# Patient Record
Sex: Female | Born: 1935
Health system: Southern US, Community
[De-identification: ages and names within clinical notes are randomized; demographics above are authoritative.]

## PROBLEM LIST (undated history)

## (undated) DIAGNOSIS — F419 Anxiety disorder, unspecified: Secondary | ICD-10-CM

## (undated) DIAGNOSIS — E785 Hyperlipidemia, unspecified: Secondary | ICD-10-CM

## (undated) DIAGNOSIS — E039 Hypothyroidism, unspecified: Secondary | ICD-10-CM

## (undated) DIAGNOSIS — R002 Palpitations: Secondary | ICD-10-CM

## (undated) DIAGNOSIS — F0390 Unspecified dementia without behavioral disturbance: Secondary | ICD-10-CM

## (undated) HISTORY — DX: Palpitations: R00.2

## (undated) HISTORY — PX: ABDOMINAL HYSTERECTOMY: SHX81

## (undated) HISTORY — PX: APPENDECTOMY: SHX54

## (undated) HISTORY — PX: CHOLECYSTECTOMY: SHX55

## (undated) HISTORY — DX: Hyperlipidemia, unspecified: E78.5

## (undated) HISTORY — PX: PARTIAL HYSTERECTOMY: SHX80

## (undated) HISTORY — DX: Hypothyroidism, unspecified: E03.9

## (undated) HISTORY — DX: Anxiety disorder, unspecified: F41.9

---

## 2000-11-27 ENCOUNTER — Other Ambulatory Visit: Admission: RE | Admit: 2000-11-27 | Discharge: 2000-11-27 | Payer: Self-pay | Admitting: Dermatology

## 2000-12-07 ENCOUNTER — Encounter: Payer: Self-pay | Admitting: Internal Medicine

## 2000-12-07 ENCOUNTER — Ambulatory Visit (HOSPITAL_COMMUNITY): Admission: RE | Admit: 2000-12-07 | Discharge: 2000-12-07 | Payer: Self-pay | Admitting: Internal Medicine

## 2001-06-21 ENCOUNTER — Other Ambulatory Visit: Admission: RE | Admit: 2001-06-21 | Discharge: 2001-06-21 | Payer: Self-pay | Admitting: Family Medicine

## 2001-07-27 ENCOUNTER — Ambulatory Visit (HOSPITAL_COMMUNITY): Admission: RE | Admit: 2001-07-27 | Discharge: 2001-07-27 | Payer: Self-pay | Admitting: Family Medicine

## 2001-07-27 ENCOUNTER — Encounter: Payer: Self-pay | Admitting: Family Medicine

## 2001-08-16 ENCOUNTER — Encounter (HOSPITAL_COMMUNITY): Admission: RE | Admit: 2001-08-16 | Discharge: 2001-09-15 | Payer: Self-pay | Admitting: Rheumatology

## 2002-02-28 ENCOUNTER — Encounter (HOSPITAL_COMMUNITY): Admission: RE | Admit: 2002-02-28 | Discharge: 2002-03-30 | Payer: Self-pay | Admitting: *Deleted

## 2002-02-28 ENCOUNTER — Encounter: Payer: Self-pay | Admitting: *Deleted

## 2002-03-21 ENCOUNTER — Encounter (HOSPITAL_COMMUNITY): Admission: RE | Admit: 2002-03-21 | Discharge: 2002-04-20 | Payer: Self-pay | Admitting: Rheumatology

## 2002-05-14 ENCOUNTER — Encounter (HOSPITAL_COMMUNITY): Admission: RE | Admit: 2002-05-14 | Discharge: 2002-06-13 | Payer: Self-pay | Admitting: Orthopedic Surgery

## 2002-07-31 ENCOUNTER — Encounter: Payer: Self-pay | Admitting: Family Medicine

## 2002-07-31 ENCOUNTER — Ambulatory Visit (HOSPITAL_COMMUNITY): Admission: RE | Admit: 2002-07-31 | Discharge: 2002-07-31 | Payer: Self-pay | Admitting: Family Medicine

## 2003-02-21 ENCOUNTER — Encounter: Payer: Self-pay | Admitting: Family Medicine

## 2003-02-21 ENCOUNTER — Ambulatory Visit (HOSPITAL_COMMUNITY): Admission: RE | Admit: 2003-02-21 | Discharge: 2003-02-21 | Payer: Self-pay | Admitting: Family Medicine

## 2003-08-05 ENCOUNTER — Ambulatory Visit (HOSPITAL_COMMUNITY): Admission: RE | Admit: 2003-08-05 | Discharge: 2003-08-05 | Payer: Self-pay | Admitting: Family Medicine

## 2004-03-30 ENCOUNTER — Ambulatory Visit: Payer: Self-pay | Admitting: Family Medicine

## 2004-04-21 ENCOUNTER — Ambulatory Visit: Payer: Self-pay | Admitting: Family Medicine

## 2004-06-04 ENCOUNTER — Ambulatory Visit (HOSPITAL_COMMUNITY): Admission: RE | Admit: 2004-06-04 | Discharge: 2004-06-04 | Payer: Self-pay | Admitting: Family Medicine

## 2004-07-20 ENCOUNTER — Ambulatory Visit: Payer: Self-pay | Admitting: Family Medicine

## 2004-08-05 ENCOUNTER — Ambulatory Visit (HOSPITAL_COMMUNITY): Admission: RE | Admit: 2004-08-05 | Discharge: 2004-08-05 | Payer: Self-pay | Admitting: Family Medicine

## 2004-09-29 ENCOUNTER — Ambulatory Visit: Payer: Self-pay | Admitting: Family Medicine

## 2004-10-20 ENCOUNTER — Ambulatory Visit: Payer: Self-pay | Admitting: Family Medicine

## 2004-11-23 ENCOUNTER — Ambulatory Visit (HOSPITAL_COMMUNITY): Admission: RE | Admit: 2004-11-23 | Discharge: 2004-11-23 | Payer: Self-pay | Admitting: Family Medicine

## 2005-02-21 ENCOUNTER — Ambulatory Visit: Payer: Self-pay | Admitting: Family Medicine

## 2005-02-24 ENCOUNTER — Ambulatory Visit: Payer: Self-pay | Admitting: Family Medicine

## 2005-05-23 ENCOUNTER — Ambulatory Visit: Payer: Self-pay | Admitting: Family Medicine

## 2005-05-31 ENCOUNTER — Ambulatory Visit (HOSPITAL_COMMUNITY): Admission: RE | Admit: 2005-05-31 | Discharge: 2005-05-31 | Payer: Self-pay | Admitting: Family Medicine

## 2005-06-13 ENCOUNTER — Ambulatory Visit (HOSPITAL_COMMUNITY): Admission: RE | Admit: 2005-06-13 | Discharge: 2005-06-13 | Payer: Self-pay | Admitting: Unknown Physician Specialty

## 2005-06-17 ENCOUNTER — Ambulatory Visit: Payer: Self-pay | Admitting: Cardiology

## 2005-06-23 ENCOUNTER — Ambulatory Visit: Payer: Self-pay | Admitting: Family Medicine

## 2005-08-05 ENCOUNTER — Ambulatory Visit (HOSPITAL_COMMUNITY): Admission: RE | Admit: 2005-08-05 | Discharge: 2005-08-05 | Payer: Self-pay | Admitting: Family Medicine

## 2005-09-22 ENCOUNTER — Ambulatory Visit: Payer: Self-pay | Admitting: Family Medicine

## 2005-11-09 ENCOUNTER — Ambulatory Visit: Payer: Self-pay | Admitting: Family Medicine

## 2006-01-03 ENCOUNTER — Ambulatory Visit: Payer: Self-pay | Admitting: Family Medicine

## 2006-01-04 ENCOUNTER — Ambulatory Visit (HOSPITAL_COMMUNITY): Admission: RE | Admit: 2006-01-04 | Discharge: 2006-01-04 | Payer: Self-pay | Admitting: Family Medicine

## 2006-01-30 ENCOUNTER — Ambulatory Visit: Payer: Self-pay | Admitting: Family Medicine

## 2006-02-20 ENCOUNTER — Ambulatory Visit: Payer: Self-pay | Admitting: Family Medicine

## 2006-04-26 ENCOUNTER — Other Ambulatory Visit: Admission: RE | Admit: 2006-04-26 | Discharge: 2006-04-26 | Payer: Self-pay | Admitting: Family Medicine

## 2006-04-26 ENCOUNTER — Encounter: Payer: Self-pay | Admitting: Family Medicine

## 2006-04-26 ENCOUNTER — Ambulatory Visit: Payer: Self-pay | Admitting: Family Medicine

## 2006-06-23 ENCOUNTER — Encounter: Payer: Self-pay | Admitting: Family Medicine

## 2006-06-23 LAB — CONVERTED CEMR LAB
ALT: 12 units/L (ref 0–35)
AST: 21 units/L (ref 0–37)
Albumin: 4 g/dL (ref 3.5–5.2)
Alkaline Phosphatase: 117 units/L (ref 39–117)
BUN: 10 mg/dL (ref 6–23)
Basophils Absolute: 0 10*3/uL (ref 0.0–0.1)
Basophils Relative: 1 % (ref 0–1)
Bilirubin, Direct: 0.1 mg/dL (ref 0.0–0.3)
CO2: 27 meq/L (ref 19–32)
Calcium: 9.7 mg/dL (ref 8.4–10.5)
Chloride: 105 meq/L (ref 96–112)
Cholesterol: 195 mg/dL (ref 0–200)
Creatinine, Ser: 0.67 mg/dL (ref 0.40–1.20)
Eosinophils Absolute: 0.2 10*3/uL (ref 0.0–0.7)
Eosinophils Relative: 7 % — ABNORMAL HIGH (ref 0–5)
Glucose, Bld: 81 mg/dL (ref 70–99)
HCT: 43 % (ref 36.0–46.0)
HDL: 80 mg/dL (ref >39)
Hemoglobin: 13.8 g/dL (ref 12.0–15.0)
Indirect Bilirubin: 0.6 mg/dL (ref 0.0–0.9)
LDL Cholesterol: 102 mg/dL — ABNORMAL HIGH (ref 0–99)
Lymphocytes Relative: 45 % (ref 12–46)
Lymphs Abs: 1.6 10*3/uL (ref 0.7–3.3)
MCHC: 32.1 g/dL (ref 30.0–36.0)
MCV: 90 fL (ref 78.0–100.0)
Monocytes Absolute: 0.3 10*3/uL (ref 0.2–0.7)
Monocytes Relative: 10 % (ref 3–11)
Neutro Abs: 1.3 10*3/uL — ABNORMAL LOW (ref 1.7–7.7)
Neutrophils Relative %: 37 % — ABNORMAL LOW (ref 43–77)
Platelets: 237 10*3/uL (ref 150–400)
Potassium: 4.6 meq/L (ref 3.5–5.3)
RBC: 4.78 M/uL (ref 3.87–5.11)
RDW: 12.9 % (ref 11.5–14.0)
Sodium: 140 meq/L (ref 135–145)
TSH: 3.424 microintl units/mL (ref 0.350–5.50)
Total Bilirubin: 0.7 mg/dL (ref 0.3–1.2)
Total CHOL/HDL Ratio: 2.4
Total Protein: 6.3 g/dL (ref 6.0–8.3)
Triglycerides: 66 mg/dL (ref <150)
VLDL: 13 mg/dL (ref 0–40)
WBC: 3.5 10*3/uL — ABNORMAL LOW (ref 4.0–10.5)

## 2006-07-07 ENCOUNTER — Ambulatory Visit (HOSPITAL_COMMUNITY): Admission: RE | Admit: 2006-07-07 | Discharge: 2006-07-07 | Payer: Self-pay | Admitting: Family Medicine

## 2006-07-12 ENCOUNTER — Ambulatory Visit: Payer: Self-pay | Admitting: Family Medicine

## 2006-08-10 ENCOUNTER — Ambulatory Visit (HOSPITAL_COMMUNITY): Admission: RE | Admit: 2006-08-10 | Discharge: 2006-08-10 | Payer: Self-pay | Admitting: Family Medicine

## 2006-08-11 ENCOUNTER — Ambulatory Visit: Payer: Self-pay | Admitting: Family Medicine

## 2006-10-30 ENCOUNTER — Encounter: Payer: Self-pay | Admitting: Family Medicine

## 2006-10-30 LAB — CONVERTED CEMR LAB: TSH: 3.403 microintl units/mL (ref 0.350–5.50)

## 2006-11-23 ENCOUNTER — Ambulatory Visit: Payer: Self-pay | Admitting: Family Medicine

## 2007-02-14 ENCOUNTER — Ambulatory Visit: Payer: Self-pay | Admitting: Family Medicine

## 2007-03-01 ENCOUNTER — Encounter: Payer: Self-pay | Admitting: Family Medicine

## 2007-03-01 LAB — CONVERTED CEMR LAB: TSH: 3.952 microintl units/mL (ref 0.350–5.50)

## 2007-03-12 ENCOUNTER — Ambulatory Visit: Payer: Self-pay | Admitting: Family Medicine

## 2007-03-12 LAB — CONVERTED CEMR LAB
Basophils Absolute: 0 10*3/uL (ref 0.0–0.1)
CO2: 34 meq/L — ABNORMAL HIGH (ref 19–32)
Calcium: 9.5 mg/dL (ref 8.4–10.5)
Chloride: 102 meq/L (ref 96–112)
Eosinophils Relative: 2 % (ref 0–5)
Glucose, Bld: 94 mg/dL (ref 70–99)
HCT: 41.1 % (ref 36.0–46.0)
Hemoglobin: 13.7 g/dL (ref 12.0–15.0)
Lymphocytes Relative: 32 % (ref 12–46)
Lymphs Abs: 1.6 10*3/uL (ref 0.7–3.3)
Monocytes Absolute: 0.4 10*3/uL (ref 0.2–0.7)
Neutro Abs: 3 10*3/uL (ref 1.7–7.7)
Platelets: 213 10*3/uL (ref 150–400)
RDW: 13 % (ref 11.5–14.0)
Sodium: 141 meq/L (ref 135–145)
WBC: 5.2 10*3/uL (ref 4.0–10.5)

## 2007-03-13 ENCOUNTER — Encounter: Payer: Self-pay | Admitting: Family Medicine

## 2007-03-13 LAB — CONVERTED CEMR LAB
Bilirubin Urine: NEGATIVE
Leukocytes, UA: NEGATIVE
RBC / HPF: NONE SEEN (ref <3)
Urine Glucose: NEGATIVE mg/dL
WBC, UA: NONE SEEN cells/hpf (ref <3)
pH: 6 (ref 5.0–8.0)

## 2007-03-15 ENCOUNTER — Ambulatory Visit (HOSPITAL_COMMUNITY): Admission: RE | Admit: 2007-03-15 | Discharge: 2007-03-15 | Payer: Self-pay | Admitting: Family Medicine

## 2007-03-21 ENCOUNTER — Ambulatory Visit: Payer: Self-pay | Admitting: Family Medicine

## 2007-03-22 ENCOUNTER — Encounter: Payer: Self-pay | Admitting: Family Medicine

## 2007-04-24 ENCOUNTER — Encounter: Payer: Self-pay | Admitting: Family Medicine

## 2007-05-03 ENCOUNTER — Encounter: Payer: Self-pay | Admitting: Family Medicine

## 2007-05-03 ENCOUNTER — Other Ambulatory Visit: Admission: RE | Admit: 2007-05-03 | Discharge: 2007-05-03 | Payer: Self-pay | Admitting: Family Medicine

## 2007-05-03 ENCOUNTER — Ambulatory Visit: Payer: Self-pay | Admitting: Family Medicine

## 2007-05-03 LAB — CONVERTED CEMR LAB: Pap Smear: NORMAL

## 2007-07-03 ENCOUNTER — Encounter: Payer: Self-pay | Admitting: Family Medicine

## 2007-07-03 LAB — CONVERTED CEMR LAB
Calcium: 9.7 mg/dL (ref 8.4–10.5)
Cholesterol: 218 mg/dL — ABNORMAL HIGH (ref 0–200)
Creatinine, Ser: 0.65 mg/dL (ref 0.40–1.20)
HDL: 86 mg/dL (ref >39)
Sodium: 142 meq/L (ref 135–145)
TSH: 4.15 microintl units/mL (ref 0.350–5.50)
Total CHOL/HDL Ratio: 2.5
Triglycerides: 78 mg/dL (ref <150)

## 2007-07-11 ENCOUNTER — Ambulatory Visit: Payer: Self-pay | Admitting: Family Medicine

## 2007-07-13 ENCOUNTER — Ambulatory Visit (HOSPITAL_COMMUNITY): Admission: RE | Admit: 2007-07-13 | Discharge: 2007-07-13 | Payer: Self-pay | Admitting: Family Medicine

## 2007-08-15 ENCOUNTER — Ambulatory Visit (HOSPITAL_COMMUNITY): Admission: RE | Admit: 2007-08-15 | Discharge: 2007-08-15 | Payer: Self-pay | Admitting: Family Medicine

## 2007-10-31 ENCOUNTER — Encounter: Payer: Self-pay | Admitting: Family Medicine

## 2007-10-31 LAB — CONVERTED CEMR LAB
HDL: 79 mg/dL (ref >39)
LDL Cholesterol: 105 mg/dL — ABNORMAL HIGH (ref 0–99)
Total CHOL/HDL Ratio: 2.6
VLDL: 18 mg/dL (ref 0–40)

## 2007-11-07 DIAGNOSIS — E785 Hyperlipidemia, unspecified: Secondary | ICD-10-CM | POA: Insufficient documentation

## 2007-11-07 DIAGNOSIS — F411 Generalized anxiety disorder: Secondary | ICD-10-CM | POA: Insufficient documentation

## 2007-11-07 DIAGNOSIS — E039 Hypothyroidism, unspecified: Secondary | ICD-10-CM

## 2007-11-08 ENCOUNTER — Ambulatory Visit: Payer: Self-pay | Admitting: Family Medicine

## 2007-12-17 ENCOUNTER — Encounter: Payer: Self-pay | Admitting: Family Medicine

## 2007-12-24 ENCOUNTER — Encounter: Payer: Self-pay | Admitting: Family Medicine

## 2008-01-24 ENCOUNTER — Encounter: Payer: Self-pay | Admitting: Family Medicine

## 2008-01-30 ENCOUNTER — Ambulatory Visit: Payer: Self-pay | Admitting: Family Medicine

## 2008-01-30 DIAGNOSIS — N3090 Cystitis, unspecified without hematuria: Secondary | ICD-10-CM | POA: Insufficient documentation

## 2008-01-30 DIAGNOSIS — N390 Urinary tract infection, site not specified: Secondary | ICD-10-CM

## 2008-01-30 LAB — CONVERTED CEMR LAB
Bilirubin Urine: NEGATIVE
Glucose, Urine, Semiquant: NEGATIVE
Protein, U semiquant: NEGATIVE
pH: 7

## 2008-01-31 ENCOUNTER — Encounter: Payer: Self-pay | Admitting: Family Medicine

## 2008-01-31 LAB — CONVERTED CEMR LAB
Basophils Absolute: 0 10*3/uL (ref 0.0–0.1)
Eosinophils Absolute: 0.3 10*3/uL (ref 0.0–0.7)
Eosinophils Relative: 5 % (ref 0–5)
HCT: 42.2 % (ref 36.0–46.0)
Lymphocytes Relative: 31 % (ref 12–46)
Neutrophils Relative %: 57 % (ref 43–77)
Platelets: 215 10*3/uL (ref 150–400)
RDW: 13.1 % (ref 11.5–15.5)
TSH: 3.63 microintl units/mL (ref 0.350–4.50)

## 2008-02-29 ENCOUNTER — Telehealth: Payer: Self-pay | Admitting: Family Medicine

## 2008-03-20 ENCOUNTER — Ambulatory Visit: Payer: Self-pay | Admitting: Family Medicine

## 2008-03-20 DIAGNOSIS — R109 Unspecified abdominal pain: Secondary | ICD-10-CM | POA: Insufficient documentation

## 2008-03-20 LAB — CONVERTED CEMR LAB
Glucose, Urine, Semiquant: NEGATIVE
Nitrite: NEGATIVE
Specific Gravity, Urine: 1.02
WBC Urine, dipstick: NEGATIVE

## 2008-03-21 ENCOUNTER — Encounter: Payer: Self-pay | Admitting: Family Medicine

## 2008-03-21 LAB — CONVERTED CEMR LAB
Bilirubin Urine: NEGATIVE
Hemoglobin, Urine: NEGATIVE
Ketones, ur: NEGATIVE mg/dL
Protein, ur: NEGATIVE mg/dL
Urine Glucose: NEGATIVE mg/dL

## 2008-03-25 ENCOUNTER — Ambulatory Visit (HOSPITAL_COMMUNITY): Admission: RE | Admit: 2008-03-25 | Discharge: 2008-03-25 | Payer: Self-pay | Admitting: Family Medicine

## 2008-03-31 ENCOUNTER — Ambulatory Visit: Payer: Self-pay | Admitting: Family Medicine

## 2008-03-31 DIAGNOSIS — M549 Dorsalgia, unspecified: Secondary | ICD-10-CM | POA: Insufficient documentation

## 2008-04-23 ENCOUNTER — Encounter: Payer: Self-pay | Admitting: Family Medicine

## 2008-04-24 ENCOUNTER — Telehealth: Payer: Self-pay | Admitting: Family Medicine

## 2008-06-09 ENCOUNTER — Ambulatory Visit: Payer: Self-pay | Admitting: Family Medicine

## 2008-06-09 ENCOUNTER — Other Ambulatory Visit: Admission: RE | Admit: 2008-06-09 | Discharge: 2008-06-09 | Payer: Self-pay | Admitting: Family Medicine

## 2008-06-09 ENCOUNTER — Encounter: Payer: Self-pay | Admitting: Family Medicine

## 2008-06-09 DIAGNOSIS — N760 Acute vaginitis: Secondary | ICD-10-CM | POA: Insufficient documentation

## 2008-06-09 DIAGNOSIS — R5383 Other fatigue: Secondary | ICD-10-CM | POA: Insufficient documentation

## 2008-06-09 DIAGNOSIS — E559 Vitamin D deficiency, unspecified: Secondary | ICD-10-CM | POA: Insufficient documentation

## 2008-06-09 LAB — CONVERTED CEMR LAB
ALT: 16 units/L (ref 0–35)
AST: 23 units/L (ref 0–37)
Bilirubin, Direct: 0.1 mg/dL (ref 0.0–0.3)
Calcium: 9.5 mg/dL (ref 8.4–10.5)
Cholesterol: 228 mg/dL — ABNORMAL HIGH (ref 0–200)
Glucose, Bld: 91 mg/dL (ref 70–99)
Ketones, urine, test strip: NEGATIVE
Nitrite: NEGATIVE
Protein, U semiquant: NEGATIVE
Sodium: 145 meq/L (ref 135–145)
TSH: 4.324 microintl units/mL (ref 0.350–4.50)
Total CHOL/HDL Ratio: 2.4
Urobilinogen, UA: 0.2
pH: 7

## 2008-06-10 ENCOUNTER — Encounter: Payer: Self-pay | Admitting: Family Medicine

## 2008-06-10 LAB — CONVERTED CEMR LAB
Candida species: NEGATIVE
GC Probe Amp, Genital: NEGATIVE
Gardnerella vaginalis: NEGATIVE

## 2008-07-28 ENCOUNTER — Ambulatory Visit: Payer: Self-pay | Admitting: Family Medicine

## 2008-08-18 ENCOUNTER — Ambulatory Visit (HOSPITAL_COMMUNITY): Admission: RE | Admit: 2008-08-18 | Discharge: 2008-08-18 | Payer: Self-pay | Admitting: Family Medicine

## 2008-10-23 ENCOUNTER — Encounter: Payer: Self-pay | Admitting: Family Medicine

## 2008-10-24 ENCOUNTER — Encounter: Payer: Self-pay | Admitting: Family Medicine

## 2008-10-24 LAB — CONVERTED CEMR LAB
HDL: 93 mg/dL (ref >39)
LDL Cholesterol: 96 mg/dL (ref 0–99)
TSH: 3.329 microintl units/mL (ref 0.350–4.500)
Total CHOL/HDL Ratio: 2.2
VLDL: 11 mg/dL (ref 0–40)

## 2008-10-27 ENCOUNTER — Encounter: Payer: Self-pay | Admitting: Family Medicine

## 2008-10-31 ENCOUNTER — Ambulatory Visit: Payer: Self-pay | Admitting: Family Medicine

## 2008-10-31 DIAGNOSIS — M79609 Pain in unspecified limb: Secondary | ICD-10-CM

## 2009-01-05 ENCOUNTER — Ambulatory Visit: Payer: Self-pay | Admitting: Family Medicine

## 2009-01-05 ENCOUNTER — Ambulatory Visit (HOSPITAL_COMMUNITY): Admission: RE | Admit: 2009-01-05 | Discharge: 2009-01-05 | Payer: Self-pay | Admitting: Family Medicine

## 2009-01-05 ENCOUNTER — Telehealth: Payer: Self-pay | Admitting: Family Medicine

## 2009-01-05 DIAGNOSIS — M542 Cervicalgia: Secondary | ICD-10-CM

## 2009-01-18 ENCOUNTER — Encounter: Payer: Self-pay | Admitting: Family Medicine

## 2009-01-20 ENCOUNTER — Encounter: Payer: Self-pay | Admitting: Family Medicine

## 2009-03-06 ENCOUNTER — Ambulatory Visit: Payer: Self-pay | Admitting: Family Medicine

## 2009-03-09 ENCOUNTER — Encounter: Payer: Self-pay | Admitting: Family Medicine

## 2009-03-09 LAB — CONVERTED CEMR LAB
Lymphocytes Relative: 37 % (ref 12–46)
Neutro Abs: 2.4 10*3/uL (ref 1.7–7.7)
Neutrophils Relative %: 49 % (ref 43–77)
Platelets: 223 10*3/uL (ref 150–400)
RDW: 13.7 % (ref 11.5–15.5)

## 2009-04-29 ENCOUNTER — Encounter: Payer: Self-pay | Admitting: Family Medicine

## 2009-05-25 ENCOUNTER — Encounter: Payer: Self-pay | Admitting: Family Medicine

## 2009-06-02 ENCOUNTER — Ambulatory Visit: Payer: Self-pay | Admitting: Family Medicine

## 2009-06-02 LAB — CONVERTED CEMR LAB
Bilirubin Urine: NEGATIVE
Glucose, Urine, Semiquant: NEGATIVE
Ketones, urine, test strip: NEGATIVE
Protein, U semiquant: NEGATIVE

## 2009-06-03 ENCOUNTER — Encounter: Payer: Self-pay | Admitting: Family Medicine

## 2009-07-08 ENCOUNTER — Ambulatory Visit: Payer: Self-pay | Admitting: Family Medicine

## 2009-07-10 LAB — CONVERTED CEMR LAB
CO2: 26 meq/L (ref 19–32)
Calcium: 9.8 mg/dL (ref 8.4–10.5)
HDL: 90 mg/dL (ref >39)
Potassium: 4.1 meq/L (ref 3.5–5.3)
Sodium: 141 meq/L (ref 135–145)
TSH: 4.111 microintl units/mL (ref 0.350–4.500)
Total CHOL/HDL Ratio: 2.5
VLDL: 19 mg/dL (ref 0–40)

## 2009-08-14 ENCOUNTER — Ambulatory Visit: Payer: Self-pay | Admitting: Family Medicine

## 2009-08-14 DIAGNOSIS — I491 Atrial premature depolarization: Secondary | ICD-10-CM

## 2009-08-14 DIAGNOSIS — J019 Acute sinusitis, unspecified: Secondary | ICD-10-CM | POA: Insufficient documentation

## 2009-08-25 ENCOUNTER — Ambulatory Visit (HOSPITAL_COMMUNITY): Admission: RE | Admit: 2009-08-25 | Discharge: 2009-08-25 | Payer: Self-pay | Admitting: Family Medicine

## 2009-09-10 ENCOUNTER — Telehealth: Payer: Self-pay | Admitting: Family Medicine

## 2009-09-10 ENCOUNTER — Ambulatory Visit: Payer: Self-pay | Admitting: Family Medicine

## 2009-09-10 DIAGNOSIS — R079 Chest pain, unspecified: Secondary | ICD-10-CM

## 2009-09-11 LAB — CONVERTED CEMR LAB
Basophils Absolute: 0 10*3/uL (ref 0.0–0.1)
Basophils Relative: 1 % (ref 0–1)
Calcium: 9.2 mg/dL (ref 8.4–10.5)
Cholesterol: 200 mg/dL (ref 0–200)
Glucose, Bld: 86 mg/dL (ref 70–99)
HDL: 91 mg/dL (ref >39)
Hemoglobin: 13.3 g/dL (ref 12.0–15.0)
Lymphocytes Relative: 36 % (ref 12–46)
MCHC: 32.4 g/dL (ref 30.0–36.0)
Monocytes Absolute: 0.3 10*3/uL (ref 0.1–1.0)
Neutro Abs: 1.8 10*3/uL (ref 1.7–7.7)
Neutrophils Relative %: 47 % (ref 43–77)
Platelets: 186 10*3/uL (ref 150–400)
Potassium: 4 meq/L (ref 3.5–5.3)
RDW: 13.4 % (ref 11.5–15.5)
Sodium: 142 meq/L (ref 135–145)
Total CHOL/HDL Ratio: 2.2
Triglycerides: 61 mg/dL (ref <150)
VLDL: 12 mg/dL (ref 0–40)

## 2009-09-13 DIAGNOSIS — M940 Chondrocostal junction syndrome [Tietze]: Secondary | ICD-10-CM | POA: Insufficient documentation

## 2009-09-16 ENCOUNTER — Telehealth: Payer: Self-pay | Admitting: Family Medicine

## 2009-09-30 ENCOUNTER — Ambulatory Visit: Payer: Self-pay | Admitting: Family Medicine

## 2009-09-30 DIAGNOSIS — R05 Cough: Secondary | ICD-10-CM

## 2009-09-30 DIAGNOSIS — J309 Allergic rhinitis, unspecified: Secondary | ICD-10-CM

## 2009-10-09 ENCOUNTER — Ambulatory Visit: Payer: Self-pay | Admitting: Internal Medicine

## 2009-10-09 DIAGNOSIS — R011 Cardiac murmur, unspecified: Secondary | ICD-10-CM

## 2009-10-14 ENCOUNTER — Ambulatory Visit: Payer: Self-pay | Admitting: Family Medicine

## 2009-10-14 DIAGNOSIS — J209 Acute bronchitis, unspecified: Secondary | ICD-10-CM

## 2009-10-19 ENCOUNTER — Ambulatory Visit: Payer: Self-pay | Admitting: Cardiology

## 2009-10-19 ENCOUNTER — Ambulatory Visit (HOSPITAL_COMMUNITY): Admission: RE | Admit: 2009-10-19 | Discharge: 2009-10-19 | Payer: Self-pay | Admitting: Internal Medicine

## 2009-10-20 ENCOUNTER — Ambulatory Visit: Payer: Self-pay | Admitting: Cardiology

## 2009-10-27 ENCOUNTER — Telehealth: Payer: Self-pay | Admitting: Family Medicine

## 2009-10-28 ENCOUNTER — Ambulatory Visit: Payer: Self-pay | Admitting: Family Medicine

## 2009-10-28 DIAGNOSIS — J328 Other chronic sinusitis: Secondary | ICD-10-CM

## 2009-10-29 ENCOUNTER — Ambulatory Visit (HOSPITAL_COMMUNITY): Admission: RE | Admit: 2009-10-29 | Discharge: 2009-10-29 | Payer: Self-pay | Admitting: Family Medicine

## 2009-10-30 ENCOUNTER — Encounter: Payer: Self-pay | Admitting: Cardiology

## 2009-11-04 ENCOUNTER — Telehealth: Payer: Self-pay | Admitting: Family Medicine

## 2009-11-04 ENCOUNTER — Encounter: Payer: Self-pay | Admitting: Family Medicine

## 2009-11-19 ENCOUNTER — Encounter: Payer: Self-pay | Admitting: Family Medicine

## 2009-12-16 ENCOUNTER — Encounter: Payer: Self-pay | Admitting: Family Medicine

## 2010-02-11 ENCOUNTER — Encounter: Payer: Self-pay | Admitting: Family Medicine

## 2010-02-25 ENCOUNTER — Ambulatory Visit: Payer: Self-pay | Admitting: Family Medicine

## 2010-02-25 DIAGNOSIS — M25569 Pain in unspecified knee: Secondary | ICD-10-CM

## 2010-03-02 ENCOUNTER — Encounter: Payer: Self-pay | Admitting: Family Medicine

## 2010-03-05 ENCOUNTER — Ambulatory Visit: Payer: Self-pay | Admitting: Family Medicine

## 2010-03-05 ENCOUNTER — Telehealth: Payer: Self-pay | Admitting: Family Medicine

## 2010-03-05 DIAGNOSIS — N3000 Acute cystitis without hematuria: Secondary | ICD-10-CM

## 2010-03-05 LAB — CONVERTED CEMR LAB
Nitrite: NEGATIVE
Protein, U semiquant: 100
Specific Gravity, Urine: 1.015
pH: 6.5

## 2010-03-06 ENCOUNTER — Encounter: Payer: Self-pay | Admitting: Family Medicine

## 2010-03-19 ENCOUNTER — Ambulatory Visit: Payer: Self-pay | Admitting: Family Medicine

## 2010-03-19 ENCOUNTER — Ambulatory Visit (HOSPITAL_COMMUNITY)
Admission: RE | Admit: 2010-03-19 | Discharge: 2010-03-19 | Payer: Self-pay | Source: Home / Self Care | Admitting: Family Medicine

## 2010-03-19 LAB — CONVERTED CEMR LAB
BUN: 7 mg/dL (ref 6–23)
Basophils Relative: 1 % (ref 0–1)
Calcium: 9.8 mg/dL (ref 8.4–10.5)
Eosinophils Absolute: 0.6 10*3/uL (ref 0.0–0.7)
Eosinophils Relative: 12 % — ABNORMAL HIGH (ref 0–5)
Glucose, Bld: 97 mg/dL (ref 70–99)
HCT: 44.5 % (ref 36.0–46.0)
Lymphs Abs: 1.4 10*3/uL (ref 0.7–4.0)
MCHC: 33.1 g/dL (ref 30.0–36.0)
MCV: 90.1 fL (ref 78.0–100.0)
Monocytes Relative: 9 % (ref 3–12)
Neutrophils Relative %: 47 % (ref 43–77)
Platelets: 226 10*3/uL (ref 150–400)
TSH: 2.674 microintl units/mL (ref 0.350–4.500)
Vit D, 25-Hydroxy: 27 ng/mL — ABNORMAL LOW (ref 30–89)
WBC: 4.6 10*3/uL (ref 4.0–10.5)

## 2010-03-23 ENCOUNTER — Telehealth: Payer: Self-pay | Admitting: Family Medicine

## 2010-04-23 ENCOUNTER — Ambulatory Visit: Payer: Self-pay | Admitting: Family Medicine

## 2010-04-23 LAB — CONVERTED CEMR LAB
Nitrite: NEGATIVE
Specific Gravity, Urine: 1.025
Urobilinogen, UA: 0.2
WBC Urine, dipstick: NEGATIVE

## 2010-04-24 ENCOUNTER — Encounter: Payer: Self-pay | Admitting: Family Medicine

## 2010-05-12 ENCOUNTER — Ambulatory Visit
Admission: RE | Admit: 2010-05-12 | Discharge: 2010-05-12 | Payer: Self-pay | Source: Home / Self Care | Attending: Family Medicine | Admitting: Family Medicine

## 2010-05-12 ENCOUNTER — Ambulatory Visit (HOSPITAL_COMMUNITY)
Admission: RE | Admit: 2010-05-12 | Discharge: 2010-05-12 | Payer: Self-pay | Source: Home / Self Care | Attending: Family Medicine | Admitting: Family Medicine

## 2010-05-12 DIAGNOSIS — J42 Unspecified chronic bronchitis: Secondary | ICD-10-CM

## 2010-05-13 ENCOUNTER — Telehealth: Payer: Self-pay | Admitting: Family Medicine

## 2010-05-18 ENCOUNTER — Encounter: Payer: Self-pay | Admitting: Family Medicine

## 2010-05-19 ENCOUNTER — Telehealth: Payer: Self-pay | Admitting: Family Medicine

## 2010-05-28 ENCOUNTER — Telehealth (INDEPENDENT_AMBULATORY_CARE_PROVIDER_SITE_OTHER): Payer: Self-pay | Admitting: *Deleted

## 2010-05-31 ENCOUNTER — Encounter: Payer: Self-pay | Admitting: Family Medicine

## 2010-06-06 ENCOUNTER — Encounter: Payer: Self-pay | Admitting: Family Medicine

## 2010-06-06 ENCOUNTER — Encounter: Payer: Self-pay | Admitting: Cardiology

## 2010-06-15 NOTE — Assessment & Plan Note (Signed)
Summary: chest pains   Vital Signs:  Patient profile:   76 year old female Menstrual status:  hysterectomy Height:      65 inches Weight:      148.50 pounds BMI:     24.80 O2 Sat:      98 % Pulse rate:   79 / minute Resp:     16 per minute BP sitting:   122 / 72  (left arm) Cuff size:   regular  Vitals Entered By: Everitt Amber LPN (September 10, 2009 10:35 AM) CC: last night she was congested in her chest and this morning she was still stuffy and congested and she felt like her heart wasn't beating as fast as it should be. Her pulse today is 79   CC:  last night she was congested in her chest and this morning she was still stuffy and congested and she felt like her heart wasn't beating as fast as it should be. Her pulse today is 79.  History of Present Illness: Pt repors that she experienced substernal chest tightness, last night while lying in the bed , associated with sweating, she is still experiencing chest tight ness. She has no other associated symptoms .she specifically denies diaphoresis, , light headedness or nausea. There is no exertional trigger, but she does note increased exertional fatigue in recent times, which reuires that she restwhile doing her usual household chores, her spouse also notices this. She has had an irregular heartbeat for some time butis asymptomatic for the most part.  She is also reporting reproducible chest pain. She ahs recently beenunder increased stress with the new diagnosis of prostate cancer in her spouse, howevere , she states she is handling it as well as can be expected. She has noted in recent times inc allergy symptoms of nasal congestion with clear drainage and tickle in the throat.She denies any fever or chills, yellow green nasal drainage or productive cough.  Current Medications (verified): 1)  Bayer Low Strength 81 Mg  Tbec (Aspirin) .... Take 1 Tablet By Mouth Once A Day 2)  Levothyroxine Sodium 25 Mcg  Tabs (Levothyroxine Sodium) ....  Take 1 Tablet By Mouth Once A Day 3)  Calcium 500/d 500-125 Mg-Unit  Tabs (Calcium Carbonate-Vitamin D) .... Take 1 Tablet By Mouth Three Times A Day 4)  Multivitamins   Tabs (Multiple Vitamin) .... Take 1 Tablet By Mouth Once A Day 5)  Darpaz 81 Mg Tabs (Meth-Hyo-M Bl-Na Phos-Ph Sal) .... One Tab By Mouth Qid Prn 6)  Ibuprofen 800 Mg Tabs (Ibuprofen) .... Take 1 Tablet By Mouth Two Times A Day 7)  Vitamin D (Ergocalciferol) 50000 Unit Caps (Ergocalciferol) .... One Tab By Mouth Q Week 8)  Lorazepam 1 Mg Tabs (Lorazepam) .... One and Half Tab By Mouth Once Daily As Needed  Allergies (verified): 1)  ! Pcn  Review of Systems      See HPI General:  Complains of fatigue and sweats. Eyes:  Denies discharge, eye pain, and red eye. ENT:  Complains of nasal congestion and postnasal drainage; denies sinus pressure. CV:  Complains of chest pain or discomfort, leg cramps with exertion, and shortness of breath with exertion; denies difficulty breathing while lying down, lightheadness, near fainting, and swelling of feet. Resp:  Denies cough and sputum productive. GI:  Denies abdominal pain, constipation, diarrhea, nausea, and vomiting. GU:  Denies dysuria and urinary frequency. MS:  Complains of joint pain, low back pain, mid back pain, and stiffness. Derm:  Denies dryness. Neuro:  Denies headaches and seizures. Psych:  Complains of anxiety and irritability; denies depression, suicidal thoughts/plans, and thoughts of violence. Endo:  Denies cold intolerance, excessive thirst, polyuria, and weight change. Heme:  Denies abnormal bruising and bleeding. Allergy:  Complains of seasonal allergies; increased symptoms in the Spring.  Physical Exam  General:  Well-developed,well-nourished,in no acute distress; alert,appropriate and cooperative throughout examination HEENT: No facial asymmetry,  EOMI, No sinus tenderness, TM's Clear, oropharynx  pink and moist.   Chest: Clear to auscultation bilaterally.  reproducible chest wall pain at the 3rd , 4th and 5th cc junctions CVS: S1, S2, No murmurs, No S3.   Abd: Soft, Nontender.  MS: Adequate ROM spine, hips, shoulders and knees.  Ext: No edema.   CNS: CN 2-12 intact, power tone and sensation normal throughout.   Skin: Intact, no visible lesions or rashes.  Psych: Good eye contact, normal affect.  Memory intact, not anxious or depressed appearing.    Impression & Recommendations:  Problem # 1:  CHEST PAIN UNSPECIFIED (ICD-786.50) Assessment Comment Only  Orders: EKG w/ Interpretation (93000)NSabnormal t waves and ryrth, new exertional fatigue, refer to card T-Troponin I ([id number]) Cardiology Referral (Cardiology)  Problem # 2:  HYPOTHYROIDISM (ICD-244.9) Assessment: Comment Only  Her updated medication list for this problem includes:    Levothyroxine Sodium 25 Mcg Tabs (Levothyroxine sodium) .Marland Kitchen... Take 1 tablet by mouth once a day  Labs Reviewed: TSH: 4.111 (07/08/2009)    Chol: 227 (07/08/2009)   HDL: 90 (07/08/2009)   LDL: 118 (07/08/2009)   TG: 96 (07/08/2009)  Problem # 3:  DYSLIPIDEMIA (ICD-272.4) Assessment: Comment Only  Orders: T-Lipid Profile ([id number]) Cardiology Referral (Cardiology)  Labs Reviewed: SGOT: 23 (06/09/2008)   SGPT: 16 (06/09/2008)   HDL:90 (07/08/2009), 93 (10/23/2008)  LDL:118 (07/08/2009), 96 (96/29/5284)  Chol:227 (07/08/2009), 200 (10/23/2008)  Trig:96 (07/08/2009), 54 (10/23/2008)  Problem # 4:  ANXIETY, CHRONIC (ICD-300.00) Assessment: Deteriorated  Her updated medication list for this problem includes:    Lorazepam 1 Mg Tabs (Lorazepam) ..... One and half tab by mouth once daily as needed  Problem # 5:  COSTOCHONDRITIS (ICD-733.6) Assessment: Comment Only ibuprofen prescribed  Complete Medication List: 1)  Bayer Low Strength 81 Mg Tbec (Aspirin) .... Take 1 tablet by mouth once a day 2)  Levothyroxine Sodium 25 Mcg Tabs (Levothyroxine sodium) .... Take 1 tablet by mouth once a  day 3)  Calcium 500/d 500-125 Mg-unit Tabs (Calcium carbonate-vitamin d) .... Take 1 tablet by mouth three times a day 4)  Multivitamins Tabs (Multiple vitamin) .... Take 1 tablet by mouth once a day 5)  Darpaz 81 Mg Tabs (Meth-hyo-m bl-na phos-ph sal) .... One tab by mouth qid prn 6)  Ibuprofen 800 Mg Tabs (Ibuprofen) .... Take 1 tablet by mouth two times a day 7)  Vitamin D (ergocalciferol) 50000 Unit Caps (Ergocalciferol) .... One tab by mouth q week 8)  Lorazepam 1 Mg Tabs (Lorazepam) .... One and half tab by mouth once daily as needed 9)  Ibuprofen 600 Mg Tabs (Ibuprofen) .... Take 1 tablet by mouth three times a day  Other Orders: T-Basic Metabolic Panel [id number]) T-CBC w/Diff [phone])  Patient Instructions: 1)  Please schedule a follow-up appointment in 1 month. 2)  You will get  ibuprofen sent in for chest wall pain, labs today and an EKG will be done , 3)  You will be referred to cardiology, for further evaluation of increased fatigue with activity. 4)  BMP prior to visit, ICD-9: 5)  Lipid Panel prior  to visit, ICD-9: 6)  CBC w/ Diff prior to visit, ICD-9: 7)  troponin stat today Prescriptions: IBUPROFEN 600 MG TABS (IBUPROFEN) Take 1 tablet by mouth three times a day  #30 x 0   Entered and Authorized by:   Syliva Overman MD   Signed by:   Syliva Overman MD on 09/13/2009   Method used:   [facility] to        [facility] [phone]* (retail)       [address]       [place], [place]  [address]       Ph: [phone] or [phone]       Fax: [phone]   RxID:   [id number]

## 2010-06-15 NOTE — Assessment & Plan Note (Signed)
Summary: follow up 5 weeks/slj   Vital Signs:  Patient profile:   76 year old female Menstrual status:  hysterectomy Height:      65 inches Weight:      148 pounds BMI:     24.72 O2 Sat:      98 % on Room air Pulse rate:   71 / minute Pulse rhythm:   regular Resp:     16 per minute BP sitting:   124 / 70  (left arm)  Vitals Entered By: Adella Hare LPN (April 23, 2010 10:48 AM)  O2 Flow:  Room air CC: follow-up visit Is Patient Diabetic? No Pain Assessment Patient in pain? no        Primary Care Provider:  Syliva Overman MD  CC:  follow-up visit.  History of Present Illness: Reports  that she is feeling much better since her last visit. She states that the discussion with  me about dementia helped alot, and the med she is taking for her nerves is extremely beneficial  Denies recent fever or chills. Denies sinus pressure, nasal congestion , ear pain or sore throat. Denies chest congestion, or cough productive of sputum. Denies chest pain, palpitations, PND, orthopnea or leg swelling. Denies abdominal pain, nausea, vomitting, diarrhea or constipation. Denies change in bowel movements or bloody stool. Denies  joint pain, swelling, or reduced mobility. Denies headaches, vertigo, seizures. Denies depression, anxiety or insomnia. Denies  rash, lesions, or itch.     Current Medications (verified): 1)  Bayer Low Strength 81 Mg  Tbec (Aspirin) .... Take 1 Tablet By Mouth Once A Day 2)  Levothyroxine Sodium 25 Mcg  Tabs (Levothyroxine Sodium) .... Take 1 Tablet By Mouth Once A Day 3)  Calcium 500/d 500-125 Mg-Unit  Tabs (Calcium Carbonate-Vitamin D) .... Take 1 Tablet By Mouth Three Times A Day 4)  Multivitamins   Tabs (Multiple Vitamin) .... Take 1 Tablet By Mouth Once A Day 5)  Vitamin D (Ergocalciferol) 50000 Unit Caps (Ergocalciferol) .... One Tab By Mouth Q Week 6)  Lorazepam 1 Mg Tabs (Lorazepam) .... One and Half Tab By Mouth Once Daily As Needed 7)   Proventil Hfa 108 (90 Base) Mcg/act Aers (Albuterol Sulfate) .... 2 Puffs Thee Times Daily For 1 Week, Then Every 6 To 8 Hrs As Needed For Excessive Cough or Wheezing 8)  Symbicort 80-4.5 Mcg/act Aero (Budesonide-Formoterol Fumarate) .... 2inhalations Twice Daily 9)  Citalopram Hydrobromide 10 Mg Tabs (Citalopram Hydrobromide) .... Take 1 Tablet By Mouth Once A Day 10)  Ibuprofen 600 Mg Tabs (Ibuprofen) .... Take 1 Tablet By Mouth Two Times A Day  Allergies (verified): 1)  ! Pcn  Review of Systems      See HPI Eyes:  Denies discharge, eye pain, and red eye. GU:  Complains of dysuria and urinary frequency; 3 day h/o. Psych:  Complains of anxiety; denies mental problems, panic attacks, suicidal thoughts/plans, thoughts of violence, and unusual visions or sounds. Endo:  Denies cold intolerance, excessive hunger, excessive thirst, and excessive urination. Heme:  Denies abnormal bruising and bleeding. Allergy:  Complains of seasonal allergies; denies hives or rash and itching eyes.  Physical Exam  General:  Well-developed,well-nourished,in no acute distress; alert,appropriate and cooperative throughout examination HEENT: No facial asymmetry,  EOMI, No sinus tenderness, TM's Clear, oropharynx  pink and moist.   Chest: Clear to auscultation bilaterally.  CVS: S1, S2, No murmurs, No S3.   Abd: Soft, Nontender.  MS: Adequate ROM spine, hips, shoulders and knees.  Ext: No edema.   CNS: CN 2-12 intact, power tone and sensation normal throughout.   Skin: Intact, no visible lesions or rashes.  Psych: Good eye contact, normal affect.  Memory intact, not anxious or depressed appearing.    Impression & Recommendations:  Problem # 1:  ACUTE CYSTITIS (ICD-595.0) Assessment Comment Only  The following medications were removed from the medication list:    Septra Ds 800-160 Mg Tabs (Sulfamethoxazole-trimethoprim) .Marland Kitchen... Take 1 tablet by mouth two times a day  Encouraged to push clear liquids, get  enough rest, and take acetaminophen as needed. To be seen in 10 days if no improvement, sooner if worse.  Orders: Urinalysis ([id number]) T-Culture, Urine ([id number])  Problem # 2:  ALLERGIC RHINITIS (ICD-477.9) Assessment: Improved  Problem # 3:  COUGH (ICD-786.2) Assessment: Improved  Problem # 4:  ANXIETY, CHRONIC (ICD-300.00) Assessment: Improved  Her updated medication list for this problem includes:    Lorazepam 1 Mg Tabs (Lorazepam) ..... One and half tab by mouth once daily as needed    Citalopram Hydrobromide 10 Mg Tabs (Citalopram hydrobromide) .Marland Kitchen... Take 1 tablet by mouth once a day  Orders: Medicare Electronic Prescription [phone])  Problem # 5:  HYPOTHYROIDISM (ICD-244.9) Assessment: Comment Only  Her updated medication list for this problem includes:    Levothyroxine Sodium 25 Mcg Tabs (Levothyroxine sodium) .Marland Kitchen... Take 1 tablet by mouth once a day  Orders: T-TSH ([id number])  Labs Reviewed: TSH: 2.674 (03/19/2010)    Chol: 200 (09/11/2009)   HDL: 91 (09/11/2009)   LDL: 97 (09/11/2009)   TG: 61 (09/11/2009)  Complete Medication List: 1)  Bayer Low Strength 81 Mg Tbec (Aspirin) .... Take 1 tablet by mouth once a day 2)  Levothyroxine Sodium 25 Mcg Tabs (Levothyroxine sodium) .... Take 1 tablet by mouth once a day 3)  Calcium 500/d 500-125 Mg-unit Tabs (Calcium carbonate-vitamin d) .... Take 1 tablet by mouth three times a day 4)  Multivitamins Tabs (Multiple vitamin) .... Take 1 tablet by mouth once a day 5)  Vitamin D (ergocalciferol) 50000 Unit Caps (Ergocalciferol) .... One tab by mouth q week 6)  Lorazepam 1 Mg Tabs (Lorazepam) .... One and half tab by mouth once daily as needed 7)  Proventil Hfa 108 (90 Base) Mcg/act Aers (Albuterol sulfate) .... 2 puffs thee times daily for 1 week, then every 6 to 8 hrs as needed for excessive cough or wheezing 8)  Symbicort 80-4.5 Mcg/act Aero (Budesonide-formoterol fumarate) .... 2inhalations twice daily 9)   Citalopram Hydrobromide 10 Mg Tabs (Citalopram hydrobromide) .... Take 1 tablet by mouth once a day 10)  Ibuprofen 600 Mg Tabs (Ibuprofen) .... Take 1 tablet by mouth two times a day 11)  Fluconazole 150 Mg Tabs (Fluconazole) .... Take 1 tablet by mouth once a day  Patient Instructions: 1)  Please schedule a follow-up appointment in 2.5 to 3 months. 2)  I am so thankful that you are feeling much better Prescriptions: CITALOPRAM HYDROBROMIDE 10 MG TABS (CITALOPRAM HYDROBROMIDE) Take 1 tablet by mouth once a day  #30 x 2   Entered by:   Adella Hare LPN   Authorized by:   Syliva Overman MD   Signed by:   Adella Hare LPN on 28/41/3244   Method used:   [facility] to        [facility] [phone]* (retail)       [address]       [place], [place]  [address]  Ph: [phone] or [phone]       Fax: [phone]   RxID:   [id number] FLUCONAZOLE 150 MG TABS (FLUCONAZOLE) Take 1 tablet by mouth once a day  #1 x 0   Entered and Authorized by:   Syliva Overman MD   Signed by:   Syliva Overman MD on 04/23/2010   Method used:   Electronically to        [facility]. [phone]* (retail)       [address]       [place], [place]  [address]       Ph: [phone] or [phone]       Fax: [phone]   RxID:   [id number]    Orders Added: 1)  Est. Patient Level IV [99214] 2)  T-TSH [55732-20254] 3)  Urinalysis [81003-65000] 4)  T-Culture, Urine [[id number]] 5)  Medicare Electronic Prescription [G8553]    Laboratory Results   Urine Tests  Date/Time Received: April 23, 2010 1:54 PM  Date/Time Reported: April 23, 2010 1:54 PM   Routine Urinalysis   Color: yellow Appearance: Clear Glucose: negative   (Normal Range: Negative) Bilirubin: negative   (Normal Range: Negative) Ketone: negative   (Normal Range: Negative) Spec. Gravity: 1.025   (Normal Range: 1.003-1.035) Blood: small   (Normal Range: Negative) pH: 5.5    (Normal Range: 5.0-8.0) Protein: negative   (Normal Range: Negative) Urobilinogen: 0.2   (Normal Range: 0-1) Nitrite: negative   (Normal Range: Negative) Leukocyte Esterace: negative   (Normal Range: Negative)

## 2010-06-15 NOTE — Letter (Signed)
Summary: [place] [facility]  [address] [place] [address]   Phone: [phone]  Fax: [phone]      October 30, 2009 MRN: [id number]   SHAKEELA RABADAN [address] [place], [place]  [address]   Dear Ms. Mahlum,  Your test ordered by Selena Batten has been reviewed by your physician (or physician assistant) and was found to be normal or stable. Your physician (or physician assistant) felt no changes were needed at this time.  ____ Echocardiogram  ____ Cardiac Stress Test  ____ Lab Work  ____ Peripheral vascular study of arms, legs or neck  ____ CT scan or X-ray  ____ Lung or Breathing test  __X__ Other: 24 hour Holter Monitor Please continue on current medical treatment.   Thank you.  Chester Bing, MD, F.A.C.C

## 2010-06-15 NOTE — Letter (Signed)
Summary: [facility]  [facility]   Imported By: Lind Guest 01/26/2010 08:50:44  _____________________________________________________________________  External Attachment:    Type:   Image     Comment:   External Document

## 2010-06-15 NOTE — Assessment & Plan Note (Signed)
Summary: office visit   Vital Signs:  Patient profile:   76 year old female Menstrual status:  hysterectomy Height:      65 inches Weight:      146.75 pounds BMI:     24.51 O2 Sat:      99 % Pulse rate:   72 / minute Pulse rhythm:   regular Resp:     16 per minute BP sitting:   128 / 84  (left arm) Cuff size:   regular  Vitals Entered By: Everitt Amber LPN (February 25, 2010 11:39 AM) CC: Follow up chronic problems   Primary Care Provider:  Syliva Overman MD  CC:  Follow up chronic problems.  History of Present Illness: Reports  thatshe has not been dpoing very well in the past several weeks Denies recent fever or chills. Denies sinus pressure, nasal congestion , ear pain or sore throat. Denies chest congestion, or cough productive of sputum. Denies chest pain, palpitations, PND, orthopnea or leg swelling. Denies abdominal pain, nausea, vomitting, diarrhea or constipation. Denies change in bowel movements or bloody stool. Denies dysuria , frequency, incontinence or hesitancy.  Denies headaches, vertigo, seizures.  Denies  rash, lesions, or itch.     Allergies: 1)  ! Pcn  Review of Systems      See HPI General:  Complains of fatigue. Eyes:  Denies blurring and discharge. MS:  Complains of joint pain and stiffness; left knee pain and stiffness x 1 week, no specific trigger. Derm:  Denies itching, lesion(s), and rash. Neuro:  Denies headaches, seizures, and sensation of room spinning. Psych:  Complains of anxiety, easily angered, and easily tearful; denies mental problems, suicidal thoughts/plans, thoughts of violence, and unusual visions or sounds; stressed due to deterioratin in her spouse's health, who has increasing memory loss, also prostate ca, resistant to help,a nd in denial. Endo:  Denies cold intolerance, excessive thirst, excessive urination, and polyuria. Heme:  Denies abnormal bruising and bleeding. Allergy:  Complains of seasonal allergies; improved,  less symptomatic in recent times.  Physical Exam  General:  Well-developed,well-nourished,in no acute distress; alert,appropriate and cooperative throughout examination HEENT: No facial asymmetry,  EOMI, No sinus tenderness, TM's Clear, oropharynx  pink and moist.   Chest: Clear to auscultation bilaterally.  CVS: S1, S2, No murmurs, No S3.   Abd: Soft, Nontender.  MS: Adequate ROM spine, hips, shoulders and reduced in left  knee, which is tender over medial aspect.   Ext: No edema.   CNS: CN 2-12 intact, power tone and sensation normal throughout.   Skin: Intact, no visible lesions or rashes.  Psych: Good eye contact, normal affect.  Memory intact,  anxious  appearing, not depressed.    Impression & Recommendations:  Problem # 1:  KNEE PAIN, LEFT, ACUTE (ICD-719.46) Assessment Deteriorated prednisone dose pack and ibuprofen prescribed.  Problem # 2:  ALLERGIC RHINITIS (ICD-477.9) Assessment: Improved  Problem # 3:  ANXIETY, CHRONIC (ICD-300.00) Assessment: Deteriorated  Her updated medication list for this problem includes:    Lorazepam 1 Mg Tabs (Lorazepam) ..... One and half tab by mouth once daily as needed    Citalopram Hydrobromide 10 Mg Tabs (Citalopram hydrobromide) .Marland Kitchen... Take 1 tablet by mouth once a day  Complete Medication List: 1)  Bayer Low Strength 81 Mg Tbec (Aspirin) .... Take 1 tablet by mouth once a day 2)  Levothyroxine Sodium 25 Mcg Tabs (Levothyroxine sodium) .... Take 1 tablet by mouth once a day 3)  Calcium 500/d 500-125 Mg-unit Tabs (Calcium  carbonate-vitamin d) .... Take 1 tablet by mouth three times a day 4)  Multivitamins Tabs (Multiple vitamin) .... Take 1 tablet by mouth once a day 5)  Vitamin D (ergocalciferol) 50000 Unit Caps (Ergocalciferol) .... One tab by mouth q week 6)  Lorazepam 1 Mg Tabs (Lorazepam) .... One and half tab by mouth once daily as needed 7)  Ibuprofen 600 Mg Tabs (Ibuprofen) .... Take 1 tablet by mouth three times a day 8)   Promethazine-codeine 6.25-10 Mg/56ml Syrp (Promethazine-codeine) .... Use 1-2 tsp every 6-8 hrs as needed for cough 9)  Hydromet 5-1.5 Mg/74ml Syrp (Hydrocodone-homatropine) .Marland Kitchen.. 1 tsp every 4 hrs as needed 10)  Tessalon Perles 100 Mg Caps (Benzonatate) .... Take 1 capsule by mouth three times a day 11)  Proventil Hfa 108 (90 Base) Mcg/act Aers (Albuterol sulfate) .... 2 puffs thee times daily for 1 week, then every 6 to 8 hrs as needed for excessive cough or wheezing 12)  Symbicort 80-4.5 Mcg/act Aero (Budesonide-formoterol fumarate) .... 2inhalations twice daily 13)  Citalopram Hydrobromide 10 Mg Tabs (Citalopram hydrobromide) .... Take 1 tablet by mouth once a day 14)  Ibuprofen 600 Mg Tabs (Ibuprofen) .... Take 1 tablet by mouth two times a day 15)  Prednisone (pak) 5 Mg Tabs (Prednisone) .... Use as directed  Patient Instructions: 1)  F/U in 6 weeks 2)  You arebeing started  on med for depression and anxiety, and I strongly suggest you start therapy. 3)  Pls call when you have decide you need to go. 4)  Meds are sent in for the knee  also . Prescriptions: PREDNISONE (PAK) 5 MG TABS (PREDNISONE) Use as directed  #21 x 0   Entered and Authorized by:   Syliva Overman MD   Signed by:   Syliva Overman MD on 02/25/2010   Method used:   Electronically to        [facility]. [phone]* (retail)       [address]       [place], [place]  [address]       Ph: [phone] or [phone]       Fax: [phone]   RxID:   [id number] IBUPROFEN 600 MG TABS (IBUPROFEN) Take 1 tablet by mouth two times a day  #20 x 0   Entered and Authorized by:   Syliva Overman MD   Signed by:   Syliva Overman MD on 02/25/2010   Method used:   Electronically to        [facility]. [phone]* (retail)       [address]       [place], [place]  [address]       Ph: [phone] or [phone]       Fax: [phone]   RxID:   [id number] CITALOPRAM HYDROBROMIDE 10 MG  TABS (CITALOPRAM HYDROBROMIDE) Take 1 tablet by mouth once a day  #30 x 2   Entered and Authorized by:   Syliva Overman MD   Signed by:   Syliva Overman MD on 02/25/2010   Method used:   Electronically to        [facility]. [phone]* (retail)       [address]       [place], [place]  [address]       Ph: [phone] or [phone]  Fax: [phone]   RxID:   [id number]

## 2010-06-15 NOTE — Progress Notes (Signed)
Summary: medicine  Phone Note Call from Patient   Summary of Call: [facility] [place]  Initial call taken by: Lind Guest,  March 23, 2010 8:15 AM  Follow-up for Phone Call        sent as requested, was recently on antibiotic Follow-up by: Adella Hare LPN,  March 23, 2010 9:55 AM  Additional Follow-up for Phone Call Additional follow up Details #1::        thank you Additional Follow-up by: Syliva Overman MD,  March 23, 2010 12:22 PM    New/Updated Medications: FLUCONAZOLE 150 MG TABS (FLUCONAZOLE) one tab by mouth Prescriptions: FLUCONAZOLE 150 MG TABS (FLUCONAZOLE) one tab by mouth  #1 x 0   Entered by:   Adella Hare LPN   Authorized by:   Syliva Overman MD   Signed by:   Adella Hare LPN on 09/81/1914   Method used:   Electronically to        [facility]. [phone]* (retail)       [address]       [place], [place]  [address]       Ph: [phone] or [phone]       Fax: [phone]   RxID:   [id number]

## 2010-06-15 NOTE — Miscellaneous (Signed)
Summary: FLU  Clinical Lists Changes  Observations: Added new observation of FLU VAX: given (02/11/2010 13:46)      Preventive Care Screening  Last Flu Shot:    Date:  02/11/2010    Results:  given     given at [facility]

## 2010-06-15 NOTE — Progress Notes (Signed)
Summary: allergy asthma and sinus  allergy asthma and sinus   Imported By: Lind Guest 12/22/2009 11:27:44  _____________________________________________________________________  External Attachment:    Type:   Image     Comment:   External Document

## 2010-06-15 NOTE — Assessment & Plan Note (Signed)
Summary: OV   Vital Signs:  Patient profile:   76 year old female Menstrual status:  hysterectomy Height:      65 inches Weight:      148.75 pounds BMI:     24.84 O2 Sat:      98 % Pulse rate:   75 / minute Pulse rhythm:   regular Resp:     16 per minute BP sitting:   120 / 74  Vitals Entered By: Everitt Amber (June 02, 2009 10:54 AM) CC: this morning her throat was sore and she was alittle achy, thinks she's coming down with something    CC:  this morning her throat was sore and she was alittle achy and thinks she's coming down with something .  History of Present Illness: Pt presents with a 2 day h/o tickle in her throat, post nasal drainage, tender neck sweling and sinus pressure. Prior to this she reports that she had been doing well   Current Medications (verified): 1)  Bayer Low Strength 81 Mg  Tbec (Aspirin) .... Take 1 Tablet By Mouth Once A Day 2)  Levothyroxine Sodium 25 Mcg  Tabs (Levothyroxine Sodium) .... Take 1 Tablet By Mouth Once A Day 3)  Calcium 500/d 500-125 Mg-Unit  Tabs (Calcium Carbonate-Vitamin D) .... Take 1 Tablet By Mouth Three Times A Day 4)  Multivitamins   Tabs (Multiple Vitamin) .... Take 1 Tablet By Mouth Once A Day 5)  Darpaz 81 Mg Tabs (Meth-Hyo-M Bl-Na Phos-Ph Sal) .... One Tab By Mouth Qid Prn 6)  Ibuprofen 800 Mg Tabs (Ibuprofen) .... Take 1 Tablet By Mouth Two Times A Day 7)  Nitrofurantoin Macrocrystal 100 Mg Caps (Nitrofurantoin Macrocrystal) .... Take 1 Tablet By Mouth Once A Day 8)  Vitamin D (Ergocalciferol) 50000 Unit Caps (Ergocalciferol) .... One Tab By Mouth Q Week 9)  Lorazepam 1 Mg Tabs (Lorazepam) .... One and Half Tab By Mouth Once Daily As Needed  Allergies (verified): 1)  ! Pcn  Review of Systems General:  Denies chills and fever. Eyes:  Denies blurring and discharge. ENT:  Complains of nasal congestion, postnasal drainage, sinus pressure, and sore throat. CV:  Denies chest pain or discomfort and palpitations. Resp:   Denies chest pain with inspiration and sputum productive. GI:  Denies abdominal pain, constipation, diarrhea, nausea, and vomiting. GU:  Complains of dysuria and urinary frequency. MS:  Complains of joint pain and stiffness; chronic. Psych:  Complains of anxiety; denies depression. Allergy:  Complains of seasonal allergies; mild.  Physical Exam  General:  Well-developed,well-nourished,in no acute distress; alert,appropriate and cooperative throughout examination HEENT: No facial asymmetry,  EOMI, positive sinus tenderness, TM's Clear, oropharynx  pink and moist. left ant cervical adenitis  Chest: Clear to auscultation bilaterally.  CVS: S1, S2, No murmurs, No S3.   Abd: Soft, Nontender.  MS: Adequate ROM spine, hips, shoulders and knees.  Ext: No edema.   CNS: CN 2-12 intact, power tone and sensation normal throughout.   Skin: Intact, no visible lesions or rashes.  Psych: Good eye contact, normal affect.  Memory intact, not anxious or depressed appearing.    Impression & Recommendations:  Problem # 1:  ACUTE PHARYNGITIS (ICD-462) Assessment Comment Only  Her updated medication list for this problem includes:    Bayer Low Strength 81 Mg Tbec (Aspirin) .Marland Kitchen... Take 1 tablet by mouth once a day    Ibuprofen 800 Mg Tabs (Ibuprofen) .Marland Kitchen... Take 1 tablet by mouth two times a day    Septra  Ds 800-160 Mg Tabs (Sulfamethoxazole-trimethoprim) .Marland Kitchen... Take 1 tablet by mouth two times a day  Problem # 2:  ACUTE CYSTITIS (ICD-595.0) Assessment: Comment Only  Her updated medication list for this problem includes:    Darpaz 81 Mg Tabs (Meth-hyo-m bl-na phos-ph sal) ..... One tab by mouth qid prn    Nitrofurantoin Macrocrystal 100 Mg Caps (Nitrofurantoin macrocrystal) .Marland Kitchen... Take 1 tablet by mouth once a day    Septra Ds 800-160 Mg Tabs (Sulfamethoxazole-trimethoprim) .Marland Kitchen... Take 1 tablet by mouth two times a day  Orders: UA Dipstick W/ Micro (manual) (09811) T-Culture, Urine  ([id number])  Encouraged to push clear liquids, get enough rest, and take acetaminophen as needed. To be seen in 10 days if no improvement, sooner if worse.  Problem # 3:  ANXIETY, CHRONIC (ICD-300.00) Assessment: Improved  Her updated medication list for this problem includes:    Lorazepam 1 Mg Tabs (Lorazepam) ..... One and half tab by mouth once daily as needed  Problem # 4:  HYPOTHYROIDISM (ICD-244.9) Assessment: Comment Only  Her updated medication list for this problem includes:    Levothyroxine Sodium 25 Mcg Tabs (Levothyroxine sodium) .Marland Kitchen... Take 1 tablet by mouth once a day  Labs Reviewed: TSH: 3.140 (03/06/2009)    Chol: 200 (10/23/2008)   HDL: 93 (10/23/2008)   LDL: 96 (10/23/2008)   TG: 54 (10/23/2008)  Complete Medication List: 1)  Bayer Low Strength 81 Mg Tbec (Aspirin) .... Take 1 tablet by mouth once a day 2)  Levothyroxine Sodium 25 Mcg Tabs (Levothyroxine sodium) .... Take 1 tablet by mouth once a day 3)  Calcium 500/d 500-125 Mg-unit Tabs (Calcium carbonate-vitamin d) .... Take 1 tablet by mouth three times a day 4)  Multivitamins Tabs (Multiple vitamin) .... Take 1 tablet by mouth once a day 5)  Darpaz 81 Mg Tabs (Meth-hyo-m bl-na phos-ph sal) .... One tab by mouth qid prn 6)  Ibuprofen 800 Mg Tabs (Ibuprofen) .... Take 1 tablet by mouth two times a day 7)  Nitrofurantoin Macrocrystal 100 Mg Caps (Nitrofurantoin macrocrystal) .... Take 1 tablet by mouth once a day 8)  Vitamin D (ergocalciferol) 50000 Unit Caps (Ergocalciferol) .... One tab by mouth q week 9)  Lorazepam 1 Mg Tabs (Lorazepam) .... One and half tab by mouth once daily as needed 10)  Septra Ds 800-160 Mg Tabs (Sulfamethoxazole-trimethoprim) .... Take 1 tablet by mouth two times a day  Patient Instructions: 1)  F/u as before. 2)  You are being treated for cervical adenitis and sinusitis, meds are sent to your pharmacy. 3)  pls call if you do not imoprove in the next 4 to 5 days and pls take all  meds Prescriptions: SEPTRA DS 800-160 MG TABS (SULFAMETHOXAZOLE-TRIMETHOPRIM) Take 1 tablet by mouth two times a day  #20 x 0   Entered and Authorized by:   Syliva Overman MD   Signed by:   Syliva Overman MD on 06/02/2009   Method used:   [facility]ically to        [facility] [phone]* (retail)       [address]       [place], [place]  [address]       Ph: [phone] or [phone]       Fax: [phone]   RxID:   [id number]   Laboratory Results   Urine Tests  Date/Time Received: June 02, 2009  Date/Time Reported: June 02, 2009   Routine Urinalysis   Color: lt.  yellow Appearance: Clear Glucose: negative   (Normal Range: Negative) Bilirubin: negative   (Normal Range: Negative) Ketone: negative   (Normal Range: Negative) Spec. Gravity: 1.020   (Normal Range: 1.003-1.035) Blood: trace-intact   (Normal Range: Negative) pH: 7.0   (Normal Range: 5.0-8.0) Protein: negative   (Normal Range: Negative) Urobilinogen: 0.2   (Normal Range: 0-1) Nitrite: negative   (Normal Range: Negative) Leukocyte Esterace: trace   (Normal Range: Negative)

## 2010-06-15 NOTE — Assessment & Plan Note (Signed)
Summary: COUGH   Vital Signs:  Patient profile:   76 year old female Menstrual status:  hysterectomy Height:      65 inches Weight:      146 pounds BMI:     24.38 O2 Sat:      94 % on Room air Pulse rate:   84 / minute Pulse rhythm:   regular Resp:     16 per minute BP sitting:   140 / 80  (left arm)  Vitals Entered By: Adella Hare LPN (March 19, 2010 9:38 AM)  O2 Flow:  Room air CC: cough Is Patient Diabetic? No Pain Assessment Patient in pain? no        Primary Care Rose [name]:  Rose Overman MD  CC:  cough.  History of Present Illness: 4 day h/o worsenuing cough and chest congestion, producive of green sputum with wheezing , shortness of breath, unable to sleep las night as a result. PND which is green and some maxillary and neck tenderness.No fever or chills  Allergies (verified): 1)  ! Pcn  Review of Systems      See HPI General:  Complains of chills, fatigue, fever, loss of appetite, malaise, sleep disorder, and weakness. Eyes:  Denies discharge and red eye. ENT:  Complains of nasal congestion, postnasal drainage, sinus pressure, and sore throat. Resp:  Complains of cough, shortness of breath, sputum productive, and wheezing. GI:  Denies abdominal pain, constipation, diarrhea, nausea, and vomiting. GU:  Denies dysuria and urinary frequency. MS:  Complains of low back pain; denies joint pain and stiffness. Psych:  Complains of anxiety and depression; denies mental problems, suicidal thoughts/plans, thoughts of violence, and unusual visions or sounds; improved on meds. Endo:  Denies cold intolerance, excessive thirst, and heat intolerance. Heme:  Denies abnormal bruising and bleeding. Allergy:  Complains of seasonal allergies.  Physical Exam  General:  Well-developed,well-nourished,in no acute distress; alert,appropriate and cooperative throughout examination. Ill appearing. HEENT: No facial asymmetry,  EOMI,frontal and maxillary  sinus tenderness,  TM's Clear, oropharynx  pink and moist.   Chest: decreased air entry, bilateral crackles and wheezes CVS: S1, S2, No murmurs, No S3.   Abd: Soft, Nontender.  MS: Adequate ROM spine, hips, shoulders and knees.  Ext: No edema.   CNS: CN 2-12 intact, power tone and sensation normal throughout.   Skin: Intact, no visible lesions or rashes.  Psych: Good eye contact, normal affect.  Memory intact, not anxious or depressed appearing. \par  Impression & Recommendations:  Problem # 1:  ACUTE BRONCHITIS (ICD-466.0) Assessment Comment Only  Her updated medication list for this problem includes:    Promethazine-codeine 6.25-10 Mg/32ml Syrp (Promethazine-codeine) ..... Use 1-2 tsp every 6-8 hrs as needed for cough    Hydromet 5-1.5 Mg/23ml Syrp (Hydrocodone-homatropine) .Marland Kitchen... 1 tsp every 4 hrs as needed    Tessalon Perles 100 Mg Caps (Benzonatate) .Marland Kitchen... Take 1 capsule by mouth three times a day    Proventil Hfa 108 (90 Base) Mcg/act Aers (Albuterol sulfate) .Marland Kitchen... 2 puffs thee times daily for 1 week, then every 6 to 8 hrs as needed for excessive cough or wheezing    Symbicort 80-4.5 Mcg/act Aero (Budesonide-formoterol fumarate) .Marland Kitchen... 2inhalations twice daily    Septra Ds 800-160 Mg Tabs (Sulfamethoxazole-trimethoprim) .Marland Kitchen... Take 1 tablet by mouth two times a day  Orders: Radiology other (Radiology Other) Medicare Electronic Prescription [phone]) T-CBC w/Diff [phone]) Depo- Medrol 80mg  (J1040) Admin of Therapeutic Inj  intramuscular or subcutaneous (91478) Albuterol Sulfate Sol 1mg  unit dose (  Z6109) Ipratropium inhalation sol. unit dose (U0454) Nebulizer Tx (09811)  Problem # 2:  OTHER CHRONIC SINUSITIS (ICD-473.8) Assessment: Comment Only  Her updated medication list for this problem includes:    Promethazine-codeine 6.25-10 Mg/62ml Syrp (Promethazine-codeine) ..... Use 1-2 tsp every 6-8 hrs as needed for cough    Hydromet 5-1.5 Mg/69ml Syrp (Hydrocodone-homatropine) .Marland Kitchen... 1 tsp every 4 hrs  as needed    Tessalon Perles 100 Mg Caps (Benzonatate) .Marland Kitchen... Take 1 capsule by mouth three times a day    Septra Ds 800-160 Mg Tabs (Sulfamethoxazole-trimethoprim) .Marland Kitchen... Take 1 tablet by mouth two times a day  Problem # 3:  COUGH (ICD-786.2) Assessment: Deteriorated  Problem # 4:  DYSLIPIDEMIA (ICD-272.4) Assessment: Improved  Labs Reviewed: SGOT: 23 (06/09/2008)   SGPT: 16 (06/09/2008)   HDL:91 (09/11/2009), 90 (07/08/2009)  LDL:97 (09/11/2009), 118 (91/47/8295)  Chol:200 (09/11/2009), 227 (07/08/2009)  Trig:61 (09/11/2009), 96 (07/08/2009)  Problem # 5:  ANXIETY, CHRONIC (ICD-300.00) Assessment: Improved  Her updated medication list for this problem includes:    Lorazepam 1 Mg Tabs (Lorazepam) ..... One and half tab by mouth once daily as needed    Citalopram Hydrobromide 10 Mg Tabs (Citalopram hydrobromide) .Marland Kitchen... Take 1 tablet by mouth once a day  Orders: Medicare Electronic Prescription [phone])  Complete Medication List: 1)  Bayer Low Strength 81 Mg Tbec (Aspirin) .... Take 1 tablet by mouth once a day 2)  Levothyroxine Sodium 25 Mcg Tabs (Levothyroxine sodium) .... Take 1 tablet by mouth once a day 3)  Calcium 500/d 500-125 Mg-unit Tabs (Calcium carbonate-vitamin d) .... Take 1 tablet by mouth three times a day 4)  Multivitamins Tabs (Multiple vitamin) .... Take 1 tablet by mouth once a day 5)  Vitamin D (ergocalciferol) 50000 Unit Caps (Ergocalciferol) .... One tab by mouth q week 6)  Lorazepam 1 Mg Tabs (Lorazepam) .... One and half tab by mouth once daily as needed 7)  Ibuprofen 600 Mg Tabs (Ibuprofen) .... Take 1 tablet by mouth three times a day 8)  Promethazine-codeine 6.25-10 Mg/72ml Syrp (Promethazine-codeine) .... Use 1-2 tsp every 6-8 hrs as needed for cough 9)  Hydromet 5-1.5 Mg/71ml Syrp (Hydrocodone-homatropine) .Marland Kitchen.. 1 tsp every 4 hrs as needed 10)  Tessalon Perles 100 Mg Caps (Benzonatate) .... Take 1 capsule by mouth three times a day 11)  Proventil Hfa 108 (90  Base) Mcg/act Aers (Albuterol sulfate) .... 2 puffs thee times daily for 1 week, then every 6 to 8 hrs as needed for excessive cough or wheezing 12)  Symbicort 80-4.5 Mcg/act Aero (Budesonide-formoterol fumarate) .... 2inhalations twice daily 13)  Citalopram Hydrobromide 10 Mg Tabs (Citalopram hydrobromide) .... Take 1 tablet by mouth once a day 14)  Ibuprofen 600 Mg Tabs (Ibuprofen) .... Take 1 tablet by mouth two times a day 15)  Tessalon Perles 100 Mg Caps (Benzonatate) .... Take 1 capsule by mouth three times a day 16)  Prednisone (pak) 5 Mg Tabs (Prednisone) .... Use as directed 17)  Septra Ds 800-160 Mg Tabs (Sulfamethoxazole-trimethoprim) .... Take 1 tablet by mouth two times a day  Other Orders: CXR- 2view (CXR) T-Basic Metabolic Panel ([id number]) T-TSH [phone]) T-Vitamin D (25-Hydroxy) [phone])  Patient Instructions: 1)  F/U in 5 to 6 weeks. 2)  You are being treated for  acute bronchitis and sinusitis. 3)  You will have a breathing treatment and an injection of depromedrol 80 mg in the office. 4)  Meds are also sent in  5)  CBC and diff , stat, and chem 7 TSH  and Vit D,not statand a CXR today we will call with the results Prescriptions: SEPTRA DS 800-160 MG TABS (SULFAMETHOXAZOLE-TRIMETHOPRIM) Take 1 tablet by mouth two times a day  #20 x 0   Entered and Authorized by:   Rose Overman MD   Signed by:   Rose Overman MD on 03/19/2010   Method used:   Electronically to        [facility] [phone]* (retail)       [address]       [place], [place]  [address]       Ph: [phone] or [phone]       Fax: [phone]   RxID:   [id number] PREDNISONE (PAK) 5 MG TABS (PREDNISONE) Use as directed  #21 x 0   Entered and Authorized by:   Rose Overman MD   Signed by:   Rose Overman MD on 03/19/2010   Method used:   [facility]        [facility]  [address] [phone]* (retail)       [address]       [place], [place]  [address]       Ph: [phone] or [phone]       Fax: [phone]   RxID:   [id number] TESSALON PERLES 100 MG CAPS (BENZONATATE) Take 1 capsule by mouth three times a day  #30 x 0   Entered and Authorized by:   Rose Overman MD   Signed by:   Rose Overman MD on 03/19/2010   Method used:   Electronically to        [facility] [phone]* (retail)       [address]       [place], [place]  [address]       Ph: [phone] or [phone]       Fax: [phone]   RxID:   [id number]    Medication Administration  Injection # 1:    Medication: Depo- Medrol 80mg     Diagnosis: ACUTE BRONCHITIS (ICD-466.0)    Route: IM    Site: LUOQ gluteus    Exp Date: 07/12    Lot #: Gunnar [id number]    Mfr: Pharmacia    Patient tolerated injection without complications    Given by: Adella Hare LPN (March 19, 2010 2:04 PM)  Medication # 1:    Medication: Albuterol Sulfate Sol 1mg  unit dose    Diagnosis: ACUTE BRONCHITIS (ICD-466.0)    Dose: 2.5mg     Route: inhaled    Exp Date: 08/12    Lot #: [id number]    Mfr: nephron pharm    Patient tolerated medication without complications    Given by: Adella Hare LPN (March 19, 2010 2:04 PM)  Medication # 2:    Medication: Ipratropium inhalation sol. unit dose    Diagnosis: ACUTE BRONCHITIS (ICD-466.0)    Dose: 0.5mg     Route: inhaled    Exp Date: 10/12    Lot #: [id number]    Mfr: nephron pharm    Patient tolerated medication without complications    Given by: Adella Hare LPN (March 19, 2010 2:05 PM)  Orders Added: 1)  Est. Patient Level IV [42353] 2)  CXR- 2view [CXR] 3)  Radiology other [Radiology Other] 4)  Medicare Electronic Prescription [G8553] 5)  T-CBC w/Diff [61443-15400] 6)  T-Basic Metabolic Panel [phone]  7)  T-TSH [11914-78295] 8)  T-Vitamin D (25-Hydroxy) [phone] 9)  Depo- Medrol 80mg  [J1040] 10)  Admin of Therapeutic Inj  intramuscular or subcutaneous [96372] 11)  Albuterol  Sulfate Sol 1mg  unit dose [J7613] 12)  Ipratropium inhalation sol. unit dose [J7644] 13)  Nebulizer Tx [94640]     Medication Administration  Injection # 1:    Medication: Depo- Medrol 80mg     Diagnosis: ACUTE BRONCHITIS (ICD-466.0)    Route: IM    Site: LUOQ gluteus    Exp Date: 07/12    Lot #: Gunnar [id number]    Mfr: Pharmacia    Patient tolerated injection without complications    Given by: Adella Hare LPN (March 19, 2010 2:04 PM)  Medication # 1:    Medication: Albuterol Sulfate Sol 1mg  unit dose    Diagnosis: ACUTE BRONCHITIS (ICD-466.0)    Dose: 2.5mg     Route: inhaled    Exp Date: 08/12    Lot #: [id number]    Mfr: nephron pharm    Patient tolerated medication without complications    Given by: Adella Hare LPN (March 19, 2010 2:04 PM)  Medication # 2:    Medication: Ipratropium inhalation sol. unit dose    Diagnosis: ACUTE BRONCHITIS (ICD-466.0)    Dose: 0.5mg     Route: inhaled    Exp Date: 10/12    Lot #: [id number]    Mfr: nephron pharm    Patient tolerated medication without complications    Given by: Adella Hare LPN (March 19, 2010 2:05 PM)  Orders Added: 1)  Est. Patient Level IV [52841] 2)  CXR- 2view [CXR] 3)  Radiology other [Radiology Other] 4)  Medicare Electronic Prescription [G8553] 5)  T-CBC w/Diff [[id number]] 6)  T-Basic Metabolic Panel [[id number]] 7)  T-TSH [53664-40347] 8)  T-Vitamin D (25-Hydroxy) [42595-63875] 9)  Depo- Medrol 80mg  [J1040] 10)  Admin of Therapeutic Inj  intramuscular or subcutaneous [96372] 11)  Albuterol Sulfate Sol 1mg  unit dose [J7613] 12)  Ipratropium inhalation sol. unit dose [J7644] 13)  Nebulizer Tx [phone]

## 2010-06-15 NOTE — Progress Notes (Signed)
Summary: pt wheezing and still sick  Phone Note Call from Patient   Summary of Call: pt is still sick and took all meds. she is worse and needs to speak with nurse. pt wheezing. [phone] Initial call taken by: Rudene Anda,  October 27, 2009 4:46 PM  Follow-up for Phone Call        advised patient appt tomorrow, luann to call and schedule tomorrow morning Follow-up by: Adella Hare LPN,  October 27, 2009 5:11 PM

## 2010-06-15 NOTE — Assessment & Plan Note (Signed)
Summary: [facility] CHEST PAIN/PT SEES DR SIMPSON/TMJ   Primary Provider:  Syliva Overman MD   History of Present Illness: Patient is a 76 year old with no history of CAD.  Referred by Dr. Lodema Hong for ectopy and question murmur. The patient denies palpitations.  She said sometimes in bed she feels her heart rate slwo.  Denies dizziness.  No syncope. She has had a problem with cough, congestion.  Being treated for a URI   Now on Abx.  She notes occasional chest tightness, worse at night.  Usually when lies down.  was treated with prednisone and symptoms improved a little.  No chest pressure with exertion.  No reflux.  Current Medications (verified): 1)  Bayer Low Strength 81 Mg  Tbec (Aspirin) .... Take 1 Tablet By Mouth Once A Day 2)  Levothyroxine Sodium 25 Mcg  Tabs (Levothyroxine Sodium) .... Take 1 Tablet By Mouth Once A Day 3)  Calcium 500/d 500-125 Mg-Unit  Tabs (Calcium Carbonate-Vitamin D) .... Take 1 Tablet By Mouth Three Times A Day 4)  Multivitamins   Tabs (Multiple Vitamin) .... Take 1 Tablet By Mouth Once A Day 5)  Ibuprofen 600 Mg Tabs (Ibuprofen) .Marland Kitchen.. 1 Tab Three Times A Day 6)  Vitamin D (Ergocalciferol) 50000 Unit Caps (Ergocalciferol) .... One Tab By Mouth Q Week 7)  Lorazepam 1 Mg Tabs (Lorazepam) .... One and Half Tab By Mouth Once Daily As Needed 8)  Ibuprofen 600 Mg Tabs (Ibuprofen) .... Take 1 Tablet By Mouth Three Times A Day  Allergies (verified): 1)  ! Pcn  Past History:  Past Medical History: Last updated: 08/14/2009 DYSLIPIDEMIA (ICD-272.4) ANXIETY, CHRONIC (ICD-300.00) HYPOTHYROIDISM (ICD-244.9) Arrythmia - PAC's  Past Surgical History: Last updated: 04/27/2007 Appendectomy Cholecystectomy Hysterectomy-partial  Family History: Last updated: April 27, 2007 NO CHILDREN MOTHER DECEASED  COLON CANCER FATHER DECEASED  COLON CANCER THREE LIVING SISTERS / TWO HYPERTENSIVE THREE BROTHERS LIVING / ONE DIAGNOSED WITH COLON CANCER / ONE DECEASED  SECONDARY TO  CANCER  Social History: Last updated: 2007/04/27 Married Never Smoked Alcohol use-no Drug use-no  Review of Systems       ALl systems reviewed.  Negative to the above problem.  Note in April  LDL was 118, HLD was 90, Trig were 96.  Vital Signs:  Patient profile:   76 year old female Menstrual status:  hysterectomy Height:      65 inches Weight:      146 pounds O2 Sat:      96 % on Room air Pulse rate:   52 / minute BP sitting:   132 / 83  (left arm)  Vitals Entered ByLarita Fife Via LPN (Oct 09, 2009 1:09 PM)  O2 Flow:  Room air  Physical Exam  Additional Exam:  Pateint was in NAD HEENT:  Normocephalic, atraumatic. EOMI, PERRLA.  Neck: JVP is normal. No thyromegaly. No bruits.  Lungs: clear to auscultation. No rales no wheezes.  Heart: Regular rate and rhythm. Normal S1, S2. No S3.   Gr I/VI sys murmur goes away with sitting. PMI not displaced.  Abdomen:  Supple, nontender. Normal bowel sounds. No masses. No hepatomegaly.  Extremities:   Good distal pulses throughout. No lower extremity edema.  Musculoskeletal :moving all extremities.  Neuro:   alert and oriented x3.    EKG  Procedure date:  10/09/2009  Findings:      NSR>  PACs.  75 bpm.  Impression & Recommendations:  Problem # 1:  MURMUR (ICD-785.2) On exam today, murmur is very subtle,  disappears with sitting.  To ease patients concerne  will get echo.  Will help evaluate PACs Orders: 2-D Echocardiogram (2D Echo)  Problem # 2:  PREMATURE ATRIAL CONTRACTIONS (ICD-427.61) Holter. Her updated medication list for this problem includes:    Bayer Low Strength 81 Mg Tbec (Aspirin) .Marland Kitchen... Take 1 tablet by mouth once a day  Orders: Holter Monitor (Holter Monitor)  Problem # 3:  CHEST PAIN UNSPECIFIED (ICD-786.50) I am not convinced it is cardiac.  Rx for URI/allergies. Her updated medication list for this problem includes:    Bayer Low Strength 81 Mg Tbec (Aspirin) .Marland Kitchen... Take 1 tablet by mouth once a  day  Patient Instructions: 1)  Your physician recommends that you schedule a follow-up appointment in: as needed  2)  Your physician has requested that you have an echocardiogram.  Echocardiography is a painless test that uses sound waves to create images of your heart. It provides your doctor with information about the size and shape of your heart and how well your heart's chambers and valves are working.  This procedure takes approximately one hour. There are no restrictions for this procedure. 3)  Your physician has recommended that you wear a holter monitor.  Holter monitors are medical devices that record the heart's electrical activity. Doctors most often use these monitors to diagnose arrhythmias. Arrhythmias are problems with the speed or rhythm of the heartbeat. The monitor is a small, portable device. You can wear one while you do your normal daily activities. This is usually used to diagnose what is causing palpitations/syncope (passing out).

## 2010-06-15 NOTE — Progress Notes (Signed)
Summary: MEDICINE  Phone Note Call from Patient   Summary of Call: NEEDS SOMETHING FOR COUGH AND CONGESTED  [facility]  CALL WHEN DONE AT [phone] Initial call taken by: Lind Guest,  November 04, 2009 8:13 AM  Follow-up for Phone Call        pls advise pt I believe she really needs to see an allergist, I will refer her, I will send in an inhaler, symbicort fo her to help with cough, pls provide saving cardi weill send a referral also Follow-up by: Syliva Overman MD,  November 04, 2009 11:16 AM  Additional Follow-up for Phone Call Additional follow up Details #1::        Patient aware Additional Follow-up by: Everitt Amber LPN,  November 04, 2009 1:29 PM

## 2010-06-15 NOTE — Miscellaneous (Signed)
Summary: refill  Clinical Lists Changes  Medications: Added new medication of LORAZEPAM 1 MG TABS (LORAZEPAM) one and half tab by mouth once daily as needed - Signed Rx of LORAZEPAM 1 MG TABS (LORAZEPAM) one and half tab by mouth once daily as needed;  #60 x 2;  Signed;  Entered by: Worthy Keeler LPN;  Authorized by: Syliva Overman MD;  Method used: Printed then [facility]ed to [facility]. [phone]*, [address] [place], [place], [place]  [address], Ph: [phone] or [phone], Fax: [phone]    Prescriptions: LORAZEPAM 1 MG TABS (LORAZEPAM) one and half tab by mouth once daily as needed  #60 x 2   Entered by:   Worthy Keeler LPN   Authorized by:   Syliva Overman MD   Signed by:   Worthy Keeler LPN on 34/74/2595   Method used:   Printed then faxed to ...       [facility]  [address] [phone]* (retail)       [address]       [place], [place]  [address]       Ph: [phone] or [phone]       Fax: [phone]   RxID:   [id number]

## 2010-06-15 NOTE — Miscellaneous (Signed)
  Clinical Lists Changes  Medications: Added new medication of SYMBICORT 80-4.5 MCG/ACT AERO (BUDESONIDE-FORMOTEROL FUMARATE) 2inhalations twice daily - Signed Rx of SYMBICORT 80-4.5 MCG/ACT AERO (BUDESONIDE-FORMOTEROL FUMARATE) 2inhalations twice daily;  #1 x 3;  Signed;  Entered by: Syliva Overman MD;  Authorized by: Syliva Overman MD;  Method used: Electronically to [facility]  [facility] [phone]*, [address] [place], [place], [place]  [address], Ph: [phone] or [phone], Fax: [phone]    Prescriptions: SYMBICORT 80-4.5 MCG/ACT AERO (BUDESONIDE-FORMOTEROL FUMARATE) 2inhalations twice daily  #1 x 3   Entered and Authorized by:   Syliva Overman MD   Signed by:   Syliva Overman MD on 11/04/2009   Method used:   Electronically to        [facility]. [phone]* (retail)       [address]       [place], [place]  [address]       Ph: [phone] or [phone]       Fax: [phone]   RxID:   [id number]   Appended Document:  pls refer pt to [facility] allergy eval chronic cough, pls fax her recent cxr  Appended Document:  pt has appt with dr. Corinda Gubler for 11/19/2009 8:30. pt aware

## 2010-06-15 NOTE — Progress Notes (Signed)
[facility]   Imported By: Lind Guest 10/30/2009 07:49:54  _____________________________________________________________________  External Attachment:    Type:   Image     Comment:   External Document

## 2010-06-15 NOTE — Assessment & Plan Note (Signed)
Summary: UTI   Vital Signs:  Patient profile:   76 year old female Menstrual status:  hysterectomy Height:      65 inches Weight:      147.75 pounds O2 Sat:      95 % on Room air Pulse rate:   74 / minute BP sitting:   128 / 80  (left arm) CC: patient has blood in her urine, she saw it this morning.  pain in the lower abdomin  Is Patient Diabetic? No   Primary Provider:  Syliva Overman MD  CC:  patient has blood in her urine and she saw it this morning.  pain in the lower abdomin .  History of Present Illness: Pt reports onset of frequency and urgency with urination last night.  Noticed bood in her urine this am.  No fever or chills.  Little discomfort in lower abd. No nausea or vomiting   Allergies: 1)  ! Pcn  Past History:  Past medical history reviewed for relevance to current acute and chronic problems.  Past Medical History: Reviewed history from 08/14/2009 and no changes required. DYSLIPIDEMIA (ICD-272.4) ANXIETY, CHRONIC (ICD-300.00) HYPOTHYROIDISM (ICD-244.9) Arrythmia - PAC's  Review of Systems General:  Denies chills and fever. GI:  Complains of abdominal pain; denies nausea and vomiting. GU:  Complains of dysuria, hematuria, nocturia, and urinary frequency.  Physical Exam  General:  Well-developed,well-nourished,in no acute distress; alert,appropriate and cooperative throughout examination Head:  Normocephalic and atraumatic without obvious abnormalities. No apparent alopecia or balding. Lungs:  Normal respiratory effort, chest expands symmetrically. Lungs are clear to auscultation, no crackles or wheezes. Heart:  Normal rate and regular rhythm. S1 and S2 normal without gallop, murmur, click, rub or other extra sounds. Abdomen:  soft and no masses.  mild suprapubic TTP   Impression & Recommendations:  Problem # 1:  ACUTE CYSTITIS (ICD-595.0) Assessment New  Her updated medication list for this problem includes:    Ciprofloxacin Hcl 250 Mg Tabs  (Ciprofloxacin hcl) .Marland Kitchen... Take one capsule two times a day x 5 days  Complete Medication List: 1)  Bayer Low Strength 81 Mg Tbec (Aspirin) .... Take 1 tablet by mouth once a day 2)  Levothyroxine Sodium 25 Mcg Tabs (Levothyroxine sodium) .... Take 1 tablet by mouth once a day 3)  Calcium 500/d 500-125 Mg-unit Tabs (Calcium carbonate-vitamin d) .... Take 1 tablet by mouth three times a day 4)  Multivitamins Tabs (Multiple vitamin) .... Take 1 tablet by mouth once a day 5)  Vitamin D (ergocalciferol) 50000 Unit Caps (Ergocalciferol) .... One tab by mouth q week 6)  Lorazepam 1 Mg Tabs (Lorazepam) .... One and half tab by mouth once daily as needed 7)  Ibuprofen 600 Mg Tabs (Ibuprofen) .... Take 1 tablet by mouth three times a day 8)  Promethazine-codeine 6.25-10 Mg/55ml Syrp (Promethazine-codeine) .... Use 1-2 tsp every 6-8 hrs as needed for cough 9)  Hydromet 5-1.5 Mg/51ml Syrp (Hydrocodone-homatropine) .Marland Kitchen.. 1 tsp every 4 hrs as needed 10)  Tessalon Perles 100 Mg Caps (Benzonatate) .... Take 1 capsule by mouth three times a day 11)  Proventil Hfa 108 (90 Base) Mcg/act Aers (Albuterol sulfate) .... 2 puffs thee times daily for 1 week, then every 6 to 8 hrs as needed for excessive cough or wheezing 12)  Symbicort 80-4.5 Mcg/act Aero (Budesonide-formoterol fumarate) .... 2inhalations twice daily 13)  Citalopram Hydrobromide 10 Mg Tabs (Citalopram hydrobromide) .... Take 1 tablet by mouth once a day 14)  Ibuprofen 600 Mg Tabs (Ibuprofen) .Marland KitchenMarland KitchenMarland Kitchen  Take 1 tablet by mouth two times a day 15)  Ciprofloxacin Hcl 250 Mg Tabs (Ciprofloxacin hcl) .... Take one capsule two times a day x 5 days  Patient Instructions: 1)  Follow up as planned.  Recheck sooner if needed. 2)  I have prescribed an antibiotic for your urinary infection. 3)  Increase fluids. Prescriptions: CIPROFLOXACIN HCL 250 MG TABS (CIPROFLOXACIN HCL) take one capsule two times a day x 5 days  #10 x 0   Entered and Authorized by:   Esperanza Sheets  PA   Signed by:   Esperanza Sheets PA on 03/05/2010   Method used:   Electronically to        [facility]. [phone]* (retail)       [address]       [place], [place]  [address]       Ph: [phone] or [phone]       Fax: [phone]   RxID:   [id number]    Orders Added: 1)  New Patient Level II [99202]    Laboratory Results   Urine Tests  Date/Time Received: 03/05/10 10:08  Routine Urinalysis   Color: red Appearance: Cloudy Glucose: negative   (Normal Range: Negative) Bilirubin: negative   (Normal Range: Negative) Ketone: negative   (Normal Range: Negative) Spec. Gravity: 1.015   (Normal Range: 1.003-1.035) Blood: large   (Normal Range: Negative) pH: 6.5   (Normal Range: 5.0-8.0) Protein: 100   (Normal Range: Negative) Urobilinogen: 0.2   (Normal Range: 0-1) Nitrite: negative   (Normal Range: Negative) Leukocyte Esterace: large   (Normal Range: Negative)        Appended Document: UTI

## 2010-06-15 NOTE — Assessment & Plan Note (Signed)
Summary: sick- room 2   Vital Signs:  Patient profile:   76 year old female Menstrual status:  hysterectomy Height:      65 inches Weight:      146 pounds BMI:     24.38 O2 Sat:      98 % on Room air Pulse rate:   68 / minute Resp:     16 per minute BP sitting:   140 / 90  (left arm)  Vitals Entered By: Adella Hare LPN (October 15, 4399 10:30 AM) CC: cough and congestion Is Patient Diabetic? No Pain Assessment Patient in pain? no        Primary Provider:  Syliva Overman MD  CC:  cough and congestion.  History of Present Illness: Pt is here today with c/o cough.  She was seen 09-30-09 with same.  She states the cough is worsening.  Is becoming more productive.  Is still worse at night when she lies down, but has increased during the daytime hrs.  She is still having nasal congestion & post nasal drainage.  No fever or chills.  She has tried the [employer] previously prescribed but did not help.  Allergies (verified): 1)  ! Pcn  Past History:  Past medical history reviewed for relevance to current acute and chronic problems.  Past Medical History: Reviewed history from 08/14/2009 and no changes required. DYSLIPIDEMIA (ICD-272.4) ANXIETY, CHRONIC (ICD-300.00) HYPOTHYROIDISM (ICD-244.9) Arrythmia - PAC's  Review of Systems General:  Denies chills and fever. ENT:  Complains of nasal congestion, postnasal drainage, and sinus pressure; denies earache and sore throat. CV:  Complains of chest pain or discomfort. Resp:  Complains of cough and sputum productive; denies shortness of breath. Allergy:  Complains of seasonal allergies; denies sneezing.  Physical Exam  General:  Well-developed,well-nourished,in no acute distress; alert,appropriate and cooperative throughout examination Ears:  External ear exam shows no significant lesions or deformities.  Otoscopic examination reveals clear canals, tympanic membranes are intact bilaterally without bulging, retraction,  inflammation or discharge. Hearing is grossly normal bilaterally. Nose:  Nasal turbs severely swollen bilat.  No mucus.no external deformity, L maxillary sinus tenderness, and R maxillary sinus tenderness.   Mouth:  Oral mucosa and oropharynx without lesions or exudates.   Neck:  No deformities, masses, or tenderness noted. Lungs:  Normal respiratory effort, chest expands symmetrically. Lungs are clear to auscultation, no crackles or wheezes. Heart:  Normal rate and regular rhythm. S1 and S2 normal without gallop, murmur, click, rub or other extra sounds. Cervical Nodes:  No lymphadenopathy noted Psych:  Cognition and judgment appear intact. Alert and cooperative with normal attention span and concentration. No apparent delusions, illusions, hallucinations   Impression & Recommendations:  Problem # 1:  SINUSITIS, ACUTE (ICD-461.9) Assessment New  Her updated medication list for this problem includes:    Promethazine-codeine 6.25-10 Mg/61ml Syrp (Promethazine-codeine) ..... Use 1-2 tsp every 6-8 hrs as needed for cough    Septra Ds 800-160 Mg Tabs (Sulfamethoxazole-trimethoprim) .Marland Kitchen... Take 1 two times a day x 10 days  Problem # 2:  ACUTE BRONCHITIS (ICD-466.0) Assessment: Deteriorated  Her updated medication list for this problem includes:    Promethazine-codeine 6.25-10 Mg/25ml Syrp (Promethazine-codeine) ..... Use 1-2 tsp every 6-8 hrs as needed for cough    Septra Ds 800-160 Mg Tabs (Sulfamethoxazole-trimethoprim) .Marland Kitchen... Take 1 two times a day x 10 days  Complete Medication List: 1)  Bayer Low Strength 81 Mg Tbec (Aspirin) .... Take 1 tablet by mouth once a day 2)  Levothyroxine Sodium 25 Mcg Tabs (Levothyroxine sodium) .... Take 1 tablet by mouth once a day 3)  Calcium 500/d 500-125 Mg-unit Tabs (Calcium carbonate-vitamin d) .... Take 1 tablet by mouth three times a day 4)  Multivitamins Tabs (Multiple vitamin) .... Take 1 tablet by mouth once a day 5)  Ibuprofen 600 Mg Tabs  (Ibuprofen) .Marland Kitchen.. 1 tab three times a day 6)  Vitamin D (ergocalciferol) 50000 Unit Caps (Ergocalciferol) .... One tab by mouth q week 7)  Lorazepam 1 Mg Tabs (Lorazepam) .... One and half tab by mouth once daily as needed 8)  Ibuprofen 600 Mg Tabs (Ibuprofen) .... Take 1 tablet by mouth three times a day 9)  Promethazine-codeine 6.25-10 Mg/61ml Syrp (Promethazine-codeine) .... Use 1-2 tsp every 6-8 hrs as needed for cough 10)  Septra Ds 800-160 Mg Tabs (Sulfamethoxazole-trimethoprim) .... Take 1 two times a day x 10 days  Patient Instructions: 1)  Please schedule a follow-up appointment as needed. 2)  Get plenty of rest, drink lots of clear liquids, and use Tylenol or Ibuprofen for fever and comfort. Return in 7-10 days if you're not better:sooner if you're feeling worse. 3)  I have prescribed an antibiotic and a cough syrup for you. 4)  Increase fluids and rest. Prescriptions: SEPTRA DS 800-160 MG TABS (SULFAMETHOXAZOLE-TRIMETHOPRIM) take 1 two times a day x 10 days  #20 x 0   Entered and Authorized by:   Esperanza Sheets PA   Signed by:   Esperanza Sheets PA on 10/14/2009   Method used:   Printed then faxed [facility] ...       [facility]  [address] [phone]* (retail)       [address]       [place], [place]  [address]       Ph: [phone] or [phone]       Fax: [phone]   RxID:   [id number] PROMETHAZINE-CODEINE 6.25-10 MG/5ML SYRP (PROMETHAZINE-CODEINE) use 1-2 tsp every 6-8 hrs as needed for cough  #6 oz x 0   Entered and Authorized by:   Esperanza Sheets PA   Signed by:   Esperanza Sheets PA on 10/14/2009   Method used:   Printed then faxed to ...       [facility]  [address] [phone]* (retail)       [address]       [place], [place]  [address]       Ph: [phone] or [phone]       Fax: [phone]   RxID:   [id number]

## 2010-06-15 NOTE — Assessment & Plan Note (Signed)
Summary: sick - room 1   Vital Signs:  Patient profile:   76 year old female Menstrual status:  hysterectomy Height:      65 inches Weight:      147.25 pounds BMI:     24.59 O2 Sat:      96 % on Room air Pulse rate:   81 / minute Resp:     16 per minute BP sitting:   164 / 80  (left arm)  Vitals Entered By: Adella Hare LPN (Sep 30, 2009 9:14 AM) CC: cough and congestion Is Patient Diabetic? No Pain Assessment Patient in pain? no      Comments did not bring meds to ov   CC:  cough and congestion.  History of Present Illness: Pt states she developed a coughover the last 2-3 days. It is worse at night.  Last night she had to sit up to sleep to help the cough.  She is also having nasal congestion and post nasal drainage.  States she has a hx of allergies this time of yr and she thinks it is due to her allergies.  She is getting a little pressure in her ears but no ear pain.  No fever or chills.  She did try an over the counter cough med last night but no help.  Also used a tsp of Honey.  She is not using any decongestants or allergy meds.  Hx of PACs. Has an appt at [facility] cardiology next week.    Allergies (verified): 1)  ! Pcn  Past History:  Past medical history reviewed for relevance to current acute and chronic problems.  Past Medical History: Reviewed history from 08/14/2009 and no changes required. DYSLIPIDEMIA (ICD-272.4) ANXIETY, CHRONIC (ICD-300.00) HYPOTHYROIDISM (ICD-244.9) Arrythmia - PAC's  Review of Systems General:  Denies chills and fever. ENT:  Complains of nasal congestion and postnasal drainage; denies earache, sinus pressure, and sore throat. CV:  Complains of palpitations; denies chest pain or discomfort; HX OF PACs. Resp:  Complains of cough; denies shortness of breath, sputum productive, and wheezing. Allergy:  Complains of seasonal allergies and sneezing; denies itching eyes.  Physical Exam  General:  Well-developed,well-nourished,in no  acute distress; alert,appropriate and cooperative throughout examination Head:  Normocephalic and atraumatic without obvious abnormalities. No apparent alopecia or balding. Ears:  External ear exam shows no significant lesions or deformities.  Otoscopic examination reveals clear canals, tympanic membranes are intact bilaterally without bulging, retraction, inflammation or discharge. Hearing is grossly normal bilaterally. Nose:  Nasal turbs mod swollen and bluish.  Sm amt of clear mucus.no external deformity and no sinus percussion tenderness.   Mouth:  Oral mucosa and oropharynx without lesions or exudates.  Neck:  No deformities, masses, or tenderness noted. Lungs:  Normal respiratory effort, chest expands symmetrically. Lungs are clear to auscultation, no crackles or wheezes. Heart:  normal rate, no murmur, and irregular rhythm.   Cervical Nodes:  No lymphadenopathy noted Psych:  Cognition and judgment appear intact. Alert and cooperative with normal attention span and concentration. No apparent delusions, illusions, hallucinations   Impression & Recommendations:  Problem # 1:  ALLERGIC RHINITIS (ICD-477.9) Assessment Deteriorated  Her updated medication list for this problem includes:    Flonase 50 Mcg/act Susp (Fluticasone propionate) ..... Use 2 sprays each nostril once daily for allergies  Problem # 2:  COUGH (ICD-786.2) Assessment: New Due to post nasal drainage.   Rxd [employer].  Advised pt to discontinue over the counter cough med.  Problem # 3:  PREMATURE ATRIAL CONTRACTIONS (ICD-427.61) Assessment: Unchanged Keep cardiology appt next week.  Her updated medication list for this problem includes:    Bayer Low Strength 81 Mg Tbec (Aspirin) .Marland Kitchen... Take 1 tablet by mouth once a day  Complete Medication List: 1)  Bayer Low Strength 81 Mg Tbec (Aspirin) .... Take 1 tablet by mouth once a day 2)  Levothyroxine Sodium 25 Mcg Tabs (Levothyroxine sodium) .... Take 1 tablet by  mouth once a day 3)  Calcium 500/d 500-125 Mg-unit Tabs (Calcium carbonate-vitamin d) .... Take 1 tablet by mouth three times a day 4)  Multivitamins Tabs (Multiple vitamin) .... Take 1 tablet by mouth once a day 5)  Darpaz 81 Mg Tabs (Meth-hyo-m bl-na phos-ph sal) .... One tab by mouth qid prn 6)  Ibuprofen 800 Mg Tabs (Ibuprofen) .... Take 1 tablet by mouth two times a day 7)  Vitamin D (ergocalciferol) 50000 Unit Caps (Ergocalciferol) .... One tab by mouth q week 8)  Lorazepam 1 Mg Tabs (Lorazepam) .... One and half tab by mouth once daily as needed 9)  Ibuprofen 600 Mg Tabs (Ibuprofen) .... Take 1 tablet by mouth three times a day 10)  Prednisone (pak) 5 Mg Tabs (Prednisone) .... Take once daily as directed 11)  Flonase 50 Mcg/act Susp (Fluticasone propionate) .... Use 2 sprays each nostril once daily for allergies 12)  Tessalon Perles 100 Mg Caps (Benzonatate) .... Take 1 -2 every 8 hrs as needed for cough  Patient Instructions: 1)  Get plenty of rest, drink lots of clear liquids, and use Tylenol or Ibuprofen for fever and comfort. Return in 7-10 days if you're not better:sooner if you're feeling worse. 2)  Please schedule a follow-up appointment as needed. 3)  I have prescribed an allergy nasal spray, a cough medicine and some prednisone to help with your syptoms. 4)  If your cough becomes productive, or you begin to get discolored mucus you may need an antibiotic. Please call the office. Prescriptions: TESSALON PERLES 100 MG CAPS (BENZONATATE) take 1 -2 every 8 hrs as needed for cough  #40 x 0   Entered and Authorized by:   Esperanza Sheets PA   Signed by:   Esperanza Sheets PA on 09/30/2009   Method used:   Electronically to        [facility]. [phone]* (retail)       [address]       [place], [place]  [address]       Ph: [phone] or [phone]       Fax: [phone]   RxID:   [id number] FLONASE 50 MCG/ACT SUSP (FLUTICASONE PROPIONATE) use 2 sprays each  nostril once daily for allergies  #1 x 2   Entered and Authorized by:   Esperanza Sheets PA   Signed by:   Esperanza Sheets PA on 09/30/2009   Method used:   Electronically to        [facility]. [phone]* (retail)       [address]       [place], [place]  [address]       Ph: [phone] or [phone]       Fax: [phone]   RxID:   [id number] PREDNISONE (PAK) 5 MG TABS (PREDNISONE) take once daily as directed  #1 pak x 0   Entered and Authorized by:   Esperanza Sheets PA   Signed by:  Esperanza Sheets PA on 09/30/2009   Method used:   Electronically to        [facility]. [phone]* (retail)       [address]       [place], [place]  [address]       Ph: [phone] or [phone]       Fax: [phone]   RxID:   [id number]

## 2010-06-15 NOTE — Assessment & Plan Note (Signed)
Summary: sick- room 2   Vital Signs:  Patient profile:   76 year old female Menstrual status:  hysterectomy Height:      65 inches Weight:      148.50 pounds BMI:     24.80 O2 Sat:      98 % on Room air Pulse rate:   53 / minute Resp:     16 per minute BP sitting:   170 / 100  (left arm)  Vitals Entered By: Adella Hare LPN (August 14, 1608 10:06 AM) CC: head congestion, sore throat Is Patient Diabetic? No Pain Assessment Patient in pain? no        CC:  head congestion and sore throat.  History of Present Illness: Pt presents today with c/o nasal congestion & post nasal drainage since yesterday.  Did have some cough during the night last night.  But none today.  Throat is feeling a little irritated.  Is using thrt lozenges for this.  No fever or chills.  Is under alot of stress right now.  Her husb had surgery for prostate CA 1 wk ago.  Hx of anxiety. Has a refill available on her Lorazepam.  Current Medications (verified): 1)  Bayer Low Strength 81 Mg  Tbec (Aspirin) .... Take 1 Tablet By Mouth Once A Day 2)  Levothyroxine Sodium 25 Mcg  Tabs (Levothyroxine Sodium) .... Take 1 Tablet By Mouth Once A Day 3)  Calcium 500/d 500-125 Mg-Unit  Tabs (Calcium Carbonate-Vitamin D) .... Take 1 Tablet By Mouth Three Times A Day 4)  Multivitamins   Tabs (Multiple Vitamin) .... Take 1 Tablet By Mouth Once A Day 5)  Darpaz 81 Mg Tabs (Meth-Hyo-M Bl-Na Phos-Ph Sal) .... One Tab By Mouth Qid Prn 6)  Ibuprofen 800 Mg Tabs (Ibuprofen) .... Take 1 Tablet By Mouth Two Times A Day 7)  Vitamin D (Ergocalciferol) 50000 Unit Caps (Ergocalciferol) .... One Tab By Mouth Q Week 8)  Lorazepam 1 Mg Tabs (Lorazepam) .... One and Half Tab By Mouth Once Daily As Needed  Allergies (verified): 1)  ! Pcn  Past History:  Past medical history reviewed for relevance to current acute and chronic problems.  Past Medical History: DYSLIPIDEMIA (ICD-272.4) ANXIETY, CHRONIC (ICD-300.00) HYPOTHYROIDISM  (ICD-244.9) Arrythmia - PAC's  Review of Systems General:  Denies chills and fever. ENT:  Complains of earache, nasal congestion, postnasal drainage, and sore throat; denies sinus pressure. CV:  Denies chest pain or discomfort and palpitations. Resp:  Complains of cough; denies shortness of breath and sputum productive.  Physical Exam  General:  Well-developed,well-nourished,in no acute distress; alert,appropriate and cooperative throughout examination Head:  Normocephalic and atraumatic without obvious abnormalities. No apparent alopecia or balding. Ears:  External ear exam shows no significant lesions or deformities.  Otoscopic examination reveals clear canals, tympanic membranes are intact bilaterally without bulging, retraction, inflammation or discharge. Hearing is grossly normal bilaterally. Nose:  no external deformity, no sinus percussion tenderness, mucosal erythema, and mucosal edema.   Mouth:  Oral mucosa and oropharynx without lesions or exudates.  teeth missing.   Neck:  No deformities, masses, or tenderness noted. Lungs:  Normal respiratory effort, chest expands symmetrically. Lungs are clear to auscultation, no crackles or wheezes. Heart:  no murmur, no gallop, and irregular rhythm.   Cervical Nodes:  No lymphadenopathy noted Psych:  memory intact for recent and remote, normally interactive, good eye contact, and tearful.     Impression & Recommendations:  Problem # 1:  SINUSITIS, ACUTE (ICD-461.9) Assessment  New  Her updated medication list for this problem includes:    Zithromax Z-pak 250 Mg Tabs (Azithromycin) .Marland Kitchen... Take daily as directed  Orders: Depo- Medrol 80mg  (J1040) Admin of Therapeutic Inj  intramuscular or subcutaneous (14782)  Problem # 2:  PREMATURE ATRIAL CONTRACTIONS (ICD-427.61) Assessment: Unchanged  Her updated medication list for this problem includes:    Bayer Low Strength 81 Mg Tbec (Aspirin) .Marland Kitchen... Take 1 tablet by mouth once a  day  Orders: EKG w/ Interpretation (93000)  Problem # 3:  ANXIETY, CHRONIC (ICD-300.00) Assessment: Deteriorated  Her updated medication list for this problem includes:    Lorazepam 1 Mg Tabs (Lorazepam) ..... One and half tab by mouth once daily as needed  Complete Medication List: 1)  Bayer Low Strength 81 Mg Tbec (Aspirin) .... Take 1 tablet by mouth once a day 2)  Levothyroxine Sodium 25 Mcg Tabs (Levothyroxine sodium) .... Take 1 tablet by mouth once a day 3)  Calcium 500/d 500-125 Mg-unit Tabs (Calcium carbonate-vitamin d) .... Take 1 tablet by mouth three times a day 4)  Multivitamins Tabs (Multiple vitamin) .... Take 1 tablet by mouth once a day 5)  Darpaz 81 Mg Tabs (Meth-hyo-m bl-na phos-ph sal) .... One tab by mouth qid prn 6)  Ibuprofen 800 Mg Tabs (Ibuprofen) .... Take 1 tablet by mouth two times a day 7)  Vitamin D (ergocalciferol) 50000 Unit Caps (Ergocalciferol) .... One tab by mouth q week 8)  Lorazepam 1 Mg Tabs (Lorazepam) .... One and half tab by mouth once daily as needed 9)  Zithromax Z-pak 250 Mg Tabs (Azithromycin) .... Take daily as directed  Patient Instructions: 1)  Please schedule a follow-up appointment as needed. 2)  I have prescribed an antibiotic for you. 3)  You have received a shot of DepoMedrol today. 4)  Please avoid any over the counter cold medications due to your irregular heart beats.  You may continue the throat lozenges.  You may also use cough medication as long as it does not have anything for congestion in it.  Delsym, Mucinex DM or Robitussin DM would be good choices for you. 5)  Use your Lorazepam as needed for anxiety. Prescriptions: ZITHROMAX Z-PAK 250 MG TABS (AZITHROMYCIN) take daily as directed  #1 x 0   Entered and Authorized by:   Esperanza Sheets PA   Signed by:   Esperanza Sheets PA on 08/14/2009   Method used:   [facility] to        [facility]. [phone]* (retail)       [address]       [place], [place]   [address]       Ph: [phone] or [phone]       Fax: [phone]   RxID:   [id number]    Medication Administration  Injection # 1:    Medication: Depo- Medrol 80mg     Diagnosis: SINUSITIS, ACUTE (ICD-461.9)    Route: IM    Site: RUOQ gluteus    Exp Date: 11/11    Lot #: [id number]    Mfr: Pharmacia    Patient tolerated injection without complications    Given by: Adella Hare LPN (August 15, 1882 10:57 AM)  Orders Added: 1)  Depo- Medrol 80mg  [J1040] 2)  Admin of Therapeutic Inj  intramuscular or subcutaneous [96372] 3)  Est. Patient Level IV [16606] 4)  EKG w/ Interpretation [93000]

## 2010-06-15 NOTE — Assessment & Plan Note (Signed)
Summary: sick   Vital Signs:  Patient profile:   76 year old female Menstrual status:  hysterectomy Height:      65 inches Weight:      145.25 pounds BMI:     24.26 O2 Sat:      94 % Temp:     99.0 degrees F oral Pulse rate:   103 / minute Resp:     16 per minute BP sitting:   134 / 82  (left arm) Cuff size:   regular  Vitals Entered By: Everitt Amber LPN (October 28, 2009 1:32 PM) CC: HAS BEEN SICK FOR 2 WEEKS, WENT TO ER IN [place], HAS A COUGH, PRODUCING SMALL AMOUNTS OF PHLEGM, WHEEZING   Primary Care Provider:  Syliva Overman MD  CC:  HAS BEEN SICK FOR 2 WEEKS, WENT TO ER IN [place], HAS A COUGH, PRODUCING SMALL AMOUNTS OF PHLEGM, and WHEEZING.  History of Present Illness: Pt has been treasted for head and chest congestion since Sep 13, 2009. She has  had a 17 day course  of antibiotics, in the office septra, from [facility] [facility] doxycycline  for 7 days .  She states at times she coughs  up yellow sputum andshe still has yellow drainage from her nose as well.She is fatihgued due to popor sleep.  Current Medications (verified): 1)  Bayer Low Strength 81 Mg  Tbec (Aspirin) .... Take 1 Tablet By Mouth Once A Day 2)  Levothyroxine Sodium 25 Mcg  Tabs (Levothyroxine Sodium) .... Take 1 Tablet By Mouth Once A Day 3)  Calcium 500/d 500-125 Mg-Unit  Tabs (Calcium Carbonate-Vitamin D) .... Take 1 Tablet By Mouth Three Times A Day 4)  Multivitamins   Tabs (Multiple Vitamin) .... Take 1 Tablet By Mouth Once A Day 5)  Vitamin D (Ergocalciferol) 50000 Unit Caps (Ergocalciferol) .... One Tab By Mouth Q Week 6)  Lorazepam 1 Mg Tabs (Lorazepam) .... One and Half Tab By Mouth Once Daily As Needed 7)  Ibuprofen 600 Mg Tabs (Ibuprofen) .... Take 1 Tablet By Mouth Three Times A Day 8)  Promethazine-Codeine 6.25-10 Mg/72ml Syrp (Promethazine-Codeine) .... Use 1-2 Tsp Every 6-8 Hrs As Needed For Cough 9)  Hydromet 5-1.5 Mg/22ml Syrp (Hydrocodone-Homatropine) .Marland Kitchen.. 1 Tsp Every 4 Hrs As Needed  Allergies  (verified): 1)  ! Pcn  Review of Systems      See HPI General:  Complains of chills, fatigue, fever, loss of appetite, malaise, and sleep disorder. Eyes:  Denies blurring and discharge. ENT:  Complains of hoarseness, nasal congestion, and postnasal drainage. CV:  Complains of shortness of breath with exertion; denies chest pain or discomfort, difficulty breathing while lying down, palpitations, and swelling of feet. Resp:  Complains of cough, shortness of breath, sputum productive, and wheezing. GI:  Denies abdominal pain, constipation, diarrhea, nausea, and vomiting. GU:  Complains of incontinence; denies dysuria and urinary frequency. MS:  Denies joint pain and stiffness. Derm:  Denies itching, lesion(s), and rash. Neuro:  Denies headaches, seizures, and sensation of room spinning. Psych:  Complains of anxiety; denies depression. Endo:  Denies excessive hunger, excessive thirst, and excessive urination. Heme:  Denies abnormal bruising and bleeding. Allergy:  Complains of seasonal allergies; uncontrolled.  Physical Exam  General:  Well-developed,well-nourished,in no acute distress; alert,appropriate and cooperative throughout examination. ill appearing, excessive cough and bronchospasm HEENT: No facial asymmetry, erythema and edema of nasal mucosa EOMI, No sinus tenderness, TM's Clear, oropharynx  pink and moist. no cervical adenopathy  Chest: decreased air entry bilaterally scattered  wheezes CVS: S1, S2, No murmurs, No S3.   Abd: Soft, Nontender.  MS: Adequate ROM spine, hips, shoulders and knees.  Ext: No edema.   CNS: CN 2-12 intact, power tone and sensation normal throughout.   Skin: Intact, no visible lesions or rashes.  Psych: Good eye contact, normal affect.  Memory intact, not anxious or depressed appearing.    Impression & Recommendations:  Problem # 1:  OTHER CHRONIC SINUSITIS (ICD-473.8) Assessment Deteriorated  Her updated medication list for this problem  includes:    Promethazine-codeine 6.25-10 Mg/36ml Syrp (Promethazine-codeine) ..... Use 1-2 tsp every 6-8 hrs as needed for cough    Hydromet 5-1.5 Mg/32ml Syrp (Hydrocodone-homatropine) .Marland Kitchen... 1 tsp every 4 hrs as needed    Tessalon Perles 100 Mg Caps (Benzonatate) .Marland Kitchen... Take 1 capsule by mouth three times a day  Orders: Radiology Referral (Radiology)  Problem # 2:  ACUTE BRONCHITIS (ICD-466.0) Assessment: Deteriorated  Her updated medication list for this problem includes:    Promethazine-codeine 6.25-10 Mg/46ml Syrp (Promethazine-codeine) ..... Use 1-2 tsp every 6-8 hrs as needed for cough    Hydromet 5-1.5 Mg/67ml Syrp (Hydrocodone-homatropine) .Marland Kitchen... 1 tsp every 4 hrs as needed    Tessalon Perles 100 Mg Caps (Benzonatate) .Marland Kitchen... Take 1 capsule by mouth three times a day    Proventil Hfa 108 (90 Base) Mcg/act Aers (Albuterol sulfate) .Marland Kitchen... 2 puffs thee times daily for 1 week, then every 6 to 8 hrs as needed for excessive cough or wheezing  Orders: Depo- Medrol 80mg  (J1040) Albuterol Sulfate Sol 1mg  unit dose (Z6109) Atrovent 1mg  (Neb) (U0454) Nebulizer Tx (09811) Admin of Therapeutic Inj  intramuscular or subcutaneous (91478)  Problem # 3:  COUGH (ICD-786.2) Assessment: Deteriorated  Orders: CXR- 2view (CXR)  Problem # 4:  ALLERGIC RHINITIS (ICD-477.9) Assessment: Deteriorated  Complete Medication List: 1)  Bayer Low Strength 81 Mg Tbec (Aspirin) .... Take 1 tablet by mouth once a day 2)  Levothyroxine Sodium 25 Mcg Tabs (Levothyroxine sodium) .... Take 1 tablet by mouth once a day 3)  Calcium 500/d 500-125 Mg-unit Tabs (Calcium carbonate-vitamin d) .... Take 1 tablet by mouth three times a day 4)  Multivitamins Tabs (Multiple vitamin) .... Take 1 tablet by mouth once a day 5)  Vitamin D (ergocalciferol) 50000 Unit Caps (Ergocalciferol) .... One tab by mouth q week 6)  Lorazepam 1 Mg Tabs (Lorazepam) .... One and half tab by mouth once daily as needed 7)  Ibuprofen 600 Mg Tabs  (Ibuprofen) .... Take 1 tablet by mouth three times a day 8)  Promethazine-codeine 6.25-10 Mg/46ml Syrp (Promethazine-codeine) .... Use 1-2 tsp every 6-8 hrs as needed for cough 9)  Hydromet 5-1.5 Mg/97ml Syrp (Hydrocodone-homatropine) .Marland Kitchen.. 1 tsp every 4 hrs as needed 10)  Prednisone (pak) 5 Mg Tabs (Prednisone) .... Use as directed , start today, October 28, 2009 11)  Tessalon Perles 100 Mg Caps (Benzonatate) .... Take 1 capsule by mouth three times a day 12)  Proventil Hfa 108 (90 Base) Mcg/act Aers (Albuterol sulfate) .... 2 puffs thee times daily for 1 week, then every 6 to 8 hrs as needed for excessive cough or wheezing  Patient Instructions: 1)  Please schedule a follow-up appointment in 1 month. 2)  You are being treated for uncontrolled allergic sinusitis and bronchitis , with bronchospasm. 3)  You will receive depomedrol in the office and aprednisone dose pack is sent in for 6 days. 4)  You will have a  breathing treatment in the office , and I would  like you to use an inhaler at home for the next week then as needed also, I will send it in. 5)  You need a CXR and aCT scan of your sinuses. 6)  Pls vcall back if you continue to cough excessively and your symptoms do not go away, I will refer you to either an allergist or a lung specialist at that time. Prescriptions: PROVENTIL HFA 108 (90 BASE) MCG/ACT AERS (ALBUTEROL SULFATE) 2 puffs thee times daily for 1 week, then every 6 to 8 hrs as needed for excessive cough or wheezing  #1 x 0   Entered and Authorized by:   Syliva Overman MD   Signed by:   Syliva Overman MD on 10/28/2009   Method used:   Electronically to        [facility]. [phone]* (retail)       [address]       [place], [place]  [address]       Ph: [phone] or [phone]       Fax: [phone]   RxID:   [id number] TESSALON PERLES 100 MG CAPS (BENZONATATE) Take 1 capsule by mouth three times a day  #21 x 0   Entered and Authorized by:   Syliva Overman MD   Signed by:   Syliva Overman MD on 10/28/2009   Method used:   Electronically to        [facility]. [phone]* (retail)       [address]       [place], [place]  [address]       Ph: [phone] or [phone]       Fax: [phone]   RxID:   [id number] PREDNISONE (PAK) 5 MG TABS (PREDNISONE) Use as directed , start today, October 28, 2009  #21 x 0   Entered and Authorized by:   Syliva Overman MD   Signed by:   Syliva Overman MD on 10/28/2009   Method used:   Electronically to        [facility]. [phone]* (retail)       [address]       [place], [place]  [address]       Ph: [phone] or [phone]       Fax: [phone]   RxID:   [id number]    Medication Administration  Injection # 1:    Medication: Depo- Medrol 80mg     Diagnosis: ACUTE BRONCHITIS (ICD-466.0)    Route: IM    Site: RUOQ gluteus    Exp Date: 06/2010    Lot #: [id number]    Mfr: Pharmacia    Comments: 80mg  given     Patient tolerated injection without complications    Given by: Everitt Amber LPN (October 28, 2009 3:02 PM)  Medication # 1:    Medication: Albuterol Sulfate Sol 1mg  unit dose    Diagnosis: ACUTE BRONCHITIS (ICD-466.0)    Dose: 2.5/53ml    Route: inhaled    Exp Date: 8/11    Lot #: [id number]    Mfr: nephron    Patient tolerated medication without complications    Given by: Everitt Amber LPN (October 28, 2009 3:03 PM)  Medication # 2:    Medication: Atrovent 1mg  (Neb)    Diagnosis: ACUTE BRONCHITIS (ICD-466.0)    Dose: 0.5/2.25ml    Route: inhaled  Exp Date: 8/11    Lot #: [id number]    Mfr: nephron     Patient tolerated medication without complications    Given by: Everitt Amber LPN (October 28, 2009 3:04 PM)  Orders Added: 1)  Est. Patient Level IV [91478] 2)  CXR- 2view [CXR] 3)  Radiology Referral [Radiology] 4)  Depo- Medrol 80mg  [J1040] 5)  Albuterol Sulfate Sol 1mg  unit dose [J7613] 6)  Atrovent 1mg  (Neb) [G9562] 7)  Nebulizer Tx  [94640] 8)  Admin of Therapeutic Inj  intramuscular or subcutaneous [13086]

## 2010-06-15 NOTE — Letter (Signed)
Summary: MED REVIEW SHEET SIGNED  MED REVIEW SHEET SIGNED   Imported By: Lind Guest 03/02/2010 08:25:27  _____________________________________________________________________  External Attachment:    Type:   Image     Comment:   External Document

## 2010-06-15 NOTE — Progress Notes (Signed)
Summary: referral cardiology  Phone Note Call from Patient   Summary of Call: pt has appt with dr. Tenny Craw for 10/09/2009 1:30. pt notified  Initial call taken by: Rudene Anda,  Sep 16, 2009 8:43 AM

## 2010-06-15 NOTE — Progress Notes (Signed)
Summary: RETURNING CALL  Phone Note Call from Patient   Summary of Call: RETURNED YOUR CALL PLEASE CALL HER BACK Initial call taken by: Lind Guest,  September 10, 2009 4:34 PM  Follow-up for Phone Call        I told her when I went in there to do the EKG to go nextdoor to have the stat bloodwork done and she said ok and then when you told me to let her check out with [facility], I told she was free to go and I would leave the results on the answering machine. When I talked to her this evening she said nobody told her to go to the lab. She is going first thing in the morning.  Follow-up by: Everitt Amber LPN,  September 10, 2009 4:51 PM

## 2010-06-15 NOTE — Progress Notes (Signed)
Summary: UTI  Phone Note Call from Patient   Summary of Call: HAS A UTI AND NEEDS SOMETHING CALLED IN AT [facility] [place] PLEASE HAS A VERY IMPORTANT MEETING TO  ATTEND AT 11:30  NEVER MIND COMIN IN Initial call taken by: Lind Guest,  March 05, 2010 8:18 AM  Follow-up for Phone Call        seen today  Follow-up by: Syliva Overman MD,  March 05, 2010 12:40 PM

## 2010-06-15 NOTE — Assessment & Plan Note (Signed)
Summary: office visit   Vital Signs:  Patient profile:   76 year old female Menstrual status:  hysterectomy Height:      65 inches Weight:      149 pounds BMI:     24.88 O2 Sat:      98 % Pulse rate:   85 / minute Pulse rhythm:   regular Resp:     16 per minute BP sitting:   120 / 78  (left arm)  Vitals Entered By: Everitt Amber LPN (July 08, 2009 8:12 AM) CC: Follow up chronic problems Is Patient Diabetic? No Pain Assessment Patient in pain? no        CC:  Follow up chronic problems.  History of Present Illness: Reports  that she has been doing well. Denies recent fever or chills. Denies sinus pressure, nasal congestion , ear pain or sore throat. Denies chest congestion, or cough productive of sputum. Denies chest pain, palpitations, PND, orthopnea or leg swelling. Denies abdominal pain, nausea, vomitting, diarrhea or constipation. Denies change in bowel movements or bloody stool. Denies dysuria , frequency, incontinence or hesitancy. Denies  joint pain, swelling, or reduced mobility. Denies headaches, vertigo, seizures. Denies depression, anxiety or insomnia. Denies  rash, lesions, or itch.     Current Medications (verified): 1)  Bayer Low Strength 81 Mg  Tbec (Aspirin) .... Take 1 Tablet By Mouth Once A Day 2)  Levothyroxine Sodium 25 Mcg  Tabs (Levothyroxine Sodium) .... Take 1 Tablet By Mouth Once A Day 3)  Calcium 500/d 500-125 Mg-Unit  Tabs (Calcium Carbonate-Vitamin D) .... Take 1 Tablet By Mouth Three Times A Day 4)  Multivitamins   Tabs (Multiple Vitamin) .... Take 1 Tablet By Mouth Once A Day 5)  Darpaz 81 Mg Tabs (Meth-Hyo-M Bl-Na Phos-Ph Sal) .... One Tab By Mouth Qid Prn 6)  Ibuprofen 800 Mg Tabs (Ibuprofen) .... Take 1 Tablet By Mouth Two Times A Day 7)  Vitamin D (Ergocalciferol) 50000 Unit Caps (Ergocalciferol) .... One Tab By Mouth Q Week 8)  Lorazepam 1 Mg Tabs (Lorazepam) .... One and Half Tab By Mouth Once Daily As Needed  Allergies  (verified): 1)  ! Pcn  Review of Systems      See HPI Eyes:  Denies blurring, discharge, and red eye. Heme:  Denies abnormal bruising and bleeding. Allergy:  Complains of seasonal allergies; mild.  Physical Exam  General:  Well-developed,well-nourished,in no acute distress; alert,appropriate and cooperative throughout examination HEENT: No facial asymmetry,  EOMI, No sinus tenderness, TM's Clear, oropharynx  pink and moist.   Chest: Clear to auscultation bilaterally.  CVS: S1, S2, No murmurs, No S3.   Abd: Soft, Nontender.  MS: Adequate ROM spine, hips, shoulders and knees.  Ext: No edema.   CNS: CN 2-12 intact, power tone and sensation normal throughout.   Skin: Intact, no visible lesions or rashes.  Psych: Good eye contact, normal affect.  Memory intact, not anxious or depressed appearing.    Impression & Recommendations:  Problem # 1:  VITAMIN D DEFICIENCY (ICD-268.9) Assessment Comment Only  Orders: T-Vitamin D (25-Hydroxy) (29528-41324)  Problem # 2:  DYSLIPIDEMIA (ICD-272.4) Assessment: Comment Only  Orders: T-Lipid Profile (40102-72536)  Labs Reviewed: SGOT: 23 (06/09/2008)   SGPT: 16 (06/09/2008)   HDL:93 (10/23/2008), 97 (06/09/2008)  LDL:96 (10/23/2008), 110 (64/40/3474)  Chol:200 (10/23/2008), 228 (06/09/2008)  Trig:54 (10/23/2008), 105 (06/09/2008)  Problem # 3:  ANXIETY, CHRONIC (ICD-300.00) Assessment: Improved  Her updated medication list for this problem includes:    Lorazepam 1  Mg Tabs (Lorazepam) ..... One and half tab by mouth once daily as needed  Complete Medication List: 1)  Bayer Low Strength 81 Mg Tbec (Aspirin) .... Take 1 tablet by mouth once a day 2)  Levothyroxine Sodium 25 Mcg Tabs (Levothyroxine sodium) .... Take 1 tablet by mouth once a day 3)  Calcium 500/d 500-125 Mg-unit Tabs (Calcium carbonate-vitamin d) .... Take 1 tablet by mouth three times a day 4)  Multivitamins Tabs (Multiple vitamin) .... Take 1 tablet by mouth once a  day 5)  Darpaz 81 Mg Tabs (Meth-hyo-m bl-na phos-ph sal) .... One tab by mouth qid prn 6)  Ibuprofen 800 Mg Tabs (Ibuprofen) .... Take 1 tablet by mouth two times a day 7)  Vitamin D (ergocalciferol) 50000 Unit Caps (Ergocalciferol) .... One tab by mouth q week 8)  Lorazepam 1 Mg Tabs (Lorazepam) .... One and half tab by mouth once daily as needed  Other Orders: T-Basic Metabolic Panel [id number]) T-TSH [id number]) T- Hemoglobin A1C ([id number])  Patient Instructions: 1)  Please schedule a follow-up appointment in 4 months. 2)  BMP prior to visit, ICD-9: 3)  Lipid Panel prior to visit, ICD-9:  fasting today 4)  TSH prior to visit, ICD-9: 5)  Vit D 6)  YOPU mAMOGRAM is due April 6 or after, pls call and schedule in March, if any probs have the officwe help you 7)  It is important that you exercise regularly at least 30 minutes 5 times a week. If you develop chest pain, have severe difficulty breathing, or feel very tired , stop exercising immediately and seek medical attention. 8)  Pls use OTC allergy tabs of your choice as needed 9)  Please use ocean spray/netty pot 2  to 3 times daily if nose uis congested

## 2010-06-17 NOTE — Letter (Signed)
[facility]   Imported By: Lind Guest 05/31/2010 11:22:49  _____________________________________________________________________  External Attachment:    Type:   Image     Comment:   External Document

## 2010-06-17 NOTE — Progress Notes (Signed)
Summary: question about medication  Phone Note Call from Patient   Summary of Call: patient called and asked if she was suppose to be on prednisone she thought Dr. Lodema Hong had discussed with patient yesterday about not taking prednisone.  Please advise. Initial call taken by: Curtis Sites,  May 13, 2010 8:53 AM  Follow-up for Phone Call        pt advised to take the prednisone diose pack, she agreees will call in am if still coughing excessively for tussionex, has appt with [place] next week Follow-up by: Syliva Overman MD,  May 13, 2010 5:34 PM

## 2010-06-17 NOTE — Progress Notes (Signed)
Summary: needs to see dr Karilyn Cota  Phone Note Call from Patient   Summary of Call: dr. Dennard Nip labeauer aller. spec.  wants her to be referred to dr Karilyn Cota for her throat dr. Dennard Nip lab said that someone could call and get the information so i got Rose [name] to sign the paper to release the information and i have faxed it over. Initial call taken by: Lind Guest,  May 19, 2010 8:52 AM  Follow-up for Phone Call        pls send for note from dr [place] so I canrwefer the pt, pls let her know we are working on this, no ov is in the electronic record yet, and as far as I know she had an appt with him klast week, Follow-up by: Syliva Overman MD,  May 24, 2010 2:54 PM  Additional Follow-up for Phone Call Additional follow up Details #1::        Patient aware, sent for notes Additional Follow-up by: Everitt Amber LPN,  May 24, 2010 3:09 PM

## 2010-06-17 NOTE — Progress Notes (Signed)
Summary: [place] office    Phone Note Call from Patient   Summary of Call: patient called Dr. Blossom Hoops office and they will send something over to [place] about the reason she needs to see Dr. Karilyn Cota. Clydie Braun at [facility] said he was out of the office for awhile.   Initial call taken by: Curtis Sites,  May 28, 2010 9:45 AM  Follow-up for Phone Call        HAS  ANYONE SEEN ANY PAPERS ON THIS FOR HER SO THIS APPT. CAN BE SCHEDULED Follow-up by: Lind Guest,  May 31, 2010 8:11 AM  Additional Follow-up for Phone Call Additional follow up Details #1::        i have not seen any papers Additional Follow-up by: Adella Hare LPN,  May 31, 2010 9:11 AM     Appended Document: [place] office   Sent over this information to Dr. Julaine Fusi office. They will call pt with appt and time.

## 2010-06-17 NOTE — Assessment & Plan Note (Signed)
Summary: work in bronchitis/slj   Vital Signs:  Patient profile:   76 year old female Menstrual status:  hysterectomy Height:      65 inches Weight:      148.75 pounds BMI:     24.84 O2 Sat:      95 % on Room air Pulse rate:   74 / minute Resp:     16 per minute BP sitting:   160 / 92  (left arm)  Vitals Entered By: Adella Hare LPN (May 12, 2010 3:59 PM)  O2 Flow:  Room air CC: cough, head congestion Is Patient Diabetic? No   Primary Care Rose [name]:  Syliva Overman MD  CC:  cough and head congestion.  History of Present Illness: Head and chest congestion with uncontrolled cough x 1 week, max sinus pressure.pt recently was treated forsimilar symptoms.She denies fever ,but has experienced chills , is fatigued ,and reports poor sleep because of poor sleep due to excessive cough. She was evaluate in the [facility] allergy/pulmonary for similar complaints, and it was alluded to that she may benefit from immunotherapy, my assesment is that these symptoms are allergy rather than infection based. t. Denies chest congestion, or cough productive of sputum. Denies chest pain, palpitations, PND, orthopnea or leg swelling. Denies abdominal pain, nausea, vomitting, diarrhea or constipation. Denies change in bowel movements or bloody stool. Denies dysuria , frequency, incontinence or hesitancy. Denies  joint pain, swelling, or reduced mobility. Denies headaches, vertigo, seizures. Denies depression, anxiety or insomnia. Denies  rash, lesions, or itch.      Current Medications (verified): 1)  Bayer Low Strength 81 Mg  Tbec (Aspirin) .... Take 1 Tablet By Mouth Once A Day 2)  Levothyroxine Sodium 25 Mcg  Tabs (Levothyroxine Sodium) .... Take 1 Tablet By Mouth Once A Day 3)  Calcium 500/d 500-125 Mg-Unit  Tabs (Calcium Carbonate-Vitamin D) .... Take 1 Tablet By Mouth Three Times A Day 4)  Multivitamins   Tabs (Multiple Vitamin) .... Take 1 Tablet By Mouth Once A Day 5)  Vitamin D  (Ergocalciferol) 50000 Unit Caps (Ergocalciferol) .... One Tab By Mouth Q Week 6)  Lorazepam 1 Mg Tabs (Lorazepam) .... One and Half Tab By Mouth Once Daily As Needed 7)  Proventil Hfa 108 (90 Base) Mcg/act Aers (Albuterol Sulfate) .... 2 Puffs Thee Times Daily For 1 Week, Then Every 6 To 8 Hrs As Needed For Excessive Cough or Wheezing 8)  Symbicort 80-4.5 Mcg/act Aero (Budesonide-Formoterol Fumarate) .... 2inhalations Twice Daily 9)  Citalopram Hydrobromide 10 Mg Tabs (Citalopram Hydrobromide) .... Take 1 Tablet By Mouth Once A Day 10)  Ibuprofen 600 Mg Tabs (Ibuprofen) .... Take 1 Tablet By Mouth Two Times A Day 11)  Hydroxyzine Hcl 10 Mg Tabs (Hydroxyzine Hcl) .... Two Tabs By Mouth At Bedtime 12)  Benzonatate 100 Mg Caps (Benzonatate) .... One Cap By Mouth Three Times A Day  Allergies (verified): 1)  ! Pcn  Review of Systems      See HPI General:  Complains of fatigue, malaise, and sleep disorder. Eyes:  Denies discharge, eye pain, and red eye. Psych:  Complains of anxiety and depression; denies mental problems, suicidal thoughts/plans, thoughts of violence, and unusual visions or sounds; improved with medication. Endo:  Denies cold intolerance, excessive hunger, excessive thirst, and excessive urination. Heme:  Denies abnormal bruising and bleeding. Allergy:  Complains of seasonal allergies; denies hives or rash and itching eyes.  Physical Exam  General:  Well-developed,well-nourished,in no acute distress; alert,appropriate and cooperative throughout examination  HEENT: No facial asymmetry,  EOMI, No sinus tenderness, TM's Clear, oropharynx  pink and moist. nasal mucosa erythematousand edematous  Chest: decreased air entry, bilateral wheezes CVS: S1, S2, No murmurs, No S3.   Abd: Soft, Nontender.  MS: Adequate ROM spine, hips, shoulders and knees.  Ext: No edema.   CNS: CN 2-12 intact, power tone and sensation normal throughout.   Skin: Intact, no visible lesions or rashes.    Psych: Good eye contact, normal affect.  Memory intact,  anxious d appearing.    Impression & Recommendations:  Problem # 1:  OTHER CHRONIC BRONCHITIS (ICD-491.8) Assessment Deteriorated  Orders: CXR- 2view (CXR) Medicare Electronic Prescription [id number]) Admin of Therapeutic Inj  intramuscular or subcutaneous (60454) Albuterol Sulfate Sol 1mg  unit dose (U9811) Ipratropium inhalation sol. unit dose (B1478) Nebulizer Tx (29562) Allergy Referral  (Allergy)  Problem # 2:  ALLERGIC RHINITIS (ICD-477.9) Assessment: Deteriorated  Problem # 3:  COUGH (ICD-786.2) Assessment: Deteriorated  Problem # 4:  ANXIETY, CHRONIC (ICD-300.00) Assessment: Improved  Her updated medication list for this problem includes:    Lorazepam 1 Mg Tabs (Lorazepam) ..... One and half tab by mouth once daily as needed    Citalopram Hydrobromide 10 Mg Tabs (Citalopram hydrobromide) .Marland Kitchen... Take 1 tablet by mouth once a day    Hydroxyzine Hcl 10 Mg Tabs (Hydroxyzine hcl) .Marland Kitchen..Marland Kitchen Two tabs by mouth at bedtime  Problem # 5:  HYPOTHYROIDISM (ICD-244.9) Assessment: Comment Only  Her updated medication list for this problem includes:    Levothyroxine Sodium 25 Mcg Tabs (Levothyroxine sodium) .Marland Kitchen... Take 1 tablet by mouth once a day  Labs Reviewed: TSH: 2.674 (03/19/2010)    Chol: 200 (09/11/2009)   HDL: 91 (09/11/2009)   LDL: 97 (09/11/2009)   TG: 61 (09/11/2009)  Complete Medication List: 1)  Bayer Low Strength 81 Mg Tbec (Aspirin) .... Take 1 tablet by mouth once a day 2)  Levothyroxine Sodium 25 Mcg Tabs (Levothyroxine sodium) .... Take 1 tablet by mouth once a day 3)  Calcium 500/d 500-125 Mg-unit Tabs (Calcium carbonate-vitamin d) .... Take 1 tablet by mouth three times a day 4)  Multivitamins Tabs (Multiple vitamin) .... Take 1 tablet by mouth once a day 5)  Vitamin D (ergocalciferol) 50000 Unit Caps (Ergocalciferol) .... One tab by mouth q week 6)  Lorazepam 1 Mg Tabs (Lorazepam) .... One and half tab by  mouth once daily as needed 7)  Proventil Hfa 108 (90 Base) Mcg/act Aers (Albuterol sulfate) .... 2 puffs thee times daily for 1 week, then every 6 to 8 hrs as needed for excessive cough or wheezing 8)  Symbicort 80-4.5 Mcg/act Aero (Budesonide-formoterol fumarate) .... 2inhalations twice daily 9)  Citalopram Hydrobromide 10 Mg Tabs (Citalopram hydrobromide) .... Take 1 tablet by mouth once a day 10)  Ibuprofen 600 Mg Tabs (Ibuprofen) .... Take 1 tablet by mouth two times a day 11)  Hydroxyzine Hcl 10 Mg Tabs (Hydroxyzine hcl) .... Two tabs by mouth at bedtime 12)  Benzonatate 100 Mg Caps (Benzonatate) .... One cap by mouth three times a day 13)  Prednisone (pak) 5 Mg Tabs (Prednisone) .... Use as directed 14)  Azithromycin 250 Mg Tabs (Azithromycin) .... Two tablets today and one tablet every day for the next four days  Other Orders: Depo- Medrol 80mg  (J1040)  Patient Instructions: 1)  Please schedule a follow-up appointment in 1 month. 2)  You will receive a breathing treatment and depomedrol 120mg  Im is being given in the office today. 3)  Steroid tablets  are also sent in and azithromycin. 4)  You are being set up to se Dr Corinda Gubler again , since the cough is recurrent. 5)  You may need allergy shots. 6)  cXR today. 7)  We will give you symbicort, pls use as diirected Prescriptions: AZITHROMYCIN 250 MG TABS (AZITHROMYCIN) two tablets today and one tablet every day for the next four days  #6 x 0   Entered and Authorized by:   Syliva Overman MD   Signed by:   Syliva Overman MD on 05/12/2010   Method used:   Electronically to        [facility] [phone]* (retail)       [address]       [place], [place]  [address]       Ph: [phone] or [phone]       Fax: [phone]   RxID:   [id number] PREDNISONE (PAK) 5 MG TABS (PREDNISONE) Use as directed  #21 x 0   Entered and Authorized by:   Syliva Overman MD   Signed by:   Syliva Overman MD on 05/12/2010    Method used:   Electronically to        [facility] [phone]* (retail)       [address]       [place], [place]  [address]       Ph: [phone] or [phone]       Fax: [phone]   RxID:   [id number]    Medication Administration  Injection # 1:    Medication: Depo- Medrol 80mg     Diagnosis: ACUTE BRONCHITIS (ICD-466.0)    Route: IM    Site: LUOQ gluteus    Exp Date: 07/12    Lot #: [id number]    Mfr: Pharmacia    Comments: depo medrol 120mg     Patient tolerated injection without complications    Given by: Adella Hare LPN (May 12, 2010 4:44 PM)  Medication # 1:    Medication: Albuterol Sulfate Sol 1mg  unit dose    Diagnosis: OTHER CHRONIC BRONCHITIS (ICD-491.8)    Dose: 2.5mg     Route: inhaled    Exp Date: 08/12    Lot #: [id number]    Mfr: nephron pharm    Patient tolerated medication without complications    Given by: Adella Hare LPN (May 12, 2010 4:44 PM)  Medication # 2:    Medication: Ipratropium inhalation sol. unit dose    Diagnosis: OTHER CHRONIC BRONCHITIS (ICD-491.8)    Dose: 0.5mg     Route: inhaled    Exp Date: 10/12    Lot #: [id number]    Mfr: nephron pharm    Patient tolerated medication without complications    Given by: Adella Hare LPN (May 12, 2010 4:45 PM)  Orders Added: 1)  CXR- 2view [CXR] 2)  Est. Patient Level IV [37106] 3)  Medicare Electronic Prescription [G8553] 4)  Depo- Medrol 80mg  [J1040] 5)  Admin of Therapeutic Inj  intramuscular or subcutaneous [96372] 6)  Albuterol Sulfate Sol 1mg  unit dose [J7613] 7)  Ipratropium inhalation sol. unit dose [J7644] 8)  Nebulizer Tx [94640] 9)  Allergy Referral  [Allergy]   symbicort 80/4.5 sample given [id number] 03/13  Medication Administration  Injection # 1:    Medication: Depo- Medrol 80mg     Diagnosis: ACUTE BRONCHITIS (ICD-466.0)    Route: IM    Site: LUOQ gluteus  Exp Date: 07/12    Lot #: Gunnar [id number]    Mfr: Pharmacia    Comments: depo medrol  120mg     Patient tolerated injection without complications    Given by: Adella Hare LPN (May 12, 2010 4:44 PM)  Medication # 1:    Medication: Albuterol Sulfate Sol 1mg  unit dose    Diagnosis: OTHER CHRONIC BRONCHITIS (ICD-491.8)    Dose: 2.5mg     Route: inhaled    Exp Date: 08/12    Lot #: [id number]    Mfr: nephron pharm    Patient tolerated medication without complications    Given by: Adella Hare LPN (May 12, 2010 4:44 PM)  Medication # 2:    Medication: Ipratropium inhalation sol. unit dose    Diagnosis: OTHER CHRONIC BRONCHITIS (ICD-491.8)    Dose: 0.5mg     Route: inhaled    Exp Date: 10/12    Lot #: [id number]    Mfr: nephron pharm    Patient tolerated medication without complications    Given by: Adella Hare LPN (May 12, 2010 4:45 PM)  Orders Added: 1)  CXR- 2view [CXR] 2)  Est. Patient Level IV [04540] 3)  Medicare Electronic Prescription [G8553] 4)  Depo- Medrol 80mg  [J1040] 5)  Admin of Therapeutic Inj  intramuscular or subcutaneous [96372] 6)  Albuterol Sulfate Sol 1mg  unit dose [J7613] 7)  Ipratropium inhalation sol. unit dose [J7644] 8)  Nebulizer Tx [94640] 9)  Allergy Referral  [Allergy]

## 2010-06-17 NOTE — Letter (Signed)
Summary: referral for Rose [name]  referral for Rose [name]   Imported By: Lind Guest 05/31/2010 11:29:47  _____________________________________________________________________  External Attachment:    Type:   Image     Comment:   External Document

## 2010-06-29 ENCOUNTER — Ambulatory Visit (INDEPENDENT_AMBULATORY_CARE_PROVIDER_SITE_OTHER): Payer: Medicare Other | Admitting: Internal Medicine

## 2010-06-29 ENCOUNTER — Other Ambulatory Visit (INDEPENDENT_AMBULATORY_CARE_PROVIDER_SITE_OTHER): Payer: Self-pay | Admitting: Internal Medicine

## 2010-06-29 DIAGNOSIS — Z800 Family history of malignant neoplasm of digestive organs: Secondary | ICD-10-CM

## 2010-06-29 DIAGNOSIS — R1310 Dysphagia, unspecified: Secondary | ICD-10-CM

## 2010-07-06 ENCOUNTER — Ambulatory Visit (HOSPITAL_COMMUNITY)
Admission: RE | Admit: 2010-07-06 | Discharge: 2010-07-06 | Disposition: A | Discharge status: Home / Self Care | Payer: Medicare Other | Source: Ambulatory Visit | Attending: Internal Medicine | Admitting: Internal Medicine

## 2010-07-06 DIAGNOSIS — R1310 Dysphagia, unspecified: Secondary | ICD-10-CM | POA: Insufficient documentation

## 2010-07-06 DIAGNOSIS — K449 Diaphragmatic hernia without obstruction or gangrene: Secondary | ICD-10-CM | POA: Insufficient documentation

## 2010-07-20 ENCOUNTER — Encounter: Payer: Self-pay | Admitting: Family Medicine

## 2010-07-22 ENCOUNTER — Ambulatory Visit: Payer: Medicare Other | Admitting: Family Medicine

## 2010-08-04 ENCOUNTER — Ambulatory Visit (HOSPITAL_COMMUNITY)
Admission: RE | Admit: 2010-08-04 | Discharge: 2010-08-04 | Disposition: A | Discharge status: Home / Self Care | Payer: Medicare Other | Source: Ambulatory Visit | Attending: Internal Medicine | Admitting: Internal Medicine

## 2010-08-04 ENCOUNTER — Encounter (HOSPITAL_BASED_OUTPATIENT_CLINIC_OR_DEPARTMENT_OTHER): Payer: Medicare Other | Admitting: Internal Medicine

## 2010-08-04 DIAGNOSIS — R1310 Dysphagia, unspecified: Secondary | ICD-10-CM

## 2010-08-04 DIAGNOSIS — K449 Diaphragmatic hernia without obstruction or gangrene: Secondary | ICD-10-CM

## 2010-08-04 DIAGNOSIS — K2100 Gastro-esophageal reflux disease with esophagitis, without bleeding: Secondary | ICD-10-CM | POA: Insufficient documentation

## 2010-08-07 ENCOUNTER — Other Ambulatory Visit: Payer: Self-pay | Admitting: Family Medicine

## 2010-08-09 ENCOUNTER — Other Ambulatory Visit (INDEPENDENT_AMBULATORY_CARE_PROVIDER_SITE_OTHER): Payer: Medicare Other | Admitting: *Deleted

## 2010-08-09 DIAGNOSIS — F329 Major depressive disorder, single episode, unspecified: Secondary | ICD-10-CM

## 2010-08-09 MED ORDER — CITALOPRAM HYDROBROMIDE 10 MG PO TABS: 10.0000 mg | ORAL_TABLET | Freq: Every day | ORAL | Status: DC

## 2010-08-12 ENCOUNTER — Other Ambulatory Visit: Payer: Self-pay | Admitting: Family Medicine

## 2010-08-12 DIAGNOSIS — Z139 Encounter for screening, unspecified: Secondary | ICD-10-CM

## 2010-08-17 NOTE — Op Note (Signed)
  [name], Rose [name]              ACCOUNT NO.:  [id number]  MEDICAL RECORD NO.:  [id number]           PATIENT TYPE:  O  LOCATION:  [facility]                          FACILITY:  [facility]  PHYSICIAN:  Lionel December, M.D.    DATE OF BIRTH:  [birth year]  DATE OF PROCEDURE: DATE OF DISCHARGE:                              OPERATIVE REPORT   PROCEDURE:  Esophagogastroduodenoscopy with esophageal dilation.  INDICATION:  [name] is a 76 year old African American female who has been experiencing intermittent dysphagia primarily to solids for over 3 months.  She states she experiences heartburn rarely.  She also has a feeling of secretions or in her throat pack or hacking cough.  She is undergoing diagnostic possibly therapeutic EGD.  Procedures were reviewed with the patient.  Informed consent was obtained.  MEDICATIONS FOR CONSCIOUS SEDATION:  Cetacaine spray for pharyngeal topical anesthesia, Demerol 50 mg IV, Versed 6 mg IV.  FINDINGS:  Procedure performed in endoscopy suite.  The patient's vital signs and O2 sat were monitored during the procedure, remained stable. The patient was placed in left lateral recumbent position and Pentax videoscope was passed through oropharynx without any difficulty into the esophagus, but she started to cough repeatedly, therefore scope was pulled out so that she could settle.  Endoscope was passed again.  Esophagus:  Mucosa of the esophagus was normal.  GE junction was serrated wavy with focal erythema but no obvious ring or stricture was noted.  GE junction was at 37 and hiatus was 39.  Stomach was empty and distended, very well insufflation.  Folds of proximal stomach are normal.  Examination of mucosa at body, antrum, pyloric channel as well as angularis, fundus, and cardia was normal.  Duodenum:  Bulbar mucosa was normal.  Scope was passed into the second part of duodenal mucosa and folds were normal.  Endoscope was withdrawn.  Esophagus was dilated by  passing 50-French Maloney dilator to full insertion.  As the dilators were withdrawn, endoscope was passed again. Esophageal mucosa reexamined and no disruption noted.  The patient tolerated the procedure well.  FINAL DIAGNOSIS:  Small sliding hiatal hernia with mild changes of reflux esophagitis, limited gastroesophageal junction, but no evidence of stricture, ring, or web.  Esophagus dilated by passing 54-French Maloney dilator.  RECOMMENDATIONS:  Antireflux measures.  We will start on pantoprazole 40 mg p.o. q.a.m. since she may be a silent reflux.  The patient given prescription for 30 doses 5 refills.  The patient will call us with progress report in 1 week.  If symptoms persist, she will need further evaluation.     Lionel December, M.D.     NR/MEDQ  D:  08/04/2010  T:  08/04/2010  Job:  [id number]  Electronically Signed by Lionel December M.D. on 08/17/2010 12:51:58 PM

## 2010-08-24 ENCOUNTER — Telehealth: Payer: Self-pay | Admitting: Family Medicine

## 2010-08-25 ENCOUNTER — Encounter: Payer: Self-pay | Admitting: Family Medicine

## 2010-08-25 ENCOUNTER — Telehealth: Payer: Self-pay | Admitting: Family Medicine

## 2010-08-25 NOTE — Telephone Encounter (Signed)
If we have a cancellatio today pls put her in, if none today pls add her tomorrow, thanks, let her know the plan pls

## 2010-08-25 NOTE — Telephone Encounter (Signed)
Patient called back in today and the message was sent to Dr. Lodema Hong

## 2010-08-26 ENCOUNTER — Ambulatory Visit (INDEPENDENT_AMBULATORY_CARE_PROVIDER_SITE_OTHER): Payer: Medicare Other | Admitting: Family Medicine

## 2010-08-26 ENCOUNTER — Encounter: Payer: Self-pay | Admitting: Family Medicine

## 2010-08-26 VITALS — BP 150/90 | HR 63 | Resp 16 | Ht 66.5 in | Wt 148.0 lb

## 2010-08-26 DIAGNOSIS — M25569 Pain in unspecified knee: Secondary | ICD-10-CM

## 2010-08-26 DIAGNOSIS — F411 Generalized anxiety disorder: Secondary | ICD-10-CM

## 2010-08-26 DIAGNOSIS — F419 Anxiety disorder, unspecified: Secondary | ICD-10-CM

## 2010-08-26 DIAGNOSIS — K219 Gastro-esophageal reflux disease without esophagitis: Secondary | ICD-10-CM

## 2010-08-26 DIAGNOSIS — E039 Hypothyroidism, unspecified: Secondary | ICD-10-CM

## 2010-08-26 DIAGNOSIS — M25562 Pain in left knee: Secondary | ICD-10-CM

## 2010-08-26 DIAGNOSIS — J309 Allergic rhinitis, unspecified: Secondary | ICD-10-CM

## 2010-08-26 MED ORDER — CITALOPRAM HYDROBROMIDE 10 MG PO TABS: 10.0000 mg | ORAL_TABLET | Freq: Every day | ORAL | Status: DC

## 2010-08-26 MED ORDER — BUSPIRONE HCL 7.5 MG PO TABS: 7.5000 mg | ORAL_TABLET | Freq: Three times a day (TID) | ORAL | Status: DC

## 2010-08-26 MED ORDER — DEXLANSOPRAZOLE 60 MG PO CPDR: 60.0000 mg | DELAYED_RELEASE_CAPSULE | Freq: Every day | ORAL | Status: DC

## 2010-08-26 MED ORDER — DEXLANSOPRAZOLE 60 MG PO CPDR: 60.0000 mg | DELAYED_RELEASE_CAPSULE | Freq: Every day | ORAL | Status: AC

## 2010-08-26 MED ORDER — MELOXICAM 7.5 MG PO TABS: 7.5000 mg | ORAL_TABLET | Freq: Every day | ORAL | Status: DC

## 2010-08-26 NOTE — Progress Notes (Signed)
  Subjective:    Patient ID: Rose [name], female    DOB: [birth year], 76 y.o.   MRN: [id number]  HPI  Pt states she never had upper endoscopy, has no recall of it, states she is still having food sticking states within the past week she received a call from GI telling her not to take protonix, and to take the dexilant which had been prescribed by her allergist, she is confused as to what she should do, I advised her to follow directions from gI which were concordant with those from her allergist. I reviewed the upper endo rept with her explaining she did indeed have the procedure, she is still not convinced, and does not want this discussed with the provider who she holds in high esteem.She does state she thinks her nerves are out of control and requests additional med.   Review of Systems See HPI Denies recent fever or chills. Denies sinus pressure, nasal congestion, ear pain or sore throat. Denies chest congestion, productive cough or wheezing. Denies chest pains, palpitations, paroxysmal nocturnal dyspnea, orthopnea and leg swelling t. Denies dysuria, frequency, hesitancy or incontinence. Denies joint pain, swelling and limitation in mobility. Denies headaches, seizure, numbness, or tingling. Denies skin break down or rash.        Objective:   Physical Exam Patient alert and oriented and in no Cardiopulmonary distress.  HEENT: No facial asymmetry, EOMI, no sinus tenderness, TM's clear, Oropharynx pink and moist.  Neck supple no adenopathy.  Chest: Clear to auscultation bilaterally.  CVS: S1, S2 no murmurs, no S3.  ABD: Soft non tender. Bowel sounds normal.  Ext: No edema  MS: Adequate ROM spine, shoulders, hips and knees.  Skin: Intact, no ulcerations or rash noted.  Psych: Good eye contact, normal affect. Memory intact  anxious not depressed appearing.  CNS: CN 2-12 intact, power, tone and sensation normal throughout.        Assessment & Plan:

## 2010-08-26 NOTE — Patient Instructions (Addendum)
F/u in 2 months.  I am sending in a new medication for your chronic anxiety. Medication is sent in for your knee (left)

## 2010-08-30 ENCOUNTER — Ambulatory Visit (HOSPITAL_COMMUNITY)
Admission: RE | Admit: 2010-08-30 | Discharge: 2010-08-30 | Disposition: A | Discharge status: Home / Self Care | Payer: Medicare Other | Source: Ambulatory Visit | Attending: Family Medicine | Admitting: Family Medicine

## 2010-08-30 DIAGNOSIS — Z139 Encounter for screening, unspecified: Secondary | ICD-10-CM

## 2010-08-30 DIAGNOSIS — Z1231 Encounter for screening mammogram for malignant neoplasm of breast: Secondary | ICD-10-CM | POA: Insufficient documentation

## 2010-09-16 ENCOUNTER — Emergency Department (HOSPITAL_COMMUNITY): Payer: Medicare Other

## 2010-09-16 ENCOUNTER — Emergency Department (HOSPITAL_COMMUNITY)
Admission: EM | Admit: 2010-09-16 | Discharge: 2010-09-16 | Disposition: A | Discharge status: Home / Self Care | Payer: Medicare Other | Attending: Emergency Medicine | Admitting: Emergency Medicine

## 2010-09-16 DIAGNOSIS — M25569 Pain in unspecified knee: Secondary | ICD-10-CM | POA: Insufficient documentation

## 2010-09-16 DIAGNOSIS — M25469 Effusion, unspecified knee: Secondary | ICD-10-CM | POA: Insufficient documentation

## 2010-09-16 DIAGNOSIS — Z79899 Other long term (current) drug therapy: Secondary | ICD-10-CM | POA: Insufficient documentation

## 2010-09-22 DIAGNOSIS — K219 Gastro-esophageal reflux disease without esophagitis: Secondary | ICD-10-CM | POA: Insufficient documentation

## 2010-09-22 DIAGNOSIS — M25562 Pain in left knee: Secondary | ICD-10-CM | POA: Insufficient documentation

## 2010-09-22 NOTE — Assessment & Plan Note (Signed)
Unchanged, still symptomatic, encouraged pt to continue current teratment plan per GI

## 2010-09-22 NOTE — Assessment & Plan Note (Addendum)
Currently stable.

## 2010-09-22 NOTE — Assessment & Plan Note (Signed)
deteriorated ibuprofen prescribed

## 2010-09-22 NOTE — Assessment & Plan Note (Signed)
Deteriorated, will add buspar

## 2010-09-22 NOTE — Assessment & Plan Note (Signed)
Controlled no med change 

## 2010-10-01 NOTE — Consult Note (Signed)
Rose [name], Rose [name]                          ACCOUNT NO.:  [id number]   MEDICAL RECORD NO.:  [id number]                   PATIENT TYPE:   LOCATION:                                       FACILITY:   PHYSICIAN:  Aundra Dubin, M.D.            DATE OF BIRTH:   DATE OF CONSULTATION:  03/21/2002  DATE OF DISCHARGE:                                   CONSULTATION   CHIEF COMPLAINT:  Legs and right thumb.   HISTORY OF PRESENT ILLNESS:  The patient is a 76 year old woman whom I last  saw in April 2003 when I injected her right second PIP for OA.  She has had  a good result of this.  She has had some skin changes secondary to the  cortisone with hypopigmentation.  The right thumb distal IP joint is now  popping and hurts as it clicks.  It is not swollen.  If she does not move  it, it does not hurt.  Generally, the arthritis to her hand is not severe at  the present time.  Another problem is pain in her legs at night.  This is an  aching, throbbing sensation.  There is no report of restlessness to the  legs.  She finds that warmth will help the legs before she goes to bed.  She  does not have pain in the legs during the day.  Arising at night to walk  does not seem to relieve this.  The calves are worse than the thighs.  There  have been no swollen joints except for the chronic swelling at the fingers.  Her weight is stable.  There has been no recent URI, fever, cough, nausea,  or stomatitis.  She is not having diarrhea at this time.  She reports her  back is doing well.   PAST MEDICAL HISTORY:  I reviewed my notes from February and April 2003.  She has a history of diverticulosis, anxiety, cholecystectomy in 1978,  hysterectomy.   MEDICATIONS:  1. Dicyclomine 20 mg q.d.  2. Lorazepam 1 mg h.s. p.r.n.  3. Tylenol Arthritis p.r.n.  4. Multivitamin q.d.  5. Caltrate 600 mg q.d.   PHYSICAL EXAMINATION:  VITAL SIGNS:  Weight 163 pounds, blood pressure  120/80, respirations 16.  GENERAL:  She appears well.  SKIN:  She has a streak of hypopigmentation across the right second PIP.  NECK:  Negative JVD.  LUNGS:  Clear.  HEART:  Regular.  No murmur.  EXTREMITIES:  Lower extremities:  No edema.  MUSCULOSKELETAL:  The hands showed nodularity consistent with OA with knobby  DIP's and PIP's.  The right thumb IP joint pops as it is flexed and  extended.  The joint is not swollen and is nontender with palpation.  The  wrists, elbows, shoulders, hips, knees, ankles, and feet all have a painless  full range of motion and show no synovitis.  Palpation into the  thighs is  nontender.  Palpation into the right calf finds that it is cool, not  swollen, but moderately tender.  The left calf was less tender.    ASSESSMENT AND PLAN:  1. Right thumb tendonitis.  I believe that she is experiencing a tracking     problem with the tendons.  This is not a joint that an injection would     help.  I have offered to set her up with a hand orthopedist but she has     been to many physicians recently and would not prefer this presently.  I     do not really know what further will help this.  2. Leg pain at night.  I will try her on [place] 1-2 at night p.r.n.  This     typically helps with leg cramps but her vague aching is hard to fully     diagnose.  If this is not helping, then I may consider x-raying the back     and further consider an MRI.   She will return on a p.r.n. basis per her request.                                               Aundra Dubin, M.D.    WWT/MEDQ  D:  03/21/2002  T:  03/21/2002  Job:  [id number]   cc:   Annia Friendly. Loleta Chance, M.D.  [address]  [place]  [place] [address]  Fax: [phone]

## 2010-10-01 NOTE — Group Therapy Note (Signed)
   [name], Rose [name]                          ACCOUNT NO.:  [id number]   MEDICAL RECORD NO.:  [id number]                   PATIENT TYPE:   LOCATION:                                       FACILITY:   PHYSICIAN:  [place] Bing, M.D. [facility]           DATE OF BIRTH:  [birth year]   DATE OF PROCEDURE:  02/28/2002  DATE OF DISCHARGE:                                   PROGRESS NOTE   PROCEDURE:  Exercise Cardiolite.   BRIEF HISTORY:  This patient was recently evaluated in the office by Dr.  Dietrich Pates for chest pain.  She has no previous history of coronary artery  disease.  She is 76 years old, but has been in good health.  She is status  post hysterectomy as well as laparoscopic cholecystectomy and she has had  some urinary tract infections; otherwise she has been, for the most part,  healthy.  She exercises on a regular basis by walking.   DESCRIPTION OF PROCEDURE:  Prior to the study, today the patient had no  chest pain or shortness of breath.  Her baseline EKG showed sinus rhythm  with inferior Q waves as well as T wave inversions in III, V3, and V4.  Her  blood pressure is 124/80.  Her target heart rate was 131 beats/minute.   The patient tolerated the exercise well.  She was injected at 5 minutes and  58 seconds into the study at which time his heart rate was 126 beats/minute.  She exercised for just over 7 minutes reaching a maximum heart rate of  approximately 132.   The patient had some fatigue and dyspnea.  She had some mild chest tightness  in recovery which did resolve on its own without specific treatment.  There  were no significant EKG changes. The final images are pending at the time of  this dictation.     Delton See, P.A. [facility]                   Bing, M.D. [facility]    DR/MEDQ  D:  02/28/2002  T:  03/01/2002  Job:  [id number]   cc:   Annia Friendly. Loleta Chance, M.D.  [id number]  [place]  [place] [address]  Fax: [phone]

## 2010-10-01 NOTE — H&P (Signed)
[facility]  Patient:    Rose [name], Rose [name] Visit Number: [id number] MRN: [id number]          Service Type: OUT Location: RAD Attending Physician:  Evlyn Courier Dictated by:   Nathaneil Canary, M.D. Proc. Date: 08/16/01 Admit Date:  07/27/2001 Discharge Date: 07/27/2001   CC:         Mirna Mires, M.D.   History and Physical  CHIEF COMPLAINT:  Osteoarthritis.  BRIEF HISTORY:  The patient is a lady whom I first saw in my [place] office on July 04, 2001.  She has considerable degenerative arthritis changes to her fingers which were her worse joint areas.  She says the right second PIP is the worse and most painful joint.  The vague left leg pain that she was telling me about in February has gradually improved and is a minimal problem at this time.  She used the several NSAIDs that I gave her and found that the celebrex seemed to work fairly well.  She only uses this about twice per week because of concerns this may activate her diverticulosis.  Her weight is stable.  There has no URIs, fever, cough, nausea, or stomatitis.  MEDICATIONS: 1. Celebrex 200 mg p.r.n. 2. Ativan 1 mg p.r.n. 3. Caltrate b.i.d. 4. Remifemin 20 mg h.s. 5. Tylenol 650 mg q. day. 6. Multivitamin  PHYSICAL EXAMINATION:  Weight 164 pounds, blood pressure 116/60, respirations 14.  GENERAL:  No acute distress.  MUSCULOSKELETAL:  She has the decided Heberden nodes to most of the DIPs.  The right second PIP is hypertrophic and quite tender.  Other PIPs have mild swelling and are nontender.  MCPs are nontender. Wrists, elbows, shoulders, knees, ankles and feet have a good range of motion and are all nontender.  PROCEDURE:  Right second PIP injection for osteoarthritis.  The skin was cleaned with multiple alcohol swabs and cooled with ethyl chloride.  Using a 27 gauge needle I injected 40 mg of Depo-Medrol only to the joint.  ASSESSMENT AND PLAN: 1. OSTEOARTHRITIS:   Hopefully the above injection will calm down the most    active joint. Typically I see that the patient significantly improves for 4    to 6 months if not longer.  She will continue with the celebrex as above.    I did suggest that she might try it every other day with cautions about her    bowels. She can also use Tylenol 650 mg 2 pills, q.d. - b.i.d. p.r.n.  I believe her problems are stable and I will return her to the care of Dr. Loleta Chance and will be glad to work with her in the future as needed.  She will return on a p.r.n. basis. Dictated by:   Nathaneil Canary, M.D. Attending Physician:  Evlyn Courier DD:  08/16/01 TD:  08/16/01 Job: [id number]

## 2010-10-06 ENCOUNTER — Other Ambulatory Visit: Payer: Self-pay | Admitting: Family Medicine

## 2010-10-22 ENCOUNTER — Encounter: Payer: Self-pay | Admitting: Family Medicine

## 2010-10-26 ENCOUNTER — Ambulatory Visit (INDEPENDENT_AMBULATORY_CARE_PROVIDER_SITE_OTHER): Payer: Medicare Other | Admitting: Family Medicine

## 2010-10-26 ENCOUNTER — Encounter: Payer: Self-pay | Admitting: Family Medicine

## 2010-10-26 VITALS — BP 122/70 | HR 43 | Resp 16 | Ht 66.0 in | Wt 143.1 lb

## 2010-10-26 DIAGNOSIS — F329 Major depressive disorder, single episode, unspecified: Secondary | ICD-10-CM

## 2010-10-26 DIAGNOSIS — F411 Generalized anxiety disorder: Secondary | ICD-10-CM

## 2010-10-26 DIAGNOSIS — M25562 Pain in left knee: Secondary | ICD-10-CM

## 2010-10-26 DIAGNOSIS — M25569 Pain in unspecified knee: Secondary | ICD-10-CM

## 2010-10-26 DIAGNOSIS — K219 Gastro-esophageal reflux disease without esophagitis: Secondary | ICD-10-CM

## 2010-10-26 DIAGNOSIS — E039 Hypothyroidism, unspecified: Secondary | ICD-10-CM

## 2010-10-26 MED ORDER — MELOXICAM 7.5 MG PO TABS: 7.5000 mg | ORAL_TABLET | Freq: Every day | ORAL | Status: DC

## 2010-10-26 NOTE — Progress Notes (Signed)
  Subjective:    Patient ID: Rose [name], female    DOB: Dec 26, 1934, 76 y.o.   MRN: [id number]  HPI Left knee pain acutely approx 4 weeks ago could not weight bear on it , seen in ed  then ortho locally got injection, helped for a few weeks but still having pain, and soreness, shehas been taking tylenol 650mg  daily, never took the naproxen states she felt sick with it Reports improvement with anxiety and depression on her current meds, is doing more for herself, and is able to say no when asked to be involved in activities.   Review of Systems Denies recent fever or chills. Denies sinus pressure, nasal congestion, ear pain or sore throat. Denies chest congestion, productive cough or wheezing. Denies chest pains, palpitations, paroxysmal nocturnal dyspnea, orthopnea and leg swelling Reports improvement in reflux symptoms . Denies dysuria, frequency, hesitancy or incontinence.  Denies headaches, seizure, numbness, or tingling. Denies depression, anxiety or insomnia. Denies skin break down or rash.        Objective:   Physical Exam Patient alert and oriented and in no Cardiopulmonary distress.  HEENT: No facial asymmetry, EOMI, no sinus tenderness, TM's clear, Oropharynx pink and moist.  Neck supple no adenopathy.  Chest: Clear to auscultation bilaterally.  CVS: S1, S2 no murmurs, no S3.  ABD: Soft non tender. Bowel sounds normal.  Ext: No edema  MS: Adequate ROM spine, shoulders, hips and  Reduced in left knee which is swollen ad tender with crepitus  Skin: Intact, no ulcerations or rash noted.  Psych: Good eye contact, normal affect. Memory intact not anxious or depressed appearing.  CNS: CN 2-12 intact, power, tone and sensation normal throughout.        Assessment & Plan:

## 2010-10-26 NOTE — Patient Instructions (Addendum)
F/u in 4 months  Pls take Mobic daily for 10 days, and arthritis pain 1 at bedtime for 10 days.If this works then no other referral needed , if not I will refer you a different orthopod  tsh TODAY

## 2010-10-27 DIAGNOSIS — F329 Major depressive disorder, single episode, unspecified: Secondary | ICD-10-CM | POA: Insufficient documentation

## 2010-10-27 LAB — TSH: TSH: 2.543 u[IU]/mL (ref 0.350–4.500)

## 2010-10-27 NOTE — Assessment & Plan Note (Signed)
Improved, pt controled on current med. Pt agrees in no uncertain terms at this time that upper endoscopy had been performed

## 2010-10-27 NOTE — Progress Notes (Signed)
Called patient left message

## 2010-10-27 NOTE — Assessment & Plan Note (Signed)
rept lab to establish control on current med

## 2010-10-27 NOTE — Assessment & Plan Note (Signed)
Acute episode 1 month ago, still limiting mobility, will attempt aggressive anti-inflammatory regime orally, if fails, then needs to see ortho again

## 2010-10-27 NOTE — Assessment & Plan Note (Signed)
Improved on current med will continue

## 2010-10-27 NOTE — Assessment & Plan Note (Signed)
Significantly improved on medication, no dose change or therapy at this time

## 2010-11-20 ENCOUNTER — Other Ambulatory Visit: Payer: Self-pay | Admitting: Family Medicine

## 2010-12-08 ENCOUNTER — Other Ambulatory Visit: Payer: Self-pay | Admitting: Family Medicine

## 2011-01-25 ENCOUNTER — Other Ambulatory Visit (INDEPENDENT_AMBULATORY_CARE_PROVIDER_SITE_OTHER): Payer: Self-pay | Admitting: Internal Medicine

## 2011-02-07 ENCOUNTER — Other Ambulatory Visit: Payer: Self-pay | Admitting: Family Medicine

## 2011-02-22 ENCOUNTER — Encounter: Payer: Self-pay | Admitting: Family Medicine

## 2011-02-25 ENCOUNTER — Ambulatory Visit (INDEPENDENT_AMBULATORY_CARE_PROVIDER_SITE_OTHER): Payer: Medicare Other | Admitting: Family Medicine

## 2011-02-25 ENCOUNTER — Encounter: Payer: Self-pay | Admitting: Family Medicine

## 2011-02-25 ENCOUNTER — Other Ambulatory Visit: Payer: Self-pay | Admitting: Family Medicine

## 2011-02-25 VITALS — BP 140/70 | HR 65 | Resp 16 | Ht 66.0 in | Wt 143.1 lb

## 2011-02-25 DIAGNOSIS — F329 Major depressive disorder, single episode, unspecified: Secondary | ICD-10-CM

## 2011-02-25 DIAGNOSIS — R5383 Other fatigue: Secondary | ICD-10-CM

## 2011-02-25 DIAGNOSIS — R5381 Other malaise: Secondary | ICD-10-CM

## 2011-02-25 DIAGNOSIS — E785 Hyperlipidemia, unspecified: Secondary | ICD-10-CM

## 2011-02-25 DIAGNOSIS — J309 Allergic rhinitis, unspecified: Secondary | ICD-10-CM

## 2011-02-25 DIAGNOSIS — F411 Generalized anxiety disorder: Secondary | ICD-10-CM

## 2011-02-25 DIAGNOSIS — E039 Hypothyroidism, unspecified: Secondary | ICD-10-CM

## 2011-02-25 NOTE — Patient Instructions (Addendum)
CPE in mid January.  Fasting labs in January before visit.CPC, fasting, chem 7, lipid, TSH   I am thankful that you are doing much better.  No medication changes at this time  Pls follow a diet rich in fruit and vegetable, and low in sodium

## 2011-02-27 NOTE — Assessment & Plan Note (Signed)
Hyperlipidemia:Low fat diet discussed and encouraged.  Updated labs prior to next visit

## 2011-02-27 NOTE — Assessment & Plan Note (Signed)
Improved and stable 

## 2011-02-27 NOTE — Progress Notes (Signed)
  Subjective:    Patient ID: Rose [name], female    DOB: Jun 06, 1934, 76 y.o.   MRN: [id number]  HPI The PT is here for follow up and re-evaluation of chronic medical conditions, medication management and review of any available recent lab and radiology data.  Preventive health is updated, specifically  Cancer screening and Immunization.   Questions or concerns regarding consultations or procedures which the PT has had in the interim are  addressed. The PT denies any adverse reactions to current medications since the last visit.  Pt reports recent stress requiring hospitalization of her spouse for excessive bleeding. States she is dealing with her chronic anxiety much better, and has learned and accepts tha "no" is a good word       Review of Systems Controlled, no change in medication Denies recent fever or chills. Denies sinus pressure, nasal congestion, ear pain or sore throat. Denies chest congestion, productive cough or wheezing. Denies chest pains, palpitations and leg swelling Denies abdominal pain, nausea, vomiting,diarrhea or constipation.   Denies dysuria, frequency, hesitancy or incontinence. Denies joint pain, swelling and limitation in mobility. Denies headaches, seizures, numbness, or tingling. Denies depression, anxiety or insomnia. Denies skin break down or rash.        Objective:   Physical Exam Patient alert and oriented and in no cardiopulmonary distress.  HEENT: No facial asymmetry, EOMI, no sinus tenderness,  oropharynx pink and moist.  Neck supple no adenopathy.  Chest: Clear to auscultation bilaterally.  CVS: S1, S2 no murmurs, no S3.  ABD: Soft non tender. Bowel sounds normal.  Ext: No edema  MS: Adequate ROM spine, shoulders, hips and knees.  Skin: Intact, no ulcerations or rash noted.  Psych: Good eye contact, normal affect. Memory intact not anxious or depressed appearing.  CNS: CN 2-12 intact, power, tone and sensation normal  throughout.        Assessment & Plan:

## 2011-02-27 NOTE — Assessment & Plan Note (Signed)
Controlled, no change in medication  

## 2011-02-27 NOTE — Assessment & Plan Note (Signed)
Improved and controlled on current medication, continue same 

## 2011-04-01 ENCOUNTER — Ambulatory Visit (INDEPENDENT_AMBULATORY_CARE_PROVIDER_SITE_OTHER): Payer: Medicare Other | Admitting: Family Medicine

## 2011-04-01 ENCOUNTER — Encounter: Payer: Self-pay | Admitting: Family Medicine

## 2011-04-01 VITALS — BP 130/80 | HR 63 | Temp 97.9°F | Resp 16 | Ht 66.0 in | Wt 146.0 lb

## 2011-04-01 DIAGNOSIS — F411 Generalized anxiety disorder: Secondary | ICD-10-CM

## 2011-04-01 DIAGNOSIS — J209 Acute bronchitis, unspecified: Secondary | ICD-10-CM

## 2011-04-01 DIAGNOSIS — M25569 Pain in unspecified knee: Secondary | ICD-10-CM

## 2011-04-01 DIAGNOSIS — E039 Hypothyroidism, unspecified: Secondary | ICD-10-CM

## 2011-04-01 DIAGNOSIS — J019 Acute sinusitis, unspecified: Secondary | ICD-10-CM

## 2011-04-01 MED ORDER — SULFAMETHOXAZOLE-TRIMETHOPRIM 800-160 MG PO TABS: 1.0000 | ORAL_TABLET | Freq: Two times a day (BID) | ORAL | Status: AC

## 2011-04-01 MED ORDER — DICLOFENAC SODIUM 1 % TD GEL: TRANSDERMAL | Status: DC

## 2011-04-01 MED ORDER — DICLOFENAC SODIUM 3 % TD GEL: TRANSDERMAL | Status: DC

## 2011-04-01 MED ORDER — BENZONATATE 100 MG PO CAPS: 100.0000 mg | ORAL_CAPSULE | Freq: Four times a day (QID) | ORAL | Status: DC | PRN

## 2011-04-01 NOTE — Assessment & Plan Note (Signed)
Acute episode, antibiotic prescribed 

## 2011-04-01 NOTE — Patient Instructions (Addendum)
F/u as before  You are being treated for sinusitis, laryngitis and bronchitis. Antibiotics and decongestants are sent to your pharmacy. Voice rest, fluids and bed rest will also help.   New medication, a gel to be applied to the left knee , as needed for pain. Stop meloxicam. OK to take OTC tylenol 1 dailly also, if needed , for arhiritis pain  I believe you will soon feel much better, call if not doing well please

## 2011-04-01 NOTE — Assessment & Plan Note (Signed)
Improved and controlled on current medication 

## 2011-04-01 NOTE — Assessment & Plan Note (Signed)
Controlled, no change in medication  

## 2011-04-01 NOTE — Assessment & Plan Note (Signed)
Persistent left knee pain, pt reassured that based on symptoms, she does not require surgery at this time

## 2011-04-01 NOTE — Progress Notes (Signed)
  Subjective:    Patient ID: Rose [name], female    DOB: 17-Sep-1934, 76 y.o.   MRN: [id number]  HPI 1 week h/o increased head and chest congestion, hoarseness, cough , nasal drainage is green and sputum is green. No fever or chills C/o left knee pain and swelling at times wonders if she needs surgery, denies instability or disabling pain, will switch to topical anti inflammatories C/odry skin   Review of Systems See HPI Denies chest pains, palpitations and leg swelling Denies abdominal pain, nausea, vomiting,diarrhea or constipation.   Denies dysuria, frequency, hesitancy or incontinence.  Denies headaches, seizures, numbness, or tingling. Denies depression, anxiety or insomnia. Denies skin break down or rash.        Objective:   Physical Exam Patient alert and oriented and in no cardiopulmonary distress.  HEENT: No facial asymmetry, EOMI, ethmoid  sinus tenderness,  oropharynx pink and moist.  Neck supple no adenopathy.  Chest: decreased air entry scattered crackles CVS: S1, S2 no murmurs, no S3.  ABD: Soft non tender. Bowel sounds normal.  Ext: No edema  MS: Adequate ROM spine, shoulders, hips and slightly decreased in left  knee.  Skin: Intact, no ulcerations or rash noted.  Psych: Good eye contact, normal affect. Memory intact not anxious or depressed appearing.  CNS: CN 2-12 intact, power, tone and sensation normal throughout.        Assessment & Plan:

## 2011-04-01 NOTE — Assessment & Plan Note (Signed)
Antibiotic prescribed 

## 2011-05-31 DIAGNOSIS — E039 Hypothyroidism, unspecified: Secondary | ICD-10-CM | POA: Diagnosis not present

## 2011-05-31 DIAGNOSIS — E785 Hyperlipidemia, unspecified: Secondary | ICD-10-CM | POA: Diagnosis not present

## 2011-05-31 DIAGNOSIS — R5381 Other malaise: Secondary | ICD-10-CM | POA: Diagnosis not present

## 2011-06-01 ENCOUNTER — Encounter: Payer: Self-pay | Admitting: Family Medicine

## 2011-06-01 LAB — BASIC METABOLIC PANEL
BUN: 8 mg/dL (ref 6–23)
Calcium: 9.8 mg/dL (ref 8.4–10.5)
Glucose, Bld: 88 mg/dL (ref 70–99)

## 2011-06-01 LAB — CBC WITH DIFFERENTIAL/PLATELET
Basophils Relative: 1 % (ref 0–1)
Eosinophils Absolute: 0.2 10*3/uL (ref 0.0–0.7)
Eosinophils Relative: 5 % (ref 0–5)
MCHC: 33.6 g/dL (ref 30.0–36.0)
MCV: 89.2 fL (ref 78.0–100.0)
Monocytes Absolute: 0.2 10*3/uL (ref 0.1–1.0)
Monocytes Relative: 7 % (ref 3–12)
RBC: 4.71 MIL/uL (ref 3.87–5.11)
WBC: 3.1 10*3/uL — ABNORMAL LOW (ref 4.0–10.5)

## 2011-06-01 LAB — LIPID PANEL: Cholesterol: 204 mg/dL — ABNORMAL HIGH (ref 0–200)

## 2011-06-06 ENCOUNTER — Encounter: Payer: Self-pay | Admitting: Family Medicine

## 2011-06-06 ENCOUNTER — Ambulatory Visit (INDEPENDENT_AMBULATORY_CARE_PROVIDER_SITE_OTHER): Payer: Medicare Other | Admitting: Family Medicine

## 2011-06-06 ENCOUNTER — Other Ambulatory Visit (HOSPITAL_COMMUNITY)
Admission: RE | Admit: 2011-06-06 | Discharge: 2011-06-06 | Disposition: A | Discharge status: Home / Self Care | Payer: Medicare Other | Source: Ambulatory Visit | Attending: Family Medicine | Admitting: Family Medicine

## 2011-06-06 VITALS — BP 130/76 | HR 96 | Resp 18 | Ht 66.0 in | Wt 148.0 lb

## 2011-06-06 DIAGNOSIS — M25561 Pain in right knee: Secondary | ICD-10-CM | POA: Insufficient documentation

## 2011-06-06 DIAGNOSIS — Z124 Encounter for screening for malignant neoplasm of cervix: Secondary | ICD-10-CM | POA: Insufficient documentation

## 2011-06-06 DIAGNOSIS — M25569 Pain in unspecified knee: Secondary | ICD-10-CM | POA: Diagnosis not present

## 2011-06-06 DIAGNOSIS — Z0000 Encounter for general adult medical examination without abnormal findings: Secondary | ICD-10-CM | POA: Diagnosis not present

## 2011-06-06 DIAGNOSIS — E559 Vitamin D deficiency, unspecified: Secondary | ICD-10-CM

## 2011-06-06 DIAGNOSIS — F329 Major depressive disorder, single episode, unspecified: Secondary | ICD-10-CM

## 2011-06-06 DIAGNOSIS — E039 Hypothyroidism, unspecified: Secondary | ICD-10-CM

## 2011-06-06 DIAGNOSIS — E785 Hyperlipidemia, unspecified: Secondary | ICD-10-CM

## 2011-06-06 LAB — POC HEMOCCULT BLD/STL (OFFICE/1-CARD/DIAGNOSTIC): Fecal Occult Blood, POC: NEGATIVE

## 2011-06-06 NOTE — Patient Instructions (Signed)
F/u in 4 months.  Fasting TSH and and vit D  In 4 months.  Your blood work is excellent,except please cut back on fried and fatty foods, your total cholesterol is slightly elevated  Keep walking in faith, call if you need help.  Pls call and let us know which ortho doc you want to see

## 2011-06-11 ENCOUNTER — Other Ambulatory Visit: Payer: Self-pay | Admitting: Family Medicine

## 2011-06-11 DIAGNOSIS — M25569 Pain in unspecified knee: Secondary | ICD-10-CM | POA: Insufficient documentation

## 2011-06-11 NOTE — Assessment & Plan Note (Signed)
Deteriorating, putting off ortho eval till spouse's health has stabilized

## 2011-06-11 NOTE — Assessment & Plan Note (Signed)
Improved and controlled on current med despite failing health of her spouse

## 2011-06-11 NOTE — Assessment & Plan Note (Signed)
Controlled, no change in medication  

## 2011-06-11 NOTE — Progress Notes (Signed)
  Subjective:    Patient ID: Rose [name], female    DOB: 01-08-1935, 77 y.o.   MRN: [id number]  HPI The PT is here for annual exam and re-evaluation of chronic medical conditions, medication management and review of any available recent lab and radiology data.  Preventive health is updated, specifically  Cancer screening and Immunization.   Questions or concerns regarding consultations or procedures which the PT has had in the interim are  addressed. The PT denies any adverse reactions to current medications since the last visit.  There are no new concerns.  C/o progressive left knee pin with swelling and stiffness, will call for ortho eval when able    Review of Systems See HPI Denies recent fever or chills. Denies sinus pressure, nasal congestion, ear pain or sore throat. Denies chest congestion, productive cough or wheezing. Denies chest pains, palpitations and leg swelling Denies abdominal pain, nausea, vomiting,diarrhea or constipation.   Denies dysuria, frequency, hesitancy or incontinence.  Denies headaches, seizures, numbness, or tingling. Denies uncontrolled depression, anxiety or insomnia, despite the failing health of her spouse Denies skin break down or rash.        Objective:   Physical Exam Pleasant well nourished female, alert and oriented x 3, in no cardio-pulmonary distress. Afebrile. HEENT No facial trauma or asymetry. Sinuses non tender.  EOMI, PERTL, fundoscopic exam is normal, no hemorhage or exudate.  External ears normal, tympanic membranes clear. Oropharynx moist, no exudate, good dentition. Neck: supple, no adenopathy,JVD or thyromegaly.No bruits.  Chest: Clear to ascultation bilaterally.No crackles or wheezes. Non tender to palpation  Breast: No asymetry,no masses. No nipple discharge or inversion. No axillary or supraclavicular adenopathy  Cardiovascular system; Heart sounds normal,  S1 and  S2 ,no S3.  No murmur, or thrill. Apical  beat not displaced Peripheral pulses normal.  Abdomen: Soft, non tender, no organomegaly or masses. No bruits. Bowel sounds normal. No guarding, tenderness or rebound.  Rectal:  No mass. Guaiac negative stool.  GU: External genitalia normal. No lesions. Vaginal canal normal.No discharge. Uterus absent  no adnexal masses, no adnexal tenderness.  Musculoskeletal exam: Full ROM of spine, hips , shoulders  Reduced  In left knee.  deformity ,swelling  And crepitus noted of left knee No muscle wasting or atrophy.   Neurologic: Cranial nerves 2 to 12 intact. Power, tone ,sensation and reflexes normal throughout. No disturbance in gait. No tremor.  Skin: Intact, no ulceration, erythema , scaling or rash noted. Pigmentation normal throughout  Psych; Normal mood and affect. Judgement and concentration normal        Assessment & Plan:

## 2011-06-20 ENCOUNTER — Telehealth: Payer: Self-pay | Admitting: Family Medicine

## 2011-06-20 ENCOUNTER — Encounter: Payer: Self-pay | Admitting: Family Medicine

## 2011-06-20 NOTE — Telephone Encounter (Signed)
Message left for pt to retun my call, let me know when she does pls

## 2011-06-20 NOTE — Telephone Encounter (Signed)
Spoke with pt, she will make appt to come in if nerve meds need to be changed

## 2011-07-07 ENCOUNTER — Encounter: Payer: Self-pay | Admitting: Orthopedic Surgery

## 2011-07-07 ENCOUNTER — Ambulatory Visit (INDEPENDENT_AMBULATORY_CARE_PROVIDER_SITE_OTHER): Payer: Medicare Other | Admitting: Orthopedic Surgery

## 2011-07-07 VITALS — BP 130/90 | Ht 66.0 in | Wt 148.0 lb

## 2011-07-07 DIAGNOSIS — M1710 Unilateral primary osteoarthritis, unspecified knee: Secondary | ICD-10-CM | POA: Insufficient documentation

## 2011-07-07 DIAGNOSIS — M179 Osteoarthritis of knee, unspecified: Secondary | ICD-10-CM

## 2011-07-07 MED ORDER — TRAMADOL-ACETAMINOPHEN 37.5-325 MG PO TABS: 1.0000 | ORAL_TABLET | Freq: Three times a day (TID) | ORAL | Status: AC | PRN

## 2011-07-07 NOTE — Progress Notes (Signed)
  Subjective:    Rose [name] is a 77 y.o. female who presents With pain and swelling of the LEFT knee associated with weightbearing.  The patient noted gradual onset of pain approximately 2 months ago she received one injection from a local orthopaedist and that seemed to help.  She is also on Mobic 7.5 mg daily but is having pain despite that.  When she is ambulating she is having 8/10 throbbing pain associated with locking swelling and a feeling of numbness.  It seems to be worse with ambulation.  She can do most of her activities of daily living and she is not having any giving out episodes.  Review of systems is negative except for history of coughing skin changes unsteady gait anxiety depression and seasonal ALLERGY. The following portions of the patient's history were reviewed and updated as appropriate: allergies, current medications, past family history, past medical history, past social history, past surgical history and problem list.   Review of Systems Pertinent items are noted in HPI.   Objective:    BP 130/90  Ht 5\' 6"  (1.676 m)  Wt 148 lb (67.132 kg)  BMI 23.89 kg/m2  Vital signs are stable as recorded  General appearance is normal  The patient is alert and oriented x3  The patient's mood and affect are normal  Gait assessment: normal with valgus alignment to the left knee  The cardiovascular exam reveals normal pulses and temperature without edema swelling.  The lymphatic system is negative for palpable lymph nodes  The sensory exam is normal.  There are no pathologic reflexes.  Balance is normal.  Upper extremity exam  Inspection and palpation revealed no abnormalities in the upper extremities.  Range of motion is full without contracture.  Motor exam is normal with grade 5 strength.  The joints are fully reduced without subluxation.  There is no atrophy or tremor and muscle tone is normal.  All joints are stable.   Right knee: normal and no  effusion, full active range of motion, no joint line tenderness, ligamentous structures intact.  Left knee:  positive exam findings: effusion, crepitus, lateral joint line tenderness and ROM limited to approximately 114 degrees and negative exam findings: ACL stable, PCL stable, MCL stable, LCL stable, no patellar laxity and McMurray's negative   X-ray left knee: shows DJD changes, likely chronic    Assessment:    Left Severe osteoarthritis on the left    Plan:    Natural history and expected course discussed. Questions answered. Transport planner distributed. Arthrocentesis. See procedure note. Ultracet for pain    Knee Arthrocentesis / Injection Procedure Note  Knee  Injection Procedure Note  Pre-operative Diagnosis: left knee oa  Post-operative Diagnosis: same  Indications: pain  Anesthesia: ethyl chloride   Procedure Details   Verbal consent was obtained for the procedure. Time out was completed.The joint was prepped with alcohol, followed by  Ethyl chloride spray and A 20 gauge needle was inserted into the knee via lateral approach; 4ml 1% lidocaine and 1 ml of depomedrol  was then injected into the joint . The needle was removed and the area cleansed and dressed.  Complications:  None; patient tolerated the procedure well.

## 2011-07-07 NOTE — Patient Instructions (Signed)
You have received a steroid shot. 15% of patients experience increased pain at the injection site with in the next 24 hours. This is best treated with ice and tylenol extra strength 2 tabs every 8 hours. If you are still having pain please call the office.   Total Knee Replacement In total knee replacement, the damaged knee is replaced with an artificial knee joint (prosthesis). The purpose of this surgery is to reduce pain and improve your range of motion. Regaining a near normal range of motion of your knee in the first 3 to 6 weeks after surgery is critical. Generally, these artificial joints last a minimum of 10 years. By that time, about 1 in 10 patients will need another surgery to repair the loose prosthesis. LET YOUR CAREGIVER KNOW ABOUT:    Allergies to food or medicine.     Medicines taken, including vitamins, herbs, eyedrops, over-the-counter medicines, and creams.     Use of steroids (by mouth or creams).     Previous problems with anesthetics or numbing medicines.     History of bleeding problems or blood clots.     Previous surgery.     Other health problems, including diabetes and kidney problems.     Possibility of pregnancy, if this applies.  RISKS AND COMPLICATIONS    Knee pain.     Loss of range of motion of the knee (stiffness) or instability.     Loosening of the prosthesis.     Infection.  BEFORE THE PROCEDURE    If you smoke, quit.     You may need a replacement or addition of blood (transfusion) during this procedure. You may want to donate your own blood for storage several weeks before the procedure. This way, your own blood can be stored and given to you if needed. Talk to your surgeon about this option.     Do not eat or drink anything for as long as directed by your caregiver before the procedure.     You may bathe and brush your teeth before the procedure. Do not swallow the water when brushing your teeth.     Scrub the area to be operated on for  5 minutes in the morning before the procedure. Use special soap if you are directed to do so by your caregiver.     Take your regular medicines with a small sip of water unless otherwise instructed. Your caregiver will let you know if there are medicines that need to be stopped and for how long.     You should be present 60 minutes before your procedure or as directed by your caregiver.  PROCEDURE   Before surgery, an intravenous (IV) access for giving fluids will be started. You will be given medicines and/or gas to make you sleep (anesthetic). You may be given medicines in your back with a needle to make you numb from the waist down. Your surgeon will take out any damaged cartilage and bone. He or she will then put in new metal, plastic, or ceramic joint surfaces to restore alignment and function to your knee. AFTER THE PROCEDURE   You will be taken to the recovery area where a nurse will watch and check your progress. You may have a long, narrow tube (catheter) in your bladder after surgery. The catheter helps you empty your bladder (pass your urine). You may have drainage tubes coming out from under the dressing. These tubes attach to a device that removes blood or fluids that gather  after surgery. Once you are awake, stable, and taking fluids well, you will be returned to your room. You will receive physical therapy as prescribed by your caregiver. If you do not have help at home, you may need to go to an extended care facility for a few weeks. If you are sent home with a continuous passive motion (CPM) machine, use it as instructed. Document Released: 08/08/2000 Document Revised: 01/12/2011 Document Reviewed: 03/04/2009 [facility] Patient Information 2012 [place], [place].

## 2011-07-26 ENCOUNTER — Other Ambulatory Visit: Payer: Self-pay | Admitting: Family Medicine

## 2011-07-26 DIAGNOSIS — Z139 Encounter for screening, unspecified: Secondary | ICD-10-CM

## 2011-08-10 ENCOUNTER — Ambulatory Visit (INDEPENDENT_AMBULATORY_CARE_PROVIDER_SITE_OTHER): Payer: Medicare Other | Admitting: Internal Medicine

## 2011-08-10 ENCOUNTER — Encounter (INDEPENDENT_AMBULATORY_CARE_PROVIDER_SITE_OTHER): Payer: Self-pay | Admitting: Internal Medicine

## 2011-08-10 VITALS — BP 122/52 | HR 64 | Temp 97.8°F | Ht 65.0 in | Wt 147.7 lb

## 2011-08-10 DIAGNOSIS — K625 Hemorrhage of anus and rectum: Secondary | ICD-10-CM

## 2011-08-10 NOTE — Progress Notes (Addendum)
Subjective:     Patient ID: Rose [name], female   DOB: 1935/04/15, 77 y.o.   MRN: [id number]  [name] is a 77 yr old female presenting today with c/o that Monday she had a BM and noticed red blood in her stool. She also saw blood on the tissue. The next day she saw a small amount of blood. She had some lower back pain. Stools are brown in color. No melena. Appetite is good. No weight loss. No abdominal pain. No rectal pain.  Her last colonoscopy was in 2008 for average risk screening ago in [place]. Biopsy revealed tubular adenoma.   Review of Systems Current Outpatient Prescriptions  Medication Sig Dispense Refill  . albuterol (PROVENTIL HFA) 108 (90 BASE) MCG/ACT inhaler Inhale 2 puffs into the lungs as directed. Take 2 puffs by mouth three times daily for 1 week, then every 6 to 8  Hours as needed for excessive cough and wheezing       . aspirin (BAYER ASPIRIN EC LOW DOSE) 81 MG EC tablet Take 81 mg by mouth daily.        . Calcium 500-125 MG-UNIT TABS Take by mouth 3 (three) times daily.        . citalopram (CELEXA) 10 MG tablet Take 1 tablet (10 mg total) by mouth daily.  30 tablet  3  . ergocalciferol (VITAMIN D2) 50000 UNITS capsule Take 50,000 Units by mouth once a week.        . levothyroxine (SYNTHROID, LEVOTHROID) 25 MCG tablet TAKE 1 TABLET BY MOUTH EVERY DAY  30 tablet  3  . Multiple Vitamin (MULTIVITAMIN PO) Take by mouth daily.        . pantoprazole (PROTONIX) 40 MG tablet TAKE 1 TABLET EVERY MORNING  30 tablet  5  . traMADol-acetaminophen (ULTRACET) 37.5-325 MG per tablet Take 1 tablet by mouth every 6 (six) hours as needed.       Past Medical History  Diagnosis Date  . Dyslipidemia   . Chronic anxiety   . Hypothyroidism   . PAC (premature atrial contraction)     Arrythmia   Past Surgical History  Procedure Date  . Appendectomy   . Cholecystectomy   . Partial hysterectomy    History   Social History  . Marital Status: Married    Spouse Name: N/A    Number of  Children: N/A  . Years of Education: N/A   Occupational History  . Not on file.   Social History Main Topics  . Smoking status: Never Smoker   . Smokeless tobacco: Not on file  . Alcohol Use: No  . Drug Use: No  . Sexually Active: Not on file   Other Topics Concern  . Not on file   Social History Narrative  . No narrative on file   Family Status  Relation Status Death Age  . Mother Deceased     Stomach  cancer  . Father Deceased     MI at age 77  . Sister Alive     good health  . Sister Alive     good health  . Sister Deceased     unknown  . Brother Deceased     One deceased from etoh abuse, two  deceased from natural causes.  One in good health  . Brother Other   . Brother Other   . Brother Other    Allergies  Allergen Reactions  . Penicillins        Objective:  Physical Exam Filed Vitals:   08/10/11 1408  Height: 5\' 5"  (1.651 m)  Weight: 147 lb 11.2 oz (66.996 kg)    Alert and oriented. Skin warm and dry. Oral mucosa is moist.   . Sclera anicteric, conjunctivae is pink. Thyroid not enlarged. No cervical lymphadenopathy. Lungs clear. Heart regular rate and rhythm.  Abdomen is soft. Bowel sounds are positive. No hepatomegaly. No abdominal masses felt. No tenderness.  No edema to lower extremities. Patient is alert and oriented.  Stool brown and guaiac negative.     Assessment:   Rectal bleeding, probably hemorrhoidal. Hx of tubular adenoma. I discussed this case with Dr. Karilyn Cota    Plan:   Colonoscopy with Dr. Karilyn Cota.

## 2011-08-10 NOTE — Patient Instructions (Addendum)
Hemocult card in one month. If positive : sigmoidoscopy. I discussed with Dr. Karilyn Cota.Discharge Instructions Browse by Alphabet A B C D E F G H I J K L M N O P Q R S T U V W X Y Z  Browse by Category All Documents Allergy and Immunology Anesthesiology [facility] Bioterrorism Cardiology Critical Care Dentistry Dermatology Diabetes Dietary Easy-to-Read Emergency Medicine Endocrinology ENT Family Medicine Forms Gastroenterology Geriatrics Infectious Disease Internal Medicine Labs and Tests Neonatology Nephrology Neurology Obstetrics and Gynecology Oncology Ophthalmology Orthopedics Pediatrics Pharmacology Physical Medicine and Rehabilitation Podiatry Preventative Medicine Procedures Psychiatry Pulmonary Medicine Radiology Rheumatology Surgery Urology Drug Information Sheets All Drug Information Sheets  Browse by Alphabet A B C D E F G H I J K L M N O P Q R S T U V W X Y Z

## 2011-08-16 ENCOUNTER — Telehealth (INDEPENDENT_AMBULATORY_CARE_PROVIDER_SITE_OTHER): Payer: Self-pay | Admitting: Internal Medicine

## 2011-08-16 ENCOUNTER — Telehealth (INDEPENDENT_AMBULATORY_CARE_PROVIDER_SITE_OTHER): Payer: Self-pay | Admitting: *Deleted

## 2011-08-16 ENCOUNTER — Other Ambulatory Visit (INDEPENDENT_AMBULATORY_CARE_PROVIDER_SITE_OTHER): Payer: Self-pay | Admitting: *Deleted

## 2011-08-16 DIAGNOSIS — K625 Hemorrhage of anus and rectum: Secondary | ICD-10-CM

## 2011-08-16 DIAGNOSIS — Z86010 Personal history of colonic polyps: Secondary | ICD-10-CM

## 2011-08-16 MED ORDER — PEG-KCL-NACL-NASULF-NA ASC-C 100 G PO SOLR: 1.0000 | Freq: Once | ORAL | Status: DC

## 2011-08-16 NOTE — Telephone Encounter (Signed)
Spoke with patient. Last colonoscopy 2008. Biopsy revealed tubular adenoma.   Ann, Please schedule a colonoscopy

## 2011-08-16 NOTE — Telephone Encounter (Signed)
Patient needs movi prep 

## 2011-08-16 NOTE — Telephone Encounter (Signed)
TCS sch'd 09/08/11, patient aware

## 2011-09-01 ENCOUNTER — Ambulatory Visit (HOSPITAL_COMMUNITY)
Admission: RE | Admit: 2011-09-01 | Discharge: 2011-09-01 | Disposition: A | Discharge status: Home / Self Care | Payer: Medicare Other | Source: Ambulatory Visit | Attending: Family Medicine | Admitting: Family Medicine

## 2011-09-01 DIAGNOSIS — Z1231 Encounter for screening mammogram for malignant neoplasm of breast: Secondary | ICD-10-CM | POA: Insufficient documentation

## 2011-09-01 DIAGNOSIS — Z139 Encounter for screening, unspecified: Secondary | ICD-10-CM

## 2011-09-07 MED ORDER — SODIUM CHLORIDE 0.45 % IV SOLN
Freq: Once | INTRAVENOUS | Status: AC
  Administered 2011-09-08: 14:00:00 via INTRAVENOUS

## 2011-09-08 ENCOUNTER — Ambulatory Visit (HOSPITAL_COMMUNITY)
Admission: RE | Admit: 2011-09-08 | Discharge: 2011-09-08 | Disposition: A | Discharge status: Home Health | Payer: Medicare Other | Source: Ambulatory Visit | Attending: Internal Medicine | Admitting: Internal Medicine

## 2011-09-08 ENCOUNTER — Encounter (HOSPITAL_COMMUNITY)
Admission: RE | Disposition: A | Discharge status: Home Health | Payer: Self-pay | Source: Ambulatory Visit | Attending: Internal Medicine

## 2011-09-08 ENCOUNTER — Encounter (HOSPITAL_COMMUNITY): Payer: Self-pay

## 2011-09-08 DIAGNOSIS — K5730 Diverticulosis of large intestine without perforation or abscess without bleeding: Secondary | ICD-10-CM | POA: Diagnosis not present

## 2011-09-08 DIAGNOSIS — K921 Melena: Secondary | ICD-10-CM | POA: Diagnosis not present

## 2011-09-08 DIAGNOSIS — E785 Hyperlipidemia, unspecified: Secondary | ICD-10-CM | POA: Diagnosis not present

## 2011-09-08 DIAGNOSIS — Z860100 Personal history of colon polyps, unspecified: Secondary | ICD-10-CM | POA: Insufficient documentation

## 2011-09-08 DIAGNOSIS — K644 Residual hemorrhoidal skin tags: Secondary | ICD-10-CM | POA: Diagnosis not present

## 2011-09-08 DIAGNOSIS — K625 Hemorrhage of anus and rectum: Secondary | ICD-10-CM

## 2011-09-08 DIAGNOSIS — K922 Gastrointestinal hemorrhage, unspecified: Secondary | ICD-10-CM

## 2011-09-08 DIAGNOSIS — D126 Benign neoplasm of colon, unspecified: Secondary | ICD-10-CM | POA: Diagnosis not present

## 2011-09-08 HISTORY — PX: COLONOSCOPY: SHX5424

## 2011-09-08 SURGERY — COLONOSCOPY
Anesthesia: Moderate Sedation

## 2011-09-08 MED ORDER — MEPERIDINE HCL 50 MG/ML IJ SOLN
INTRAMUSCULAR | Status: DC | PRN
  Administered 2011-09-08: 25 mg via INTRAVENOUS

## 2011-09-08 MED ORDER — MIDAZOLAM HCL 5 MG/5ML IJ SOLN
INTRAMUSCULAR | Status: AC
  Filled 2011-09-08: qty 10

## 2011-09-08 MED ORDER — MIDAZOLAM HCL 5 MG/5ML IJ SOLN
INTRAMUSCULAR | Status: DC | PRN
  Administered 2011-09-08 (×2): 2 mg via INTRAVENOUS
  Administered 2011-09-08: 1 mg via INTRAVENOUS

## 2011-09-08 MED ORDER — STERILE WATER FOR IRRIGATION IR SOLN
Status: DC | PRN
  Administered 2011-09-08: 15:00:00

## 2011-09-08 MED ORDER — MEPERIDINE HCL 50 MG/ML IJ SOLN
INTRAMUSCULAR | Status: AC
  Filled 2011-09-08: qty 1

## 2011-09-08 NOTE — Discharge Instructions (Signed)
Resume usual medications and high fiber diet. No driving for 24 hours. Physician to contact you with biopsy results.  Colonoscopy Care After Read the instructions outlined below and refer to this sheet in the next few weeks. These discharge instructions provide you with general information on caring for yourself after you leave the hospital. Your doctor may also give you specific instructions. While your treatment has been planned according to the most current medical practices available, unavoidable complications occasionally occur. If you have any problems or questions after discharge, call your doctor. HOME CARE INSTRUCTIONS ACTIVITY:  You may resume your regular activity, but move at a slower pace for the next 24 hours.   Take frequent rest periods for the next 24 hours.   Walking will help get rid of the air and reduce the bloated feeling in your belly (abdomen).   No driving for 24 hours (because of the medicine (anesthesia) used during the test).   You may shower.   Do not sign any important legal documents or operate any machinery for 24 hours (because of the anesthesia used during the test).  NUTRITION:  Drink plenty of fluids.   You may resume your normal diet as instructed by your doctor.   Begin with a light meal and progress to your normal diet. Heavy or fried foods are harder to digest and may make you feel sick to your stomach (nauseated).   Avoid alcoholic beverages for 24 hours or as instructed.  MEDICATIONS:  You may resume your normal medications unless your doctor tells you otherwise.  WHAT TO EXPECT TODAY:  Some feelings of bloating in the abdomen.   Passage of more gas than usual.   Spotting of blood in your stool or on the toilet paper.  IF YOU HAD POLYPS REMOVED DURING THE COLONOSCOPY:  No aspirin products for 7 days or as instructed.   No alcohol for 7 days or as instructed.   Eat a soft diet for the next 24 hours.  FINDING OUT THE RESULTS OF  YOUR TEST Not all test results are available during your visit. If your test results are not back during the visit, make an appointment with your caregiver to find out the results. Do not assume everything is normal if you have not heard from your caregiver or the medical facility. It is important for you to follow up on all of your test results.  SEEK IMMEDIATE MEDICAL CARE IF:  You have more than a spotting of blood in your stool.   Your belly is swollen (abdominal distention).   You are nauseated or vomiting.   You have a fever.   You have abdominal pain or discomfort that is severe or gets worse throughout the day.  Document Released: 12/15/2003 Document Revised: 04/21/2011 Document Reviewed: 12/13/2007 [facility] Patient Information 2012 [place], [place].  Colon Polyps A polyp is extra tissue that grows inside your body. Colon polyps grow in the large intestine. The large intestine, also called the colon, is part of your digestive system. It is a long, hollow tube at the end of your digestive tract where your body makes and stores stool. Most polyps are not dangerous. They are benign. This means they are not cancerous. But over time, some types of polyps can turn into cancer. Polyps that are smaller than a pea are usually not harmful. But larger polyps could someday become or may already be cancerous. To be safe, doctors remove all polyps and test them.  WHO GETS POLYPS? Anyone can  get polyps, but certain people are more likely than others. You may have a greater chance of getting polyps if:  You are over 50.   You have had polyps before.   Someone in your family has had polyps.   Someone in your family has had cancer of the large intestine.   Find out if someone in your family has had polyps. You may also be more likely to get polyps if you:   Eat a lot of fatty foods.   Smoke.   Drink alcohol.   Do not exercise.   Eat too much.  SYMPTOMS  Most small polyps do not cause  symptoms. People often do not know they have one until their caregiver finds it during a regular checkup or while testing them for something else. Some people do have symptoms like these:  Bleeding from the anus. You might notice blood on your underwear or on toilet paper after you have had a bowel movement.   Constipation or diarrhea that lasts more than a week.   Blood in the stool. Blood can make stool look black or it can show up as red streaks in the stool.  If you have any of these symptoms, see your caregiver. HOW DOES THE DOCTOR TEST FOR POLYPS? The doctor can use four tests to check for polyps:  Digital rectal exam. The caregiver wears gloves and checks your rectum (the last part of the large intestine) to see if it feels normal. This test would find polyps only in the rectum. Your caregiver may need to do one of the other tests listed below to find polyps higher up in the intestine.   Barium enema. The caregiver puts a liquid called barium into your rectum before taking x-rays of your large intestine. Barium makes your intestine look white in the pictures. Polyps are dark, so they are easy to see.   Sigmoidoscopy. With this test, the caregiver can see inside your large intestine. A thin flexible tube is placed into your rectum. The device is called a sigmoidoscope, which has a light and a tiny video camera in it. The caregiver uses the sigmoidoscope to look at the last third of your large intestine.   Colonoscopy. This test is like sigmoidoscopy, but the caregiver looks at all of the large intestine. It usually requires sedation. This is the most common method for finding and removing polyps.  TREATMENT   The caregiver will remove the polyp during sigmoidoscopy or colonoscopy. The polyp is then tested for cancer.   If you have had polyps, your caregiver may want you to get tested regularly in the future.  PREVENTION  There is not one sure way to prevent polyps. You might be able to  lower your risk of getting them if you:  Eat more fruits and vegetables and less fatty food.   Do not smoke.   Avoid alcohol.   Exercise every day.   Lose weight if you are overweight.   Eating more calcium and folate can also lower your risk of getting polyps. Some foods that are rich in calcium are milk, cheese, and broccoli. Some foods that are rich in folate are chickpeas, kidney beans, and spinach.   Aspirin might help prevent polyps. Studies are under way.  Document Released: 01/27/2004 Document Revised: 04/21/2011 Document Reviewed: 07/04/2007 [facility] Patient Information 2012 [place], [place].

## 2011-09-08 NOTE — Op Note (Signed)
COLONOSCOPY PROCEDURE REPORT  PATIENT:  Rose [name]  MR#:  [id number] Birthdate:  1935-03-31, 77 y.o., female Endoscopist:  Dr. Malissa Hippo, MD Referred By:  Dr. Milus Mallick. Lodema Hong, MD Procedure Date: 09/08/2011  Procedure:   Colonoscopy  Indications:  Patient is a 77 year old African female who recently experienced an episode of rectal bleeding. She also has history of colonic adenoma and her last colonoscopy was 5 years ago. She is undergoing diagnostic/surveillance colonoscopy.   Informed Consent:  The procedure and risks were reviewed with the patient and informed consent was obtained.  Medications:  Demerol 50 mg IV Versed 5 mg IV  Description of procedure:  After a digital rectal exam was performed, that colonoscope was advanced from the anus through the rectum and colon to the area of the cecum, ileocecal valve and appendiceal orifice. The cecum was deeply intubated. These structures were well-seen and photographed for the record. From the level of the cecum and ileocecal valve, the scope was slowly and cautiously withdrawn. The mucosal surfaces were carefully surveyed utilizing scope tip to flexion to facilitate fold flattening as needed. The scope was pulled down into the rectum where a thorough exam including retroflexion was performed.  Findings:   Preparation excellent. Moderate number of diverticula and sigmoid colon. Two small polyps ablated via cold biopsy and submitted in one container(cecum and  ascending colon) Moderate size hemorrhoids below the dentate line.  Therapeutic/Diagnostic Maneuvers Performed:  See above  Complications:  None  Cecal Withdrawal Time:  12 minutes  Impression:  Examination performed to cecum. Moderate sigmoid colon diverticulosis. Two small polyps ablated via cold biopsy and submitted together(cecum and ascending colon)  Recommendations:  Standard instructions given. I will be contacting patient with biopsy  results.  Yolette Hastings U  09/08/2011 3:19 PM  CC: Dr. Syliva Overman, MD, MD & Dr. Bonnetta Barry ref. provider found

## 2011-09-08 NOTE — H&P (Signed)
Rose [name] is an 77 y.o. female.   Chief Complaint: Patient is here for colonoscopy. HPI: This 77 year old African female a single episode of hematochezia a few weeks ago. She denies diarrhea constipation abdominal pain. Patient's last colonoscopy was 5 years ago with removal of single tubular adenoma. Family history is negative for colorectal carcinoma the  Past Medical History  Diagnosis Date  . Dyslipidemia   . Chronic anxiety   . Hypothyroidism   . PAC (premature atrial contraction)     Arrythmia    Past Surgical History  Procedure Date  . Appendectomy   . Cholecystectomy   . Partial hysterectomy     Family History  Problem Relation Age of Onset  . Cancer Mother     colon   . Cancer Father     colon   . Hypertension Sister   . Hypertension Sister   . Cancer Brother     colon   . Cancer Brother    Social History:  reports that she has never smoked. She does not have any smokeless tobacco history on file. She reports that she does not drink alcohol or use illicit drugs.  Allergies:  Allergies  Allergen Reactions  . Penicillins     Medications Prior to Admission  Medication Sig Dispense Refill  . albuterol (PROVENTIL HFA) 108 (90 BASE) MCG/ACT inhaler Inhale 2 puffs into the lungs as directed. Take 2 puffs by mouth three times daily for 1 week, then every 6 to 8  Hours as needed for excessive cough and wheezing       . aspirin (BAYER ASPIRIN EC LOW DOSE) 81 MG EC tablet Take 81 mg by mouth daily.        . Calcium 500-125 MG-UNIT TABS Take by mouth 3 (three) times daily.        . citalopram (CELEXA) 10 MG tablet Take 1 tablet (10 mg total) by mouth daily.  30 tablet  3  . ergocalciferol (VITAMIN D2) 50000 UNITS capsule Take 50,000 Units by mouth once a week.        . levothyroxine (SYNTHROID, LEVOTHROID) 25 MCG tablet TAKE 1 TABLET BY MOUTH EVERY DAY  30 tablet  3  . Multiple Vitamin (MULTIVITAMIN PO) Take by mouth daily.        . pantoprazole (PROTONIX) 40 MG  tablet TAKE 1 TABLET EVERY MORNING  30 tablet  5  . peg 3350 powder (MOVIPREP) SOLR Take 1 kit (100 g total) by mouth once.  1 kit  0  . traMADol-acetaminophen (ULTRACET) 37.5-325 MG per tablet Take 1 tablet by mouth every 6 (six) hours as needed.        No results found for this or any previous visit (from the past 48 hour(s)). No results found.  Review of Systems  Constitutional: Negative for weight loss.  Gastrointestinal: Negative for abdominal pain, diarrhea, constipation, blood in stool and melena.    Blood pressure 132/65, pulse 73, temperature 97.4 F (36.3 C), temperature source Oral, resp. rate 20, height 5' 5.5" (1.664 m), weight 142 lb (64.411 kg), SpO2 99.00%. Physical Exam  Constitutional: She appears well-developed and well-nourished.  HENT:  Mouth/Throat: Oropharynx is clear and moist.  Eyes: Conjunctivae are normal. No scleral icterus.  Neck: No thyromegaly present.  Cardiovascular: Normal rate, regular rhythm and normal heart sounds.   No murmur heard. Respiratory: Effort normal and breath sounds normal.  GI: Soft. She exhibits no distension and no mass. There is no tenderness.  Musculoskeletal: She exhibits  no edema.  Lymphadenopathy:    She has no cervical adenopathy.  Neurological: She is alert.  Skin: Skin is warm and dry.     Assessment/Plan Hematochezia. History of colonic adenoma. Colonoscopy.  Kaylob Wallen U 09/08/2011, 2:33 PM

## 2011-09-13 ENCOUNTER — Other Ambulatory Visit (INDEPENDENT_AMBULATORY_CARE_PROVIDER_SITE_OTHER): Payer: Self-pay | Admitting: Internal Medicine

## 2011-09-13 ENCOUNTER — Encounter (HOSPITAL_COMMUNITY): Payer: Self-pay | Admitting: Internal Medicine

## 2011-09-14 ENCOUNTER — Encounter (INDEPENDENT_AMBULATORY_CARE_PROVIDER_SITE_OTHER): Payer: Self-pay | Admitting: *Deleted

## 2011-10-04 ENCOUNTER — Ambulatory Visit: Payer: Medicare Other | Admitting: Family Medicine

## 2011-10-10 ENCOUNTER — Other Ambulatory Visit: Payer: Self-pay | Admitting: Family Medicine

## 2011-10-12 ENCOUNTER — Ambulatory Visit: Payer: Medicare Other | Admitting: Family Medicine

## 2011-10-13 ENCOUNTER — Ambulatory Visit (INDEPENDENT_AMBULATORY_CARE_PROVIDER_SITE_OTHER): Payer: Medicare Other | Admitting: Family Medicine

## 2011-10-13 ENCOUNTER — Encounter: Payer: Self-pay | Admitting: Family Medicine

## 2011-10-13 VITALS — BP 158/92 | HR 98 | Resp 18 | Ht 66.0 in | Wt 145.1 lb

## 2011-10-13 DIAGNOSIS — K219 Gastro-esophageal reflux disease without esophagitis: Secondary | ICD-10-CM

## 2011-10-13 DIAGNOSIS — F411 Generalized anxiety disorder: Secondary | ICD-10-CM

## 2011-10-13 DIAGNOSIS — J309 Allergic rhinitis, unspecified: Secondary | ICD-10-CM | POA: Diagnosis not present

## 2011-10-13 DIAGNOSIS — E039 Hypothyroidism, unspecified: Secondary | ICD-10-CM | POA: Diagnosis not present

## 2011-10-13 DIAGNOSIS — I10 Essential (primary) hypertension: Secondary | ICD-10-CM

## 2011-10-13 DIAGNOSIS — R030 Elevated blood-pressure reading, without diagnosis of hypertension: Secondary | ICD-10-CM | POA: Insufficient documentation

## 2011-10-13 MED ORDER — BENAZEPRIL-HYDROCHLOROTHIAZIDE 10-12.5 MG PO TABS: 1.0000 | ORAL_TABLET | Freq: Every day | ORAL | Status: DC

## 2011-10-13 NOTE — Progress Notes (Signed)
  Subjective:    Patient ID: Rose [name], female    DOB: Nov 22, 1934, 77 y.o.   MRN: [id number]  HPI The PT is here for being sent in from a community nurse with a dx of hypertension, states a community nurse told her she needed to start medication TODAY! She is also here for  re-evaluation of chronic medical conditions, medication management and review of any available recent lab and radiology data.  Preventive health is updated, specifically  Cancer screening and Immunization.   Questions or concerns regarding consultations or procedures which the PT has had in the interim are  addressed. The PT denies any adverse reactions to current medications since the last visit.  She has recently been under increased stress , recent death of her sister in law, and spouse has sever dementia    Review of Systems See HPI Denies recent fever or chills. Denies sinus pressure, nasal congestion, ear pain or sore throat. Denies chest congestion, productive cough or wheezing. Denies chest pains, palpitations and leg swelling Denies abdominal pain, nausea, vomiting,diarrhea or constipation.   Denies dysuria, frequency, hesitancy or incontinence. Denies joint pain, swelling and limitation in mobility. Denies headaches, seizures, numbness, or tingling. Denies skin break down or rash.        Objective:   Physical Exam Patient alert and oriented and in no cardiopulmonary distress.  HEENT: No facial asymmetry, EOMI, no sinus tenderness,  oropharynx pink and moist.  Neck supple no adenopathy.  Chest: Clear to auscultation bilaterally.  CVS: S1, S2 no murmurs, no S3.  ABD: Soft non tender. Bowel sounds normal.  Ext: No edema  MS: Adequate ROM spine, shoulders, hips and knees.  Skin: Intact, no ulcerations or rash noted.  Psych: Good eye contact, normal affect. Memory intact, anxious but not depressed appearing.  CNS: CN 2-12 intact, power, tone and sensation normal throughout.          Assessment & Plan:

## 2011-10-13 NOTE — Patient Instructions (Signed)
F/u in 3 to 3.5 weeks  New medication for high blood pressure, take at the same time once daily, please start today  Fasting chem 7 and TSH 2 to 3 days before next visit.  Please try and get some rest, this will help you to feel better.  Sorry about your recent loss

## 2011-10-16 NOTE — Assessment & Plan Note (Signed)
Controlled, uses albuterol intermittently

## 2011-10-16 NOTE — Assessment & Plan Note (Addendum)
New diagnosis, pt to start medication and have close follow up, some of this is stress related

## 2011-10-16 NOTE — Assessment & Plan Note (Signed)
Slightly increased due to recent family stress , death of her sister in law and dementia in spouse

## 2011-10-16 NOTE — Assessment & Plan Note (Signed)
Controlled, no change in medication Also followed by GI 

## 2011-10-16 NOTE — Assessment & Plan Note (Signed)
Controlled, no change in medication  

## 2011-10-17 ENCOUNTER — Ambulatory Visit (INDEPENDENT_AMBULATORY_CARE_PROVIDER_SITE_OTHER): Payer: Medicare Other | Admitting: Family Medicine

## 2011-10-17 ENCOUNTER — Encounter: Payer: Self-pay | Admitting: Family Medicine

## 2011-10-17 VITALS — BP 100/60 | HR 70 | Resp 16 | Ht 66.0 in | Wt 141.1 lb

## 2011-10-17 DIAGNOSIS — E039 Hypothyroidism, unspecified: Secondary | ICD-10-CM | POA: Diagnosis not present

## 2011-10-17 DIAGNOSIS — R030 Elevated blood-pressure reading, without diagnosis of hypertension: Secondary | ICD-10-CM | POA: Diagnosis not present

## 2011-10-17 NOTE — Progress Notes (Signed)
  Subjective:    Patient ID: Rose [name], female    DOB: [birth year], 77 y.o.   MRN: [id number]  HPI C/o new headaches since starting medication for blood pressure 4 days ago. Has been compliant and taken med up to 1 day ago. Denies lightheadedness, chest pain or cough. No significant fatigue, niece , a nurse has taken it at home and got a low reading   Review of Systems See HPI Denies recent fever or chills. Denies sinus pressure, nasal congestion, ear pain or sore throat. Denies chest congestion, productive cough or wheezing. Denies chest pains, palpitations and leg swelling  Denies skin break down or rash.        Objective:   Physical Exam Patient alert and oriented and in no cardiopulmonary distress.  HEENT: No facial asymmetry, EOMI, no sinus tenderness,  oropharynx pink and moist.  Neck supple no adenopathy.  Chest: Clear to auscultation bilaterally.  CVS: S1, S2 no murmurs, no S3.  ABD: Soft non tender. Bowel sounds normal.  Ext: No edema  MCNS: CN 2-12 intact, power, tone and sensation normal throughout.        Assessment & Plan:

## 2011-10-17 NOTE — Patient Instructions (Addendum)
F/u as before.  STOP new medication recently started for elevated blood pressure at last visit.  I do nOT believe that you need this, your blood pressure today is low, oK to check blood pressure once weekly at home  Please follow low salt diet rich in fresh or frozen fruit and vegetables also start back regular walking as able 20 to 30 minutes is good

## 2011-10-18 NOTE — Assessment & Plan Note (Signed)
Controlled, no change in medication  

## 2011-10-18 NOTE — Assessment & Plan Note (Signed)
Pt in with low BP and symptomatic with headache. Stop medication, lifestyle change only. Record review shows only 3 previous episodes of elevated blood pressure in the past approx 3 years

## 2011-10-20 ENCOUNTER — Ambulatory Visit: Payer: Medicare Other | Admitting: Family Medicine

## 2011-11-08 ENCOUNTER — Encounter: Payer: Self-pay | Admitting: Family Medicine

## 2011-11-08 ENCOUNTER — Ambulatory Visit (INDEPENDENT_AMBULATORY_CARE_PROVIDER_SITE_OTHER): Payer: Medicare Other | Admitting: Family Medicine

## 2011-11-08 VITALS — BP 124/80 | HR 70 | Resp 18 | Ht 66.0 in | Wt 145.1 lb

## 2011-11-08 DIAGNOSIS — E039 Hypothyroidism, unspecified: Secondary | ICD-10-CM

## 2011-11-08 DIAGNOSIS — E785 Hyperlipidemia, unspecified: Secondary | ICD-10-CM | POA: Diagnosis not present

## 2011-11-08 DIAGNOSIS — F411 Generalized anxiety disorder: Secondary | ICD-10-CM

## 2011-11-08 DIAGNOSIS — F3289 Other specified depressive episodes: Secondary | ICD-10-CM

## 2011-11-08 DIAGNOSIS — E559 Vitamin D deficiency, unspecified: Secondary | ICD-10-CM

## 2011-11-08 DIAGNOSIS — F419 Anxiety disorder, unspecified: Secondary | ICD-10-CM

## 2011-11-08 DIAGNOSIS — F329 Major depressive disorder, single episode, unspecified: Secondary | ICD-10-CM

## 2011-11-08 DIAGNOSIS — R030 Elevated blood-pressure reading, without diagnosis of hypertension: Secondary | ICD-10-CM | POA: Diagnosis not present

## 2011-11-08 MED ORDER — CITALOPRAM HYDROBROMIDE 10 MG PO TABS: 10.0000 mg | ORAL_TABLET | Freq: Every day | ORAL | Status: DC

## 2011-11-08 NOTE — Patient Instructions (Addendum)
F/u with rectal exam Jan 22 or after.Please call if you need me before  Call for your flu vaccine in September please  Stop protonix please.  Please make sure that you are taking calcium supplement with D daily  TSH asap  Fasting cBc, chem 7, lipid and TSH in January before visit  I hope you keep well!

## 2011-11-09 DIAGNOSIS — E039 Hypothyroidism, unspecified: Secondary | ICD-10-CM | POA: Diagnosis not present

## 2011-11-12 ENCOUNTER — Other Ambulatory Visit: Payer: Self-pay | Admitting: Family Medicine

## 2011-11-14 ENCOUNTER — Telehealth: Payer: Self-pay | Admitting: Family Medicine

## 2011-11-14 NOTE — Telephone Encounter (Signed)
Called and left voicemail with results.

## 2011-11-21 ENCOUNTER — Encounter: Payer: Self-pay | Admitting: Family Medicine

## 2011-11-21 ENCOUNTER — Ambulatory Visit (INDEPENDENT_AMBULATORY_CARE_PROVIDER_SITE_OTHER): Payer: Medicare Other | Admitting: Family Medicine

## 2011-11-21 ENCOUNTER — Ambulatory Visit (HOSPITAL_COMMUNITY)
Admission: RE | Admit: 2011-11-21 | Discharge: 2011-11-21 | Disposition: A | Discharge status: Home / Self Care | Payer: Medicare Other | Source: Ambulatory Visit | Attending: Family Medicine | Admitting: Family Medicine

## 2011-11-21 VITALS — BP 110/70 | HR 69 | Resp 16 | Ht 66.0 in | Wt 145.1 lb

## 2011-11-21 DIAGNOSIS — M542 Cervicalgia: Secondary | ICD-10-CM

## 2011-11-21 DIAGNOSIS — M62838 Other muscle spasm: Secondary | ICD-10-CM

## 2011-11-21 DIAGNOSIS — F329 Major depressive disorder, single episode, unspecified: Secondary | ICD-10-CM

## 2011-11-21 DIAGNOSIS — M5030 Other cervical disc degeneration, unspecified cervical region: Secondary | ICD-10-CM | POA: Diagnosis not present

## 2011-11-21 DIAGNOSIS — J309 Allergic rhinitis, unspecified: Secondary | ICD-10-CM

## 2011-11-21 DIAGNOSIS — F411 Generalized anxiety disorder: Secondary | ICD-10-CM | POA: Diagnosis not present

## 2011-11-21 DIAGNOSIS — E039 Hypothyroidism, unspecified: Secondary | ICD-10-CM

## 2011-11-21 MED ORDER — DIAZEPAM 2 MG PO TABS: ORAL_TABLET | ORAL | Status: DC

## 2011-11-21 MED ORDER — LORATADINE 10 MG PO TABS: 10.0000 mg | ORAL_TABLET | Freq: Every day | ORAL | Status: DC

## 2011-11-21 MED ORDER — MELOXICAM 7.5 MG PO TABS: 7.5000 mg | ORAL_TABLET | Freq: Every day | ORAL | Status: DC

## 2011-11-21 NOTE — Patient Instructions (Addendum)
F/u in 5.5 weeks.  You are  Referred for physical therapy for neck pain and also medication is sent in. You need an x ray of your neck You will get tylenol 2 tablets in office for headache, use generic claritin for allergies, is sent to your pharmacy   Please call if your headache is no better in the next week  Blood pressure and heart rate are excellent  Happy 77th, and continued blessings!

## 2011-12-05 ENCOUNTER — Ambulatory Visit (HOSPITAL_COMMUNITY)
Admission: RE | Admit: 2011-12-05 | Discharge: 2011-12-05 | Disposition: A | Discharge status: Home / Self Care | Payer: Medicare Other | Source: Ambulatory Visit | Attending: Family Medicine | Admitting: Family Medicine

## 2011-12-05 DIAGNOSIS — M6281 Muscle weakness (generalized): Secondary | ICD-10-CM | POA: Insufficient documentation

## 2011-12-05 DIAGNOSIS — IMO0001 Reserved for inherently not codable concepts without codable children: Secondary | ICD-10-CM | POA: Insufficient documentation

## 2011-12-05 DIAGNOSIS — M2560 Stiffness of unspecified joint, not elsewhere classified: Secondary | ICD-10-CM | POA: Insufficient documentation

## 2011-12-05 DIAGNOSIS — M62838 Other muscle spasm: Secondary | ICD-10-CM | POA: Insufficient documentation

## 2011-12-05 DIAGNOSIS — M542 Cervicalgia: Secondary | ICD-10-CM | POA: Diagnosis not present

## 2011-12-05 NOTE — Evaluation (Signed)
Physical Therapy Evaluation  Patient Details  Name: Rose [name] MRN: [id number] Date of Birth: [birth year]  Today's Date: 12/05/2011 Time: 1015-1102 PT Time Calculation (min): 47 min  Visit#: 1  of 8   Re-eval: 01/04/12 Assessment Diagnosis: cervical pain.  Authorization: medicare  Authorization Time Period:    Authorization Visit#: 1  of 10    Past Medical History:  Past Medical History  Diagnosis Date  . Dyslipidemia   . Chronic anxiety   . Hypothyroidism   . PAC (premature atrial contraction)     Arrythmia   Past Surgical History:  Past Surgical History  Procedure Date  . Appendectomy   . Cholecystectomy   . Partial hysterectomy   . Colonoscopy 09/08/2011    Procedure: COLONOSCOPY;  Surgeon: Malissa Hippo, MD;  Location: [facility];  Service: Endoscopy;  Laterality: N/A;  2:10    Subjective Symptoms/Limitations Symptoms: Rose [name] states that she has been having neck pain for about a month and a half ago.  She was having headaches but those have gone since she has been taking some medication.  The patient states the pain in her neck is on both sides and seems to be worse when she turns her head.  She states that the pain goes down into her left shoulder.  She has come to therapy today to get rid of her pain and return her to her prior quality of life. How long can you sit comfortably?: wnl How long can you stand comfortably?: wnl How long can you walk comfortably?: wnl Pain Assessment Currently in Pain?: Yes Pain Score:   5 (worst pain in the past week 6/10; best 5/10) Pain Location: Neck Pain Orientation: Right;Left Pain Type: Chronic pain Pain Onset: More than a month ago Pain Frequency: Constant Pain Relieving Factors: The pt takes medication to get relief of her pain. Effect of Pain on Daily Activities: no affect on pain level  Precautions/Restrictions     Prior Functioning  Home Living Lives With: Spouse Type of Home: House Additional  Comments: housecleaning does not affect pain. Prior Function Vocation: Retired Leisure: Hobbies-yes (Comment) Comments: read, walk, [facility] center to exercise  Cognition/Observation Cognition Overall Cognitive Status: Appears within functional limits for tasks assessed Observation/Other Assessments Observations: Pt is guarded posture cervical area. Other Assessments: head held slight SB to right  Sensation/Coordination/Flexibility/Functional Tests Functional Tests Functional Tests: neck disablility 7/50  Assessment Cervical AROM Cervical Flexion: WNL with reps causing no change Cervical Extension: WNL with reps causing increase pain Cervical - Right Side Bend: decreased 20% with increased pain Cervical - Left Side Bend: decreased 50% with muscular pain Cervical - Right Rotation: decreased 50% Cervical - Left Rotation: decreased 70% Cervical Strength Cervical Extension: 3/5 Cervical - Right Side Bend: 3/5 Cervical - Left Side Bend: 3-/5 Palpation Palpation: multiple marked mm spasms noted   Exercise/Treatments   Seated Exercises Cervical Isometrics: Extension;Right lateral flexion;Left lateral flexion;5 reps Cervical Rotation: 5 reps Lateral Flexion: 5 reps Shoulder Shrugs: 5 reps;Limitations Shoulder Shrugs Limitations: up/back/relax Other Seated Exercise: scapular retraction x 10     Manual Therapy Manual Therapy: Massage Massage: to cervical area, multiple marked mm spasms noted both Right and Left.  Pt will need jt mobs as well  Physical Therapy Assessment and Plan PT Assessment and Plan Clinical Impression Statement: Pt with decreased strength, decreased ROM, pain and significant mm spasms who will benefit from skilled PT to obtain normal ROM, strength, and no pain to improve pt safety in driving and  improve her quality of life. Pt will benefit from skilled therapeutic intervention in order to improve on the following deficits: Decreased range of motion;Decreased  strength;Increased fascial restricitons;Increased muscle spasms;Pain Rehab Potential: Good PT Frequency: Min 2X/week PT Duration: 4 weeks PT Treatment/Interventions: Manual techniques;Modalities;Therapeutic exercise;Patient/family education PT Plan: Pt to be seen twice a week for educatiojn in posture, stress reduction, exercise, manual and modalites if needed to retrun pt to prior level.  Next treatment begin, UBE backward, W-back, c-retraction, and chest stretch;  3rd treatment begin t-band ex..    Goals Home Exercise Program Pt will Perform Home Exercise Program: Independently PT Short Term Goals Time to Complete Short Term Goals: 2 weeks PT Short Term Goal 1: ROM 75 % of normal to allow safer driving PT Short Term Goal 2: Pain level no higher than a 3 PT Long Term Goals Time to Complete Long Term Goals: 4 weeks PT Long Term Goal 1: I in advance HEP PT Long Term Goal 2: ROM and strrength wnl to allow safe driving Long Term Goal 3: only mild mm spasm palpatable Long Term Goal 4: pt to state no increased neck pain  Problem List Patient Active Problem List  Diagnosis  . HYPOTHYROIDISM  . VITAMIN D DEFICIENCY  . DYSLIPIDEMIA  . ANXIETY, CHRONIC  . ALLERGIC RHINITIS  . NECK PAIN, CHRONIC  . BACK PAIN  . FATIGUE  . MURMUR  . GERD (gastroesophageal reflux disease)  . Depression  . Knee pain  . OA (osteoarthritis) of knee  . Rectal bleeding  . Borderline systolic HTN  . Stiffness of joints, not elsewhere classified, multiple sites  . Muscle spasms of head or neck    PT - End of Session Activity Tolerance: Patient tolerated treatment well General Behavior During Session: [facility] for tasks performed PT Plan of Care PT Home Exercise Plan: given PT Patient Instructions: given Consulted and Agree with Plan of Care: Patient  GP Functional Assessment Tool Used: Neck disability plus cliical judgement,ie ROM and spasm Functional Limitation: Changing and maintaining body  position Changing and Maintaining Body Position Current Status (Z6109): At least 60 percent but less than 80 percent impaired, limited or restricted Changing and Maintaining Body Position Goal Status (U0454): At least 1 percent but less than 20 percent impaired, limited or restricted  Karon Heckendorn,CINDY 12/05/2011, 12:35 PM  Physician Documentation Your signature is required to indicate approval of the treatment plan as stated above.  Please sign and either send electronically or make a copy of this report for your files and return this physician signed original.   Please mark one 1.__approve of plan  2. ___approve of plan with the following conditions.   ______________________________                                                          _____________________ Physician Signature  Date  

## 2011-12-07 ENCOUNTER — Ambulatory Visit (HOSPITAL_COMMUNITY)
Admission: RE | Admit: 2011-12-07 | Discharge: 2011-12-07 | Disposition: A | Discharge status: Home / Self Care | Payer: Medicare Other | Source: Ambulatory Visit | Attending: Family Medicine | Admitting: Family Medicine

## 2011-12-07 DIAGNOSIS — H3560 Retinal hemorrhage, unspecified eye: Secondary | ICD-10-CM | POA: Diagnosis not present

## 2011-12-07 DIAGNOSIS — H524 Presbyopia: Secondary | ICD-10-CM | POA: Diagnosis not present

## 2011-12-07 DIAGNOSIS — M2560 Stiffness of unspecified joint, not elsewhere classified: Secondary | ICD-10-CM

## 2011-12-07 DIAGNOSIS — H359 Unspecified retinal disorder: Secondary | ICD-10-CM | POA: Diagnosis not present

## 2011-12-07 DIAGNOSIS — H2589 Other age-related cataract: Secondary | ICD-10-CM | POA: Diagnosis not present

## 2011-12-07 DIAGNOSIS — M62838 Other muscle spasm: Secondary | ICD-10-CM

## 2011-12-07 NOTE — Assessment & Plan Note (Signed)
contiue supplement check TSH

## 2011-12-07 NOTE — Assessment & Plan Note (Signed)
Controlled, no change in medication  

## 2011-12-07 NOTE — Assessment & Plan Note (Signed)
Continue weekly supplements and review in 2014

## 2011-12-07 NOTE — Progress Notes (Signed)
Physical Therapy Treatment Patient Details  Name: LYNNDA WIERSMA MRN: [id number] Date of Birth: [birth year]  Today's Date: 12/07/2011 Time: 0805-0846 PT Time Calculation (min): 41 min  Visit#: 2  of 8   Re-eval: 01/04/12    Authorization: medicare  Authorization Time Period:    Authorization Visit#: 2  of 10    Subjective: Symptoms/Limitations Symptoms: The massage helped so much last time. Pain Assessment Currently in Pain?: Yes Pain Score: 0-No pain Pain Location: Neck    Exercise/Treatments   Machines for Strengthening UBE (Upper Arm Bike): .0x 3'   Standing Exercises Neck Retraction: 10 reps Seated Exercises Cervical Rotation: 5 reps Lateral Flexion: 5 reps W Back: 10 reps Shoulder Shrugs: 5 reps Other Seated Exercise: scapular 10  Manual Therapy Manual Therapy: Massage Massage: B cervical and mid trap as well as scalene mm.  Gentle Grade I mobs used as pt is very tight.  Physical Therapy Assessment and Plan PT Assessment and Plan Clinical Impression Statement: Pt ROM has improved; mm spasm moderate no longer marked.  Pt will need continued PT to regain FROM;  add chest srretch and T-band ex for posture.    Goals    Problem List Patient Active Problem List  Diagnosis  . HYPOTHYROIDISM  . VITAMIN D DEFICIENCY  . DYSLIPIDEMIA  . ANXIETY, CHRONIC  . ALLERGIC RHINITIS  . NECK PAIN, CHRONIC  . BACK PAIN  . FATIGUE  . MURMUR  . GERD (gastroesophageal reflux disease)  . Depression  . Knee pain  . OA (osteoarthritis) of knee  . Rectal bleeding  . Borderline systolic HTN  . Stiffness of joints, not elsewhere classified, multiple sites  . Muscle spasms of head or neck    PT - End of Session Activity Tolerance: Patient tolerated treatment well General Behavior During Session: [facility] for tasks performed Cognition: [facility] for tasks performed  GP    RUSSELL,CINDY 12/07/2011, 9:10 AM

## 2011-12-07 NOTE — Assessment & Plan Note (Signed)
Normotensive at this visit 

## 2011-12-07 NOTE — Assessment & Plan Note (Signed)
Dietary management only, lipids mildly elevated when last checked

## 2011-12-07 NOTE — Progress Notes (Signed)
  Subjective:    Patient ID: Rose [name], female    DOB: 1935-03-06, 77 y.o.   MRN: [id number]  HPI The PT is here for follow up and re-evaluation of chronic medical conditions, medication management and review of any available recent lab and radiology data.  Preventive health is updated, specifically  Cancer screening and Immunization.   Questions or concerns regarding consultations or procedures which the PT has had in the interim are  addressed. The PT denies any adverse reactions to current medications since the last visit.  There are no new concerns.  There are no specific complaints       Review of Systems See HPI Denies recent fever or chills. Denies sinus pressure, nasal congestion, ear pain or sore throat. Denies chest congestion, productive cough or wheezing. Denies chest pains, palpitations and leg swelling Denies abdominal pain, nausea, vomiting,diarrhea or constipation.   Denies dysuria, frequency, hesitancy or incontinence. Denies joint pain, swelling and limitation in mobility. Denies headaches, seizures, numbness, or tingling. Denies depression, anxiety or insomnia. Denies skin break down or rash.        Objective:   Physical Exam Patient alert and oriented and in no cardiopulmonary distress.  HEENT: No facial asymmetry, EOMI, no sinus tenderness,  oropharynx pink and moist.  Neck supple no adenopathy.  Chest: Clear to auscultation bilaterally.  CVS: S1, S2 no murmurs, no S3.  ABD: Soft non tender. Bowel sounds normal.  Ext: No edema  MS: Adequate ROM spine, shoulders, hips and knees.  Skin: Intact, no ulcerations or rash noted.  Psych: Good eye contact, normal affect. Memory intact not anxious or depressed appearing.  CNS: CN 2-12 intact, power, tone and sensation normal throughout.        Assessment & Plan:

## 2011-12-11 NOTE — Assessment & Plan Note (Signed)
Controlled, no change in medication  

## 2011-12-11 NOTE — Assessment & Plan Note (Signed)
Increased symptoms, some due to anxiety around blood pressure. Normal neurologic exam, tylenol recommended and pt to call if no improvement in headache

## 2011-12-11 NOTE — Progress Notes (Signed)
  Subjective:    Patient ID: Rose [name], female    DOB: 10-Sep-1934, 77 y.o.   MRN: [id number]  HPI Pt in with main c/o 5 day h/o increase head and neck pain with stiffness in her neck muscles. She denies fever or chills, she has noted increased nasal stuffiness with clear watery drainage in the past week also. She continues to express concern about her blood pressure    Review of Systems See HPI Denies recent fever or chills. Denies chest congestion, productive cough or wheezing. Denies chest pains, palpitations and leg swelling Denies abdominal pain, nausea, vomiting,diarrhea or constipation.   Denies dysuria, frequency, hesitancy or incontinence. . Denies depression,does have slight increased  anxiety denies  insomnia. Denies skin break down or rash.        Objective:   Physical Exam Patient alert and oriented and in no cardiopulmonary distress.  HEENT: No facial asymmetry, EOMI, no sinus tenderness,  oropharynx pink and moist.  Neck decreased ROM with trapezius spasm, no adenopathy.Erythema and edema of nasal mucosa  Chest: Clear to auscultation bilaterally.  CVS: S1, S2 no murmurs, no S3.  ABD: Soft non tender. Bowel sounds normal.  Ext: No edema  MS: Adequate ROM spine, shoulders, hips and knees.  Skin: Intact, no ulcerations or rash noted.  Psych: Good eye contact, normal affect. Memory intact mildly  anxious not  depressed appearing.  CNS: CN 2-12 intact, power, tone and sensation normal throughout.        Assessment & Plan:

## 2011-12-11 NOTE — Assessment & Plan Note (Signed)
Recent increase in symptoms, pt to use claritin as needed

## 2011-12-11 NOTE — Assessment & Plan Note (Signed)
Recently displaying increased symptoms, espescialy after being told that her blood pressure was high, by a nurse in the community

## 2011-12-12 ENCOUNTER — Ambulatory Visit (HOSPITAL_COMMUNITY)
Admission: RE | Admit: 2011-12-12 | Discharge: 2011-12-12 | Disposition: A | Discharge status: Home / Self Care | Payer: Medicare Other | Source: Ambulatory Visit | Attending: Family Medicine | Admitting: Family Medicine

## 2011-12-12 NOTE — Progress Notes (Signed)
Physical Therapy Treatment Patient Details  Name: Rose [name] MRN: [id number] Date of Birth: 01-Apr-1935  Today's Date: 12/12/2011 Time: 1478-2956 PT Time Calculation (min): 45 min  Visit#: 3  of 8   Re-eval: 01/04/12 Charges: Therex x 28' Manual x 10'  Authorization: medicare   Authorization Visit#: 3  of 10    Subjective: Symptoms/Limitations Symptoms: Pt reports she has been without pain for two days.  Pain Assessment Currently in Pain?: No/denies Pain Score: 0-No pain   Exercise/Treatments Stretches Upper Trapezius Stretch: 3 reps;30 seconds Machines for Strengthening UBE (Upper Arm Bike): 4'@3 .0 Theraband Exercises Scapula Retraction: 10 reps;Blue Shoulder Extension: 10 reps;Blue Rows: 10 reps;Blue Standing Exercises Neck Retraction: 10 reps Seated Exercises Cervical Rotation: 10 reps Lateral Flexion: 10 reps W Back: 10 reps Shoulder Shrugs: 10 reps Shoulder Shrugs Limitations: up/back/relax  Manual Therapy Manual Therapy: Massage Massage: B mid and upper trap and scalene to decrease tightness x 10'  Physical Therapy Assessment and Plan PT Assessment and Plan Clinical Impression Statement: Pt requires constant multimodal cueing to correctly complete exercises. Pt presents with impaired motor control of scapular mm. Pt reports she is not having any pain but continues to present with guarding secondary to habit. Pt reports 0/10 pain at end of session. PT Plan: Continue to progress ROM and postural strength per PT POC.     Problem List Patient Active Problem List  Diagnosis  . HYPOTHYROIDISM  . VITAMIN D DEFICIENCY  . DYSLIPIDEMIA  . ANXIETY, CHRONIC  . ALLERGIC RHINITIS  . NECK PAIN, CHRONIC  . BACK PAIN  . FATIGUE  . MURMUR  . GERD (gastroesophageal reflux disease)  . Depression  . Knee pain  . OA (osteoarthritis) of knee  . Rectal bleeding  . Borderline systolic HTN  . Stiffness of joints, not elsewhere classified, multiple sites  . Muscle  spasms of head or neck    PT - End of Session Activity Tolerance: Patient tolerated treatment well General Behavior During Session: [facility] for tasks performed Cognition: [facility] for tasks performed    Seth Bake, PTA 12/12/2011, 2:01 PM

## 2011-12-15 ENCOUNTER — Ambulatory Visit (HOSPITAL_COMMUNITY): Payer: Medicare Other | Admitting: Physical Therapy

## 2011-12-19 ENCOUNTER — Ambulatory Visit (HOSPITAL_COMMUNITY)
Admission: RE | Admit: 2011-12-19 | Discharge: 2011-12-19 | Disposition: A | Discharge status: Home / Self Care | Payer: Medicare Other | Source: Ambulatory Visit | Attending: Family Medicine | Admitting: Family Medicine

## 2011-12-19 DIAGNOSIS — IMO0001 Reserved for inherently not codable concepts without codable children: Secondary | ICD-10-CM | POA: Diagnosis not present

## 2011-12-19 DIAGNOSIS — M6281 Muscle weakness (generalized): Secondary | ICD-10-CM | POA: Diagnosis not present

## 2011-12-19 DIAGNOSIS — M542 Cervicalgia: Secondary | ICD-10-CM | POA: Insufficient documentation

## 2011-12-19 NOTE — Progress Notes (Signed)
Physical Therapy Treatment Patient Details  Name: Rose [name] MRN: [id number] Date of Birth: 03-24-1935  Today's Date: 12/19/2011 Time: 4098-1191 PT Time Calculation (min): 43 min 30' therex 11' manual   Visit#: 4  of 8   Re-eval: 01/04/12    Authorization:    Authorization Time Period:    Authorization Visit#: 4  of 10    Subjective: Symptoms/Limitations Symptoms: Rose [name] reports no cervical pain or stiffness this morning.  Precautions/Restrictions     Exercise/Treatments Mobility/Balance                   Physical Therapy Assessment and Plan PT Assessment and Plan Clinical Impression Statement: Pt requires constant multimodal cueing to correctly complete exercises. Pt reports she is not having any pain but continues to present with guarding secondary to habit. Pt reports 0/10 pain at end of session.  PT Plan: Continue with PT POC    Goals    Problem List Patient Active Problem List  Diagnosis  . HYPOTHYROIDISM  . VITAMIN D DEFICIENCY  . DYSLIPIDEMIA  . ANXIETY, CHRONIC  . ALLERGIC RHINITIS  . NECK PAIN, CHRONIC  . BACK PAIN  . FATIGUE  . MURMUR  . GERD (gastroesophageal reflux disease)  . Depression  . Knee pain  . OA (osteoarthritis) of knee  . Rectal bleeding  . Borderline systolic HTN  . Stiffness of joints, not elsewhere classified, multiple sites  . Muscle spasms of head or neck    PT - End of Session Activity Tolerance: Patient tolerated treatment well General Behavior During Session: [facility] for tasks performed Cognition: [facility] for tasks performed  GP    Rose [name] 12/19/2011, 11:13 AM

## 2011-12-21 ENCOUNTER — Ambulatory Visit (HOSPITAL_COMMUNITY)
Admission: RE | Admit: 2011-12-21 | Discharge: 2011-12-21 | Disposition: A | Discharge status: Home / Self Care | Payer: Medicare Other | Source: Ambulatory Visit | Attending: Family Medicine | Admitting: Family Medicine

## 2011-12-21 DIAGNOSIS — M62838 Other muscle spasm: Secondary | ICD-10-CM

## 2011-12-21 DIAGNOSIS — M2560 Stiffness of unspecified joint, not elsewhere classified: Secondary | ICD-10-CM

## 2011-12-21 NOTE — Progress Notes (Signed)
Physical Therapy Re-evaluation  Patient Details  Name: Rose [name] MRN: [id number] Date of Birth: May 02, 1935  Today's Date: 12/21/2011 Time: 4401-0272 PT Time Calculation (min): 52 min  Visit#: 5  of 8   Re-eval: 01/04/12 Assessment Diagnosis: cervical pain.  Authorization: Medicare  Authorization Time Period:    Authorization Visit#: 5  of 10    Subjective Symptoms/Limitations Symptoms: [name] reports no cervical pain or stiffness this morning.  Pt reported compliance with HEP. Pain Assessment Currently in Pain?: No/denies  Objective:   Assessment completed by Donnamae Jude, PT Cervical AROM Cervical - Right Side Bend: WNL Cervical - Left Side Bend: Decreased 20%  (was decreased 50% with muscular pain) Cervical - Right Rotation: decreased 20% (was decreased 50%) Cervical - Left Rotation: Decreased 15% (wad decreased 70%) Cervical Strength Cervical Extension: 5/5 Cervical - Right Side Bend: 5/5 Cervical - Left Side Bend: 5/5  Exercise/Treatments Stretches Upper Trapezius Stretch: 3 reps;30 seconds Corner Stretch: 2 reps;30 seconds Machines for Strengthening UBE (Upper Arm Bike): 4'@3 .0 Theraband Exercises Scapula Retraction: 15 reps;Blue Shoulder Extension: 15 reps;Blue Rows: 15 reps;Blue Standing Exercises Neck Retraction: 10 reps Seated Exercises Cervical Rotation: 10 reps Lateral Flexion: 10 reps X to V: 5 reps W Back: 10 reps Shoulder Shrugs: 10 reps;Limitations Shoulder Shrugs Limitations: up/back/relax  Manual Therapy Manual Therapy: Massage Massage: B mid and upper trap and scalene to decrease tightness x 10'   Physical Therapy Assessment and Plan PT Assessment and Plan Clinical Impression Statement: Pt required multimodal cueing for correct form/technique with postural strengthening therex this session.  During discussion pt stated she felt ready to begin these exercises at home.  Re-eval was complete with the following findings: Mrs.  [name] has had 5 OPPT sessions over 2 1/2 weeks.  She had met 3/3 STGs and 2/4 LTGs.  Pt has increased cervical ROM and strength WNL to allow safe driving.  Pt is pain free with all movements and no palpable spasms with upper traps or scalenes just tightness noted..  Pt in agreement with discharge to HEP and stated compliance with all HEP, pt encouraged to continue the cervical ROM and shoulder rolls at home. PT Plan: D/C to HEP.    Goals Home Exercise Program PT Goal: Perform Home Exercise Program - Progress: Met PT Short Term Goals PT Short Term Goal 1 - Progress: Met PT Short Term Goal 2 - Progress: Met PT Long Term Goals PT Long Term Goal 1 - Progress: Progressing toward goal PT Long Term Goal 2 - Progress: Progressing toward goal (Strength WNL, ROM improving ) Long Term Goal 3 Progress: Met Long Term Goal 4 Progress: Met  Problem List Patient Active Problem List  Diagnosis  . HYPOTHYROIDISM  . VITAMIN D DEFICIENCY  . DYSLIPIDEMIA  . ANXIETY, CHRONIC  . ALLERGIC RHINITIS  . NECK PAIN, CHRONIC  . BACK PAIN  . FATIGUE  . MURMUR  . GERD (gastroesophageal reflux disease)  . Depression  . Knee pain  . OA (osteoarthritis) of knee  . Rectal bleeding  . Borderline systolic HTN  . Stiffness of joints, not elsewhere classified, multiple sites  . Muscle spasms of head or neck    PT - End of Session Activity Tolerance: Patient tolerated treatment well General Behavior During Session: [facility] for tasks performed Cognition: [facility] for tasks performed PT Plan of Care PT Home Exercise Plan: encouraged to continue shoulder rolls and cervical ROM exercises.  GP Functional Assessment Tool Used: Neck disability plus cliical judgement,ie ROM and spasm Functional  Limitation: Changing and maintaining body position Changing and Maintaining Body Position Goal Status [phone]): At least 1 percent but less than 20 percent impaired, limited or restricted Changing and Maintaining Body Position  Discharge Status [id number]): At least 1 percent but less than 20 percent impaired, limited or restricted Juel Burrow, PTA 12/21/2011, 11:44 AM

## 2011-12-29 ENCOUNTER — Ambulatory Visit (INDEPENDENT_AMBULATORY_CARE_PROVIDER_SITE_OTHER): Payer: Medicare Other | Admitting: Family Medicine

## 2011-12-29 ENCOUNTER — Encounter: Payer: Self-pay | Admitting: Family Medicine

## 2011-12-29 VITALS — BP 140/84 | HR 83 | Resp 16 | Ht 66.0 in | Wt 150.0 lb

## 2011-12-29 DIAGNOSIS — F411 Generalized anxiety disorder: Secondary | ICD-10-CM

## 2011-12-29 DIAGNOSIS — J309 Allergic rhinitis, unspecified: Secondary | ICD-10-CM | POA: Diagnosis not present

## 2011-12-29 DIAGNOSIS — E785 Hyperlipidemia, unspecified: Secondary | ICD-10-CM

## 2011-12-29 DIAGNOSIS — F419 Anxiety disorder, unspecified: Secondary | ICD-10-CM

## 2011-12-29 DIAGNOSIS — E039 Hypothyroidism, unspecified: Secondary | ICD-10-CM | POA: Diagnosis not present

## 2011-12-29 MED ORDER — LORATADINE 10 MG PO TABS: 10.0000 mg | ORAL_TABLET | Freq: Every day | ORAL | Status: DC

## 2011-12-29 MED ORDER — BUSPIRONE HCL 7.5 MG PO TABS: 7.5000 mg | ORAL_TABLET | Freq: Three times a day (TID) | ORAL | Status: DC

## 2011-12-29 MED ORDER — LEVOTHYROXINE SODIUM 25 MCG PO TABS: ORAL_TABLET | ORAL | Status: DC

## 2011-12-29 MED ORDER — CITALOPRAM HYDROBROMIDE 10 MG PO TABS: 10.0000 mg | ORAL_TABLET | Freq: Every day | ORAL | Status: DC

## 2011-12-29 NOTE — Assessment & Plan Note (Signed)
Continue current med dose no  change

## 2011-12-29 NOTE — Patient Instructions (Addendum)
F/u in 2 month, please call if you need me  Please continue buspar, you need the medication  Start regular physical activity

## 2011-12-29 NOTE — Progress Notes (Signed)
  Subjective:    Patient ID: Rose [name], female    DOB: 1934/12/09, 77 y.o.   MRN: [id number]  HPI The PT is here for follow up and re-evaluation of chronic medical conditions, medication management and review of any available recent lab and radiology data.  Preventive health is updated, specifically  Cancer screening and Immunization.   Questions or concerns regarding consultations or procedures which the PT has had in the interim are  addressed. The PT denies any adverse reactions to current medications since the last visit.  C/o increased stress dealing with her spouse who has significant dementia, overall she appears to be coping fairly well. States she fussed with her spouse this morning and has been  Excessively "busy " in recent times which is why her blood pressure is elevated at this visit     Review of Systems See HPI Denies recent fever or chills. Denies sinus pressure, nasal congestion, ear pain or sore throat. Denies chest congestion, productive cough or wheezing. Denies chest pains, palpitations and leg swelling Denies abdominal pain, nausea, vomiting,diarrhea or constipation.   Denies dysuria, frequency, hesitancy or incontinence. Denies joint pain, swelling and limitation in mobility. Denies headaches, seizures, numbness, or tingling. Denies depression,uncontrolled  anxiety or insomnia. Denies skin break down or rash.        Objective:   Physical Exam Patient alert and oriented and in no cardiopulmonary distress.  HEENT: No facial asymmetry, EOMI, no sinus tenderness,  oropharynx pink and moist.  Neck supple no adenopathy.  Chest: Clear to auscultation bilaterally.  CVS: S1, S2 no murmurs, no S3.  ABD: Soft non tender. Bowel sounds normal.  Ext: No edema  MS: Adequate ROM spine, shoulders, hips and knees.  Skin: Intact, no ulcerations or rash noted.  Psych: Good eye contact, normal affect. Memory intact mildly  anxious not  depressed  appearing.  CNS: CN 2-12 intact, power, tone and sensation normal throughout.        Assessment & Plan:

## 2011-12-29 NOTE — Assessment & Plan Note (Signed)
Hyperlipidemia:Low fat diet discussed and encouraged.  Controlled, no change in medication   

## 2011-12-29 NOTE — Assessment & Plan Note (Signed)
Controlled, no change in medication States she stays indoors often

## 2011-12-29 NOTE — Assessment & Plan Note (Signed)
Slightly increased at this visit, pt to continue buspar, which she had thought of discontinuing

## 2012-01-02 ENCOUNTER — Encounter: Payer: Self-pay | Admitting: Family Medicine

## 2012-01-02 ENCOUNTER — Ambulatory Visit (INDEPENDENT_AMBULATORY_CARE_PROVIDER_SITE_OTHER): Payer: Medicare Other | Admitting: Family Medicine

## 2012-01-02 VITALS — BP 146/74 | HR 74 | Resp 18 | Ht 66.0 in | Wt 145.1 lb

## 2012-01-02 DIAGNOSIS — N390 Urinary tract infection, site not specified: Secondary | ICD-10-CM

## 2012-01-02 LAB — POCT URINALYSIS DIPSTICK
Protein, UA: NEGATIVE
Urobilinogen, UA: 0.2

## 2012-01-02 MED ORDER — CEPHALEXIN 500 MG PO CAPS: 500.0000 mg | ORAL_CAPSULE | Freq: Two times a day (BID) | ORAL | Status: AC

## 2012-01-02 NOTE — Progress Notes (Signed)
  Subjective:    Patient ID: Rose [name], female    DOB: 1935/03/08, 77 y.o.   MRN: [id number]  HPI Patient presents with urinary pressure and frequency for the past 24 hours. She denies burning sensation, fever, abdominal pain. She had previous UTI similar to this a year ago. No new medications. No change in bowels  Review of Systems - per above  GEN- denies fatigue, fever, weight loss,weakness, recent illness HEENT- denies eye drainage, change in vision, nasal discharge, CVS- denies chest pain, palpitations RESP- denies SOB, cough, wheeze ABD- denies N/V, change in stools, abd pain GU- denies dysuria, hematuria, dribbling, incontinence Neuro- denies headache, dizziness, syncope, seizure activity      Objective:   Physical Exam GEN- NAD, alert and oriented x3 HEENT- PERRL, EOMI, non injected sclera, pink conjunctiva, MMM, oropharynx clear CVS- RRR, no murmur RESP-CTAB ABD-NABS,soft,TTP Suprapubic region, no rebound, no guarding, no mass EXT- No edema Pulses- Radial, DP- 2+        Assessment & Plan:    Acute cystitis- we'll treat based on exam and symptoms. Antibiotics prescribed. No red flags on exam

## 2012-01-02 NOTE — Patient Instructions (Signed)
Take the antibiotics Drink plenty of fluids Call if you do not improve Urinary Tract Infection A urinary tract infection (UTI) is often caused by a germ (bacteria). A UTI is usually helped with medicine (antibiotics) that kills germs. Take all the medicine until it is gone. Do this even if you are feeling better. You are usually better in 7 to 10 days. HOME CARE    Drink enough water and fluids to keep your pee (urine) clear or pale yellow. Drink:   Cranberry juice.   Water.   Avoid:   Caffeine.   Tea.   Bubbly (carbonated) drinks.   Alcohol.   Only take medicine as told by your doctor.   To prevent further infections:   Pee often.   After pooping (bowel movement), women should wipe from front to back. Use each tissue only once.   Pee before and after having sex (intercourse).  Ask your doctor when your test results will be ready. Make sure you follow up and get your test results.   GET HELP RIGHT AWAY IF:    There is very bad back pain or lower belly (abdominal) pain.   You get the chills.   You have a fever.   Your baby is older than 3 months with a rectal temperature of 102 F (38.9 C) or higher.   Your baby is 23 months old or younger with a rectal temperature of 100.4 F (38 C) or higher.   You feel sick to your stomach (nauseous) or throw up (vomit).   There is continued burning with peeing.   Your problems are not better in 3 days. Return sooner if you are getting worse.  MAKE SURE YOU:    Understand these instructions.   Will watch your condition.   Will get help right away if you are not doing well or get worse.  Document Released: 10/19/2007 Document Revised: 04/21/2011 Document Reviewed: 10/19/2007 [facility] Patient Information 2012 [place], [place].

## 2012-01-03 LAB — URINE CULTURE: Colony Count: 6000

## 2012-01-04 DIAGNOSIS — R300 Dysuria: Secondary | ICD-10-CM | POA: Diagnosis not present

## 2012-01-04 DIAGNOSIS — N3020 Other chronic cystitis without hematuria: Secondary | ICD-10-CM | POA: Diagnosis not present

## 2012-01-27 DIAGNOSIS — N952 Postmenopausal atrophic vaginitis: Secondary | ICD-10-CM | POA: Diagnosis not present

## 2012-01-27 DIAGNOSIS — N3020 Other chronic cystitis without hematuria: Secondary | ICD-10-CM | POA: Diagnosis not present

## 2012-02-04 ENCOUNTER — Other Ambulatory Visit: Payer: Self-pay | Admitting: Family Medicine

## 2012-02-06 DIAGNOSIS — Z23 Encounter for immunization: Secondary | ICD-10-CM | POA: Diagnosis not present

## 2012-02-15 ENCOUNTER — Other Ambulatory Visit: Payer: Self-pay | Admitting: Family Medicine

## 2012-03-06 ENCOUNTER — Ambulatory Visit: Payer: Medicare Other | Admitting: Family Medicine

## 2012-03-29 ENCOUNTER — Ambulatory Visit (INDEPENDENT_AMBULATORY_CARE_PROVIDER_SITE_OTHER): Payer: Medicare Other | Admitting: Family Medicine

## 2012-03-29 ENCOUNTER — Encounter: Payer: Self-pay | Admitting: Family Medicine

## 2012-03-29 VITALS — BP 126/78 | HR 74 | Resp 18 | Ht 66.0 in | Wt 147.1 lb

## 2012-03-29 DIAGNOSIS — E785 Hyperlipidemia, unspecified: Secondary | ICD-10-CM | POA: Diagnosis not present

## 2012-03-29 DIAGNOSIS — M1710 Unilateral primary osteoarthritis, unspecified knee: Secondary | ICD-10-CM

## 2012-03-29 DIAGNOSIS — E039 Hypothyroidism, unspecified: Secondary | ICD-10-CM

## 2012-03-29 DIAGNOSIS — J309 Allergic rhinitis, unspecified: Secondary | ICD-10-CM | POA: Diagnosis not present

## 2012-03-29 DIAGNOSIS — K219 Gastro-esophageal reflux disease without esophagitis: Secondary | ICD-10-CM

## 2012-03-29 DIAGNOSIS — E559 Vitamin D deficiency, unspecified: Secondary | ICD-10-CM

## 2012-03-29 DIAGNOSIS — F411 Generalized anxiety disorder: Secondary | ICD-10-CM

## 2012-03-29 NOTE — Progress Notes (Signed)
  Subjective:    Patient ID: Rose [name], female    DOB: 10/26/1934, 77 y.o.   MRN: [id number]  HPI The PT is here for follow up and re-evaluation of chronic medical conditions, medication management and review of any available recent lab and radiology data.  Preventive health is updated, specifically  Cancer screening and Immunization.   Questions or concerns regarding consultations or procedures which the PT has had in the interim are  addressed. The PT denies any adverse reactions to current medications since the last visit.  There are no new concerns.  There are no specific complaints       Review of Systems See HPI Denies recent fever or chills. Denies sinus pressure, nasal congestion, ear pain or sore throat. Denies chest congestion, productive cough or wheezing. Denies chest pains, palpitations and leg swelling Denies abdominal pain, nausea, vomiting,diarrhea or constipation.   Denies dysuria, frequency, hesitancy or incontinence. Denies joint pain, swelling and limitation in mobility. Denies headaches, seizures, numbness, or tingling. Denies depression, anxiety or insomnia. Denies skin break down or rash.        Objective:   Physical Exam Patient alert and oriented and in no cardiopulmonary distress.  HEENT: No facial asymmetry, EOMI, no sinus tenderness,  oropharynx pink and moist.  Neck supple no adenopathy.  Chest: Clear to auscultation bilaterally.  CVS: S1, S2 no murmurs, no S3.  ABD: Soft non tender. Bowel sounds normal.  Ext: No edema  MS: Adequate ROM spine, shoulders, hips and knees.  Skin: Intact, no ulcerations or rash noted.  Psych: Good eye contact, normal affect. Memory intact not anxious or depressed appearing.  CNS: CN 2-12 intact, power, tone and sensation normal throughout.        Assessment & Plan:   

## 2012-03-29 NOTE — Patient Instructions (Addendum)
Annual wellness end January, please call if you need me before  Fasting CBC, lipid, chem 7 , TSH January 15 or after.  Your blood pressure is excellent  Medications need to continue as before

## 2012-03-31 NOTE — Assessment & Plan Note (Addendum)
Ongoing weekly vit D rept lab next visit

## 2012-03-31 NOTE — Assessment & Plan Note (Signed)
Controlled, no change in medication, increased symptoms with season change, which is expected

## 2012-03-31 NOTE — Assessment & Plan Note (Signed)
Stable an controlled on meds

## 2012-03-31 NOTE — Assessment & Plan Note (Signed)
Stable, no falls, uses tramadol , as needed

## 2012-03-31 NOTE — Assessment & Plan Note (Signed)
Controlled, no change in medication  

## 2012-03-31 NOTE — Assessment & Plan Note (Signed)
Hyperlipidemia:Low fat diet discussed and encouraged.  Updated lab next visit 

## 2012-03-31 NOTE — Assessment & Plan Note (Signed)
Controlled, no change in medication Updated lab due

## 2012-05-02 ENCOUNTER — Other Ambulatory Visit (INDEPENDENT_AMBULATORY_CARE_PROVIDER_SITE_OTHER): Payer: Self-pay | Admitting: Internal Medicine

## 2012-05-27 ENCOUNTER — Other Ambulatory Visit: Payer: Self-pay | Admitting: Family Medicine

## 2012-06-05 DIAGNOSIS — R3915 Urgency of urination: Secondary | ICD-10-CM | POA: Diagnosis not present

## 2012-06-05 DIAGNOSIS — R350 Frequency of micturition: Secondary | ICD-10-CM | POA: Diagnosis not present

## 2012-06-11 ENCOUNTER — Telehealth: Payer: Self-pay | Admitting: Family Medicine

## 2012-06-12 ENCOUNTER — Encounter (HOSPITAL_COMMUNITY): Payer: Self-pay | Admitting: Emergency Medicine

## 2012-06-12 ENCOUNTER — Emergency Department (HOSPITAL_COMMUNITY)
Admission: EM | Admit: 2012-06-12 | Discharge: 2012-06-12 | Disposition: A | Discharge status: Home / Self Care | Payer: Medicare Other | Attending: Emergency Medicine | Admitting: Emergency Medicine

## 2012-06-12 ENCOUNTER — Emergency Department (HOSPITAL_COMMUNITY): Payer: Medicare Other

## 2012-06-12 DIAGNOSIS — W19XXXA Unspecified fall, initial encounter: Secondary | ICD-10-CM

## 2012-06-12 DIAGNOSIS — Y929 Unspecified place or not applicable: Secondary | ICD-10-CM | POA: Insufficient documentation

## 2012-06-12 DIAGNOSIS — Z8679 Personal history of other diseases of the circulatory system: Secondary | ICD-10-CM | POA: Diagnosis not present

## 2012-06-12 DIAGNOSIS — F411 Generalized anxiety disorder: Secondary | ICD-10-CM | POA: Diagnosis not present

## 2012-06-12 DIAGNOSIS — S0990XA Unspecified injury of head, initial encounter: Secondary | ICD-10-CM | POA: Insufficient documentation

## 2012-06-12 DIAGNOSIS — E039 Hypothyroidism, unspecified: Secondary | ICD-10-CM | POA: Diagnosis not present

## 2012-06-12 DIAGNOSIS — S0510XA Contusion of eyeball and orbital tissues, unspecified eye, initial encounter: Secondary | ICD-10-CM | POA: Diagnosis not present

## 2012-06-12 DIAGNOSIS — S199XXA Unspecified injury of neck, initial encounter: Secondary | ICD-10-CM | POA: Diagnosis not present

## 2012-06-12 DIAGNOSIS — R51 Headache: Secondary | ICD-10-CM | POA: Diagnosis not present

## 2012-06-12 DIAGNOSIS — S0993XA Unspecified injury of face, initial encounter: Secondary | ICD-10-CM | POA: Diagnosis not present

## 2012-06-12 DIAGNOSIS — Z79899 Other long term (current) drug therapy: Secondary | ICD-10-CM | POA: Diagnosis not present

## 2012-06-12 DIAGNOSIS — Z7982 Long term (current) use of aspirin: Secondary | ICD-10-CM | POA: Insufficient documentation

## 2012-06-12 DIAGNOSIS — Z862 Personal history of diseases of the blood and blood-forming organs and certain disorders involving the immune mechanism: Secondary | ICD-10-CM | POA: Insufficient documentation

## 2012-06-12 DIAGNOSIS — Z90710 Acquired absence of both cervix and uterus: Secondary | ICD-10-CM | POA: Insufficient documentation

## 2012-06-12 DIAGNOSIS — Z8639 Personal history of other endocrine, nutritional and metabolic disease: Secondary | ICD-10-CM | POA: Insufficient documentation

## 2012-06-12 DIAGNOSIS — M542 Cervicalgia: Secondary | ICD-10-CM | POA: Diagnosis not present

## 2012-06-12 DIAGNOSIS — S1093XA Contusion of unspecified part of neck, initial encounter: Secondary | ICD-10-CM | POA: Diagnosis not present

## 2012-06-12 DIAGNOSIS — S0512XA Contusion of eyeball and orbital tissues, left eye, initial encounter: Secondary | ICD-10-CM

## 2012-06-12 DIAGNOSIS — Z9089 Acquired absence of other organs: Secondary | ICD-10-CM | POA: Insufficient documentation

## 2012-06-12 DIAGNOSIS — Y9301 Activity, walking, marching and hiking: Secondary | ICD-10-CM | POA: Insufficient documentation

## 2012-06-12 DIAGNOSIS — W1809XA Striking against other object with subsequent fall, initial encounter: Secondary | ICD-10-CM | POA: Insufficient documentation

## 2012-06-12 NOTE — Telephone Encounter (Signed)
Patient came by office and was advised to go to ED due to head injury

## 2012-06-12 NOTE — ED Provider Notes (Signed)
History    This chart was scribed for Osvaldo Human, MD, MD by Smitty Pluck, ED Scribe. The patient was seen in room APA10/APA10 and the patient's care was started at 11:38 AM.   CSN: [id number]  Arrival date & time 06/12/12  1042      Chief Complaint  Patient presents with  . Fall    Patient is a 78 y.o. female presenting with fall. The history is provided by the patient. No language interpreter was used.  Fall Pertinent negatives include no fever, no nausea and no vomiting.   Rose [name] is a 78 y.o. female who presents to the Emergency Department complaining of fall 4 days ago. Pt reports that she was walking outside and her "knee gave out" causing her to fall on concrete hitting head. She reports having bruise below left eye, constant moderate headache and neck pain. Pt denies LOC, fever, chills, nausea, vomiting, diarrhea, weakness, cough, SOB and any other pain.    Past Medical History  Diagnosis Date  . Dyslipidemia   . Chronic anxiety   . Hypothyroidism   . PAC (premature atrial contraction)     Arrythmia    Past Surgical History  Procedure Date  . Appendectomy   . Cholecystectomy   . Partial hysterectomy   . Colonoscopy 09/08/2011    Procedure: COLONOSCOPY;  Surgeon: Malissa Hippo, MD;  Location: [facility];  Service: Endoscopy;  Laterality: N/A;  2:10  . Abdominal hysterectomy     Family History  Problem Relation Age of Onset  . Cancer Mother     colon   . Cancer Father     colon   . Hypertension Sister   . Hypertension Sister   . Cancer Brother     colon   . Cancer Brother     History  Substance Use Topics  . Smoking status: Never Smoker   . Smokeless tobacco: Not on file  . Alcohol Use: No    OB History    Grav Para Term Preterm Abortions TAB SAB Ect Mult Living                  Review of Systems  Constitutional: Negative for fever and chills.  Respiratory: Negative for cough and shortness of breath.   Gastrointestinal:  Negative for nausea, vomiting and diarrhea.  Musculoskeletal: Negative for back pain.  Neurological: Negative for syncope.  All other systems reviewed and are negative.    Allergies  Penicillins  Home Medications   Current Outpatient Rx  Name  Route  Sig  Dispense  Refill  . ALBUTEROL SULFATE HFA 108 (90 BASE) MCG/ACT IN AERS   Inhalation   Inhale 2 puffs into the lungs as directed. Take 2 puffs by mouth three times daily for 1 week, then every 6 to 8  Hours as needed for excessive cough and wheezing          . ASPIRIN 81 MG PO TBEC   Oral   Take 81 mg by mouth daily.           . BUSPIRONE HCL 7.5 MG PO TABS   Oral   Take 1 tablet (7.5 mg total) by mouth 3 (three) times daily.   90 tablet   4   . CALCIUM 500-125 MG-UNIT PO TABS   Oral   Take by mouth 3 (three) times daily.           . ERGOCALCIFEROL 50000 UNITS PO CAPS   Oral  Take 50,000 Units by mouth once a week. Takes on Wednesday         . LEVOTHYROXINE SODIUM 25 MCG PO TABS      TAKE 1 TABLET BY MOUTH EVERY DAY   30 tablet   4   . MULTIVITAMIN PO   Oral   Take by mouth daily.           . TRAMADOL-ACETAMINOPHEN 37.5-325 MG PO TABS   Oral   Take 1 tablet by mouth every 6 (six) hours as needed.           BP 136/56  Pulse 60  Temp 97.4 F (36.3 C) (Oral)  Resp 18  Ht 5\' 6"  (1.676 m)  Wt 130 lb (58.968 kg)  BMI 20.98 kg/m2  SpO2 100%  Physical Exam  Nursing note and vitals reviewed. Constitutional: She is oriented to person, place, and time. She appears well-developed and well-nourished. No distress.  HENT:  Head: Normocephalic.       Left infraorbital region has contusion No bony abnormality  Forehead has 0.5 cm scab  No deformity   Eyes: Conjunctivae normal are normal. Pupils are equal, round, and reactive to light.  Neck: Normal range of motion.       Posterior lateral neck tenderness    Cardiovascular: Normal rate, regular rhythm and normal heart sounds.     Pulmonary/Chest: Effort normal and breath sounds normal. No respiratory distress. She has no wheezes. She has no rales.  Abdominal: Soft. She exhibits no distension.  Musculoskeletal:       Left knee has full ROM  No instability of left knee No effusion of left knee Right knee nl   Neurological: She is alert and oriented to person, place, and time.  Skin: Skin is warm and dry.          Psychiatric: She has a normal mood and affect. Her behavior is normal.    ED Course  Procedures (including critical care time) DIAGNOSTIC STUDIES: Oxygen Saturation is 100% on room air, normal by my interpretation.    COORDINATION OF CARE: 11:47 AM Discussed ED treatment with pt and pt agrees.     Labs Reviewed - No data to display Ct Head Wo Contrast  06/12/2012  *RADIOLOGY REPORT*  Clinical Data:  Fall 4 days ago.  Contusion below the left eye. Moderate headache and posterior cervical pain.  CT HEAD WITHOUT CONTRAST CT MAXILLOFACIAL WITHOUT CONTRAST CT CERVICAL SPINE WITHOUT CONTRAST  Technique:  Multidetector CT imaging of the head, cervical spine, and maxillofacial structures were performed using the standard protocol without intravenous contrast. Multiplanar CT image reconstructions of the cervical spine and maxillofacial structures were also generated.  Comparison:  Cervical spine radiographs 11/21/2011.  MRI brain 05/31/2005.  CT HEAD  Findings: No acute cortical infarct, hemorrhage, mass lesion is present.  Ventricles are normal size.  No significant extra-axial fluid collection is present.  Left periorbital soft tissue swelling is present without an underlying fracture.  Minimal mucosal thickening is present in the ethmoid air cells.  The mastoid air cells are clear.  The paranasal sinuses are otherwise clear.  The osseous skull is intact.  Atherosclerotic calcifications are noted in the cavernous carotid arteries.  IMPRESSION:  1.  Normal CT appearance of the brain for age. 2.  Left periorbital  soft tissue swelling without an underlying fracture. 3.  Minimal ethmoid sinus disease.  CT MAXILLOFACIAL  Findings:  Left periorbital soft tissue swelling is most pronounced inferior to the orbit.  The underlying globes are intact.  There is no underlying fracture.  Mild mucosal thickening is present in the ethmoid air cells.  The paranasal sinuses are otherwise clear. Mastoid air cells are clear.  The mandible is intact and located. Degenerative changes are noted at the TMJ bilaterally.  IMPRESSION:  1.  Left peri orbital soft tissue swelling without an underlying fracture. 2.  Degenerative changes at the TMJ bilaterally.  CT CERVICAL SPINE  Findings:   Cervical spine is imaged from the skull base through T1- 2.  Degenerative endplate changes are present at C4-5 and C5-6 with facet hypertrophy bilaterally and left greater than right foraminal narrowing at both levels.  The disc osteophyte complex is present both levels as well.  There is straightening of the normal cervical lordosis.  The vertebral body heights and alignment are maintained.  The posterior elements are fused bilaterally at C2-3.  The posterior elements are fused on the right at C6-7.  Degenerative changes are noted in the upper thoracic facet joints.  The soft tissues of the neck are unremarkable.  Mild scarring is present at right lung apex.  The lungs are otherwise clear.  IMPRESSION:  1.  No acute fracture or traumatic subluxation. 2.  Multilevel spondylosis as described.   Original Report Authenticated By: Marin Roberts, M.D.    Ct Cervical Spine Wo Contrast  06/12/2012  *RADIOLOGY REPORT*  Clinical Data:  Fall 4 days ago.  Contusion below the left eye. Moderate headache and posterior cervical pain.  CT HEAD WITHOUT CONTRAST CT MAXILLOFACIAL WITHOUT CONTRAST CT CERVICAL SPINE WITHOUT CONTRAST  Technique:  Multidetector CT imaging of the head, cervical spine, and maxillofacial structures were performed using the standard protocol  without intravenous contrast. Multiplanar CT image reconstructions of the cervical spine and maxillofacial structures were also generated.  Comparison:  Cervical spine radiographs 11/21/2011.  MRI brain 05/31/2005.  CT HEAD  Findings: No acute cortical infarct, hemorrhage, mass lesion is present.  Ventricles are normal size.  No significant extra-axial fluid collection is present.  Left periorbital soft tissue swelling is present without an underlying fracture.  Minimal mucosal thickening is present in the ethmoid air cells.  The mastoid air cells are clear.  The paranasal sinuses are otherwise clear.  The osseous skull is intact.  Atherosclerotic calcifications are noted in the cavernous carotid arteries.  IMPRESSION:  1.  Normal CT appearance of the brain for age. 2.  Left periorbital soft tissue swelling without an underlying fracture. 3.  Minimal ethmoid sinus disease.  CT MAXILLOFACIAL  Findings:  Left periorbital soft tissue swelling is most pronounced inferior to the orbit.  The underlying globes are intact.  There is no underlying fracture.  Mild mucosal thickening is present in the ethmoid air cells.  The paranasal sinuses are otherwise clear. Mastoid air cells are clear.  The mandible is intact and located. Degenerative changes are noted at the TMJ bilaterally.  IMPRESSION:  1.  Left peri orbital soft tissue swelling without an underlying fracture. 2.  Degenerative changes at the TMJ bilaterally.  CT CERVICAL SPINE  Findings:   Cervical spine is imaged from the skull base through T1- 2.  Degenerative endplate changes are present at C4-5 and C5-6 with facet hypertrophy bilaterally and left greater than right foraminal narrowing at both levels.  The disc osteophyte complex is present both levels as well.  There is straightening of the normal cervical lordosis.  The vertebral body heights and alignment are maintained.  The posterior elements are fused  bilaterally at C2-3.  The posterior elements are fused on  the right at C6-7.  Degenerative changes are noted in the upper thoracic facet joints.  The soft tissues of the neck are unremarkable.  Mild scarring is present at right lung apex.  The lungs are otherwise clear.  IMPRESSION:  1.  No acute fracture or traumatic subluxation. 2.  Multilevel spondylosis as described.   Original Report Authenticated By: Marin Roberts, M.D.    Ct Maxillofacial Wo Cm  06/12/2012  *RADIOLOGY REPORT*  Clinical Data:  Fall 4 days ago.  Contusion below the left eye. Moderate headache and posterior cervical pain.  CT HEAD WITHOUT CONTRAST CT MAXILLOFACIAL WITHOUT CONTRAST CT CERVICAL SPINE WITHOUT CONTRAST  Technique:  Multidetector CT imaging of the head, cervical spine, and maxillofacial structures were performed using the standard protocol without intravenous contrast. Multiplanar CT image reconstructions of the cervical spine and maxillofacial structures were also generated.  Comparison:  Cervical spine radiographs 11/21/2011.  MRI brain 05/31/2005.  CT HEAD  Findings: No acute cortical infarct, hemorrhage, mass lesion is present.  Ventricles are normal size.  No significant extra-axial fluid collection is present.  Left periorbital soft tissue swelling is present without an underlying fracture.  Minimal mucosal thickening is present in the ethmoid air cells.  The mastoid air cells are clear.  The paranasal sinuses are otherwise clear.  The osseous skull is intact.  Atherosclerotic calcifications are noted in the cavernous carotid arteries.  IMPRESSION:  1.  Normal CT appearance of the brain for age. 2.  Left periorbital soft tissue swelling without an underlying fracture. 3.  Minimal ethmoid sinus disease.  CT MAXILLOFACIAL  Findings:  Left periorbital soft tissue swelling is most pronounced inferior to the orbit.  The underlying globes are intact.  There is no underlying fracture.  Mild mucosal thickening is present in the ethmoid air cells.  The paranasal sinuses are otherwise  clear. Mastoid air cells are clear.  The mandible is intact and located. Degenerative changes are noted at the TMJ bilaterally.  IMPRESSION:  1.  Left peri orbital soft tissue swelling without an underlying fracture. 2.  Degenerative changes at the TMJ bilaterally.  CT CERVICAL SPINE  Findings:   Cervical spine is imaged from the skull base through T1- 2.  Degenerative endplate changes are present at C4-5 and C5-6 with facet hypertrophy bilaterally and left greater than right foraminal narrowing at both levels.  The disc osteophyte complex is present both levels as well.  There is straightening of the normal cervical lordosis.  The vertebral body heights and alignment are maintained.  The posterior elements are fused bilaterally at C2-3.  The posterior elements are fused on the right at C6-7.  Degenerative changes are noted in the upper thoracic facet joints.  The soft tissues of the neck are unremarkable.  Mild scarring is present at right lung apex.  The lungs are otherwise clear.  IMPRESSION:  1.  No acute fracture or traumatic subluxation. 2.  Multilevel spondylosis as described.   Original Report Authenticated By: Marin Roberts, M.D.     1:15 PM X-rays showed no fracture.  Pt and husband advised of this.  Released.   1. Fall   2. Periorbital contusion of left eye    I personally performed the services described in this documentation, which was scribed in my presence. The recorded information has been reviewed and is accurate. Osvaldo Human, MD      Carleene Cooper III, MD 06/12/12 1318

## 2012-06-12 NOTE — ED Notes (Signed)
Fell on 1/24, lt knee "gave way" and fell onto concrete. Headache and neck pain, contusion below lt eye,  No LOC.  And got up on her own.

## 2012-06-13 NOTE — Telephone Encounter (Signed)
Called pt sghe feels better. States she fell because her knee gave out on her, I advised orhto re eval, she states she "does not think it necessary now" but understands why I suggested it

## 2012-06-21 ENCOUNTER — Encounter: Payer: Self-pay | Admitting: Family Medicine

## 2012-06-21 ENCOUNTER — Ambulatory Visit (INDEPENDENT_AMBULATORY_CARE_PROVIDER_SITE_OTHER): Payer: Medicare Other | Admitting: Family Medicine

## 2012-06-21 VITALS — BP 132/74 | HR 77 | Resp 18 | Ht 66.0 in | Wt 148.0 lb

## 2012-06-21 DIAGNOSIS — F411 Generalized anxiety disorder: Secondary | ICD-10-CM

## 2012-06-21 DIAGNOSIS — J309 Allergic rhinitis, unspecified: Secondary | ICD-10-CM | POA: Diagnosis not present

## 2012-06-21 DIAGNOSIS — E785 Hyperlipidemia, unspecified: Secondary | ICD-10-CM

## 2012-06-21 DIAGNOSIS — E559 Vitamin D deficiency, unspecified: Secondary | ICD-10-CM

## 2012-06-21 DIAGNOSIS — E039 Hypothyroidism, unspecified: Secondary | ICD-10-CM | POA: Diagnosis not present

## 2012-06-21 DIAGNOSIS — G4700 Insomnia, unspecified: Secondary | ICD-10-CM | POA: Insufficient documentation

## 2012-06-21 DIAGNOSIS — Z0000 Encounter for general adult medical examination without abnormal findings: Secondary | ICD-10-CM

## 2012-06-21 MED ORDER — BUSPIRONE HCL 7.5 MG PO TABS: 7.5000 mg | ORAL_TABLET | Freq: Two times a day (BID) | ORAL | Status: DC

## 2012-06-21 MED ORDER — LORATADINE 10 MG PO TABS: 10.0000 mg | ORAL_TABLET | Freq: Every day | ORAL | Status: AC

## 2012-06-21 NOTE — Progress Notes (Signed)
Subjective:    Patient ID: Rose [name], female    DOB: [birth year], 78 y.o.   MRN: [id number]  HPI Preventive Screening-Counseling & Management   Patient present here today for a Medicare annual wellness visit.   Current Problems (verified)   Medications Prior to Visit Allergies (verified)   PAST HISTORY  Family History: 4 brothers and 3 sisters, 3 living, mother died of colon ca dx at 26, father died at 4 of an MI  Social History Married x 55 years, no children. 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 No  Over the past two weeks, have you felt little interest or pleasure in doing things? No  Have you lost interest or pleasure in daily life? No  Do you often feel hopeless? No  Do you cry easily over simple problems? No   Activities of Daily Living  In your present state of health, do you have any difficulty performing the following activities?  Driving?: No Managing money?: No Feeding yourself?:No Getting from bed to chair?:No Climbing a flight of stairs?:No Preparing food and eating?:No Bathing or showering?:No Getting dressed?:No Getting to the toilet?:No Using the toilet?:No Moving around from place to place?: No  Fall Risk Assessment In the past year have you fallen or had a near fall?:yes , accidentally in a hole 2 weeks ago Are you currently taking any medications that make you dizziness?:No   Hearing Difficulties: No Do you often ask people to speak up or repeat themselves?:No Do you experience ringing or noises in your ears?:No Do you  have difficulty understanding soft or whispered voices?:No  Cognitive Testing  Alert? Yes Normal Appearance?Yes  Oriented to person? Yes Place? Yes  Time? Yes  Displays appropriate judgment?Yes  Can read the correct time from a watch face? yes Are you having problems remembering things?No  Advanced Directives have been discussed with the patient?Yes    List the Names of Other Physician/Practitioners you currently use: Dr. Brunilda Payor,  Dr Edilia Bo any recent Medical Services you may have received from other than [facility] providers in the past year (date may be approximate).   Assessment:    Annual Wellness Exam   Plan:    During the course of the visit the patient was educated and counseled about appropriate screening and preventive services including:  A healthy diet is rich in fruit, vegetables and whole grains. Poultry fish, nuts and beans are a healthy choice for protein rather then red meat. A low sodium diet and drinking 64 ounces of water daily is generally recommended. Oils and sweet should be limited. Carbohydrates especially for those who are diabetic or overweight, should be limited to 30-45 gram per meal. It is important to eat on a regular schedule, at least 3 times daily. Snacks should be primarily fruits, vegetables or nuts. It is important that you exercise regularly at least 30 minutes 5 times a week. If you develop chest pain, have severe difficulty breathing, or feel very tired, stop exercising immediately and seek medical attention  Immunization reviewed and updated. Cancer screening  reviewed and updated    Patient Instructions (the written plan) was given to the patient.  Medicare Attestation  I have personally reviewed:  The patient's medical and social history  Their use of alcohol, tobacco or illicit drugs  Their current medications and supplements  The patient's functional ability including ADLs,fall risks, home safety risks, cognitive, and hearing and  visual impairment  Diet and physical activities  Evidence for depression or mood disorders  The patient's weight, height, BMI, and visual acuity have been recorded in the chart. I have made referrals, counseling, and provided education to the patient based on review of the above and I have provided the patient with a written personalized care plan for preventive services.      Review of Systems     Objective:   Physical Exam        Assessment & Plan:

## 2012-06-21 NOTE — Patient Instructions (Addendum)
F/u in  4.5  Month, please  call if you need me before  Fasting cbc, lipid, chem 7, TSH , vit D as soon as possible   New medication for anxiety which will help  with sleep   New medication for allergy

## 2012-06-22 DIAGNOSIS — E559 Vitamin D deficiency, unspecified: Secondary | ICD-10-CM | POA: Insufficient documentation

## 2012-06-22 LAB — CBC
HCT: 42.9 % (ref 36.0–46.0)
Hemoglobin: 14.4 g/dL (ref 12.0–15.0)
MCH: 29.4 pg (ref 26.0–34.0)
MCHC: 33.6 g/dL (ref 30.0–36.0)

## 2012-06-22 LAB — BASIC METABOLIC PANEL
BUN: 7 mg/dL (ref 6–23)
Calcium: 9.7 mg/dL (ref 8.4–10.5)
Glucose, Bld: 88 mg/dL (ref 70–99)

## 2012-06-22 LAB — LIPID PANEL
Cholesterol: 214 mg/dL — ABNORMAL HIGH (ref 0–200)
Triglycerides: 85 mg/dL (ref <150)
VLDL: 17 mg/dL (ref 0–40)

## 2012-06-22 LAB — TSH: TSH: 2.72 u[IU]/mL (ref 0.350–4.500)

## 2012-06-23 DIAGNOSIS — Z0000 Encounter for general adult medical examination without abnormal findings: Secondary | ICD-10-CM | POA: Insufficient documentation

## 2012-06-23 NOTE — Assessment & Plan Note (Signed)
Increased nasal congestion intermittently in the past month, add loratidine

## 2012-06-23 NOTE — Assessment & Plan Note (Signed)
Uncontrolled and increased, was on buspar but had discontinued this inadvertently, will resume same

## 2012-06-23 NOTE — Assessment & Plan Note (Signed)
Annual wellness exam completed as documented. Pt is highly functional with no limitations in function. She does have increased stress due to an ailing spouse, but overall is handling this well to date She is encouraged to continue healthy lifestyle.

## 2012-06-27 ENCOUNTER — Other Ambulatory Visit: Payer: Self-pay

## 2012-06-27 DIAGNOSIS — E559 Vitamin D deficiency, unspecified: Secondary | ICD-10-CM

## 2012-06-27 MED ORDER — ERGOCALCIFEROL 1.25 MG (50000 UT) PO CAPS: 50000.0000 [IU] | ORAL_CAPSULE | ORAL | Status: DC

## 2012-08-02 ENCOUNTER — Other Ambulatory Visit: Payer: Self-pay | Admitting: Family Medicine

## 2012-08-04 ENCOUNTER — Other Ambulatory Visit: Payer: Self-pay | Admitting: Family Medicine

## 2012-08-22 ENCOUNTER — Other Ambulatory Visit: Payer: Self-pay | Admitting: Family Medicine

## 2012-09-03 DIAGNOSIS — R3129 Other microscopic hematuria: Secondary | ICD-10-CM | POA: Diagnosis not present

## 2012-09-03 DIAGNOSIS — R350 Frequency of micturition: Secondary | ICD-10-CM | POA: Diagnosis not present

## 2012-09-06 ENCOUNTER — Ambulatory Visit (HOSPITAL_COMMUNITY)
Admission: RE | Admit: 2012-09-06 | Discharge: 2012-09-06 | Disposition: A | Discharge status: Home / Self Care | Payer: Medicare Other | Source: Ambulatory Visit | Attending: Family Medicine | Admitting: Family Medicine

## 2012-09-06 DIAGNOSIS — Z1231 Encounter for screening mammogram for malignant neoplasm of breast: Secondary | ICD-10-CM | POA: Diagnosis not present

## 2012-09-06 DIAGNOSIS — Z139 Encounter for screening, unspecified: Secondary | ICD-10-CM

## 2012-09-13 ENCOUNTER — Other Ambulatory Visit: Payer: Self-pay | Admitting: *Deleted

## 2012-09-13 DIAGNOSIS — M1710 Unilateral primary osteoarthritis, unspecified knee: Secondary | ICD-10-CM

## 2012-09-13 MED ORDER — TRAMADOL-ACETAMINOPHEN 37.5-325 MG PO TABS: 1.0000 | ORAL_TABLET | Freq: Three times a day (TID) | ORAL | Status: DC | PRN

## 2012-11-04 ENCOUNTER — Other Ambulatory Visit (INDEPENDENT_AMBULATORY_CARE_PROVIDER_SITE_OTHER): Payer: Self-pay | Admitting: Internal Medicine

## 2012-11-05 NOTE — Telephone Encounter (Signed)
Patient needs OV.

## 2012-11-19 ENCOUNTER — Encounter: Payer: Self-pay | Admitting: Family Medicine

## 2012-11-19 ENCOUNTER — Ambulatory Visit (INDEPENDENT_AMBULATORY_CARE_PROVIDER_SITE_OTHER): Payer: Medicare Other | Admitting: Family Medicine

## 2012-11-19 VITALS — BP 130/78 | HR 65 | Resp 16 | Ht 65.0 in | Wt 153.8 lb

## 2012-11-19 DIAGNOSIS — Z1211 Encounter for screening for malignant neoplasm of colon: Secondary | ICD-10-CM

## 2012-11-19 DIAGNOSIS — F411 Generalized anxiety disorder: Secondary | ICD-10-CM

## 2012-11-19 DIAGNOSIS — K219 Gastro-esophageal reflux disease without esophagitis: Secondary | ICD-10-CM

## 2012-11-19 DIAGNOSIS — N3000 Acute cystitis without hematuria: Secondary | ICD-10-CM

## 2012-11-19 DIAGNOSIS — Z1212 Encounter for screening for malignant neoplasm of rectum: Secondary | ICD-10-CM

## 2012-11-19 DIAGNOSIS — E039 Hypothyroidism, unspecified: Secondary | ICD-10-CM

## 2012-11-19 DIAGNOSIS — E559 Vitamin D deficiency, unspecified: Secondary | ICD-10-CM

## 2012-11-19 DIAGNOSIS — E785 Hyperlipidemia, unspecified: Secondary | ICD-10-CM

## 2012-11-19 LAB — POCT URINALYSIS DIPSTICK
Bilirubin, UA: NEGATIVE
Glucose, UA: NEGATIVE
Ketones, UA: NEGATIVE
Nitrite, UA: NEGATIVE
Spec Grav, UA: 1.02
Urobilinogen, UA: 0.2
pH, UA: 7

## 2012-11-19 LAB — HEMOCCULT GUIAC POC 1CARD (OFFICE): Fecal Occult Blood, POC: NEGATIVE

## 2012-11-19 MED ORDER — PANTOPRAZOLE SODIUM 40 MG PO TBEC: 40.0000 mg | DELAYED_RELEASE_TABLET | Freq: Every day | ORAL | Status: DC

## 2012-11-19 MED ORDER — CIPROFLOXACIN HCL 500 MG PO TABS: 500.0000 mg | ORAL_TABLET | Freq: Two times a day (BID) | ORAL | Status: AC

## 2012-11-19 MED ORDER — CITALOPRAM HYDROBROMIDE 10 MG PO TABS: ORAL_TABLET | ORAL | Status: DC

## 2012-11-19 MED ORDER — LEVOTHYROXINE SODIUM 25 MCG PO TABS: ORAL_TABLET | ORAL | Status: DC

## 2012-11-19 NOTE — Patient Instructions (Addendum)
F/u in 4.5 month, call if you need me before  TSH today.  Ensure that you drink cold drinks, wear least amount of clothing so you do not fel hot, exercise regularly and spend time in meditation  Blood pressure is normal  Urine is being checked for infection due to 1 week history  Rectal exam today

## 2012-11-19 NOTE — Progress Notes (Signed)
  Subjective:    Patient ID: Rose [name], female    DOB: [birth year], 78 y.o.   MRN: [id number]  HPI The PT is here for follow up and re-evaluation of chronic medical conditions, medication management and review of any available recent lab and radiology data.  Preventive health is updated, specifically  Cancer screening and Immunization.   Questions or concerns regarding consultations or procedures which the PT has had in the interim are  addressed. The PT denies any adverse reactions to current medications since the last visit.  2 week h/o excessive sweating and "hot flashes"  C/o urinary frequency with slight burning for the past 3 days Increased and uncontrolled reflux symptoms, no nausea or vomiting      Review of Systems See HPI Denies recent fever or chills. Denies sinus pressure, nasal congestion, ear pain or sore throat. Denies chest congestion, productive cough or wheezing. Denies chest pains, palpitations and leg swelling     Denies joint pain, swelling and limitation in mobility. Denies headaches, seizures, numbness, or tingling. Denies uncontrolled  depression, anxiety or insomnia. Denies skin break down or rash.        Objective:   Physical Exam  Patient alert and oriented and in no cardiopulmonary distress.  HEENT: No facial asymmetry, EOMI, no sinus tenderness,  oropharynx pink and moist.  Neck supple no adenopathy.  Chest: Clear to auscultation bilaterally.  CVS: S1, S2 no murmurs, no S3.  ABD: Soft mild suprapubic tenderness, no renal angle tenderness. Bowel sounds normal.  Ext: No edema  MS: Adequate ROM spine, shoulders, hips and knees.  Skin: Intact, no ulcerations or rash noted.  Psych: Good eye contact, normal affect. Memory intact not anxious or depressed appearing.  CNS: CN 2-12 intact, power, tone and sensation normal throughout.       Assessment & Plan:

## 2012-11-22 LAB — URINE CULTURE: Colony Count: >=100000

## 2012-12-07 DIAGNOSIS — N3000 Acute cystitis without hematuria: Secondary | ICD-10-CM | POA: Insufficient documentation

## 2012-12-07 NOTE — Assessment & Plan Note (Signed)
Symptomatic with abnormal Ua , will treat presumptively  and f/u c/s

## 2012-12-07 NOTE — Assessment & Plan Note (Signed)
Controled on current medication

## 2012-12-07 NOTE — Assessment & Plan Note (Signed)
Weekly vit D to be continued 

## 2012-12-07 NOTE — Assessment & Plan Note (Signed)
Controlled, no change in medication  

## 2012-12-07 NOTE — Assessment & Plan Note (Signed)
Medication need to be resumed

## 2012-12-07 NOTE — Assessment & Plan Note (Signed)
Hyperlipidemia:Low fat diet discussed and encouraged.  No medication only dietary change

## 2012-12-12 ENCOUNTER — Telehealth: Payer: Self-pay | Admitting: Family Medicine

## 2012-12-12 ENCOUNTER — Other Ambulatory Visit: Payer: Self-pay | Admitting: Family Medicine

## 2012-12-12 MED ORDER — CLONAZEPAM 0.5 MG PO TABS: ORAL_TABLET | ORAL | Status: DC

## 2012-12-12 NOTE — Telephone Encounter (Signed)
Left a message on her phone, pls convey my concern, also fax in the klonopin prescribed pls, advise her to use once or twice daily as needed pls

## 2012-12-12 NOTE — Telephone Encounter (Signed)
Please advise 

## 2012-12-12 NOTE — Telephone Encounter (Signed)
Patient aware and that we are thinking of her and her husband and hope hes better soon

## 2013-01-12 ENCOUNTER — Other Ambulatory Visit: Payer: Self-pay | Admitting: Family Medicine

## 2013-02-01 DIAGNOSIS — Z23 Encounter for immunization: Secondary | ICD-10-CM | POA: Diagnosis not present

## 2013-02-18 ENCOUNTER — Telehealth: Payer: Self-pay | Admitting: Family Medicine

## 2013-02-18 ENCOUNTER — Other Ambulatory Visit: Payer: Self-pay

## 2013-02-18 MED ORDER — CLONAZEPAM 0.5 MG PO TABS: ORAL_TABLET | ORAL | Status: DC

## 2013-02-18 NOTE — Telephone Encounter (Signed)
Spoke with patient and she is requesting refill on Klonopin given in the past for anxiety.

## 2013-03-05 DIAGNOSIS — R3129 Other microscopic hematuria: Secondary | ICD-10-CM | POA: Diagnosis not present

## 2013-03-05 DIAGNOSIS — N3020 Other chronic cystitis without hematuria: Secondary | ICD-10-CM | POA: Diagnosis not present

## 2013-03-05 DIAGNOSIS — N368 Other specified disorders of urethra: Secondary | ICD-10-CM | POA: Diagnosis not present

## 2013-03-21 ENCOUNTER — Telehealth: Payer: Self-pay | Admitting: Family Medicine

## 2013-03-21 NOTE — Telephone Encounter (Signed)
Please advise 

## 2013-03-21 NOTE — Telephone Encounter (Signed)
pls send in cipro 500mg  one twice daily #6 only if symptoms persist, at  Least needs urine sent for c/s Let  Her know

## 2013-03-25 ENCOUNTER — Other Ambulatory Visit: Payer: Self-pay

## 2013-03-25 DIAGNOSIS — N3000 Acute cystitis without hematuria: Secondary | ICD-10-CM | POA: Diagnosis not present

## 2013-03-25 MED ORDER — CIPROFLOXACIN HCL 500 MG PO TABS: 500.0000 mg | ORAL_TABLET | Freq: Two times a day (BID) | ORAL | Status: DC

## 2013-03-25 NOTE — Telephone Encounter (Signed)
Patient aware.  Med sent to requested pharmacy.  Patient aware that she needs to go to lab and submit urine.  She will do this asap.

## 2013-03-27 LAB — URINE CULTURE

## 2013-03-28 ENCOUNTER — Other Ambulatory Visit: Payer: Self-pay

## 2013-03-28 MED ORDER — SULFAMETHOXAZOLE-TMP DS 800-160 MG PO TABS: 1.0000 | ORAL_TABLET | Freq: Two times a day (BID) | ORAL | Status: DC

## 2013-04-10 ENCOUNTER — Encounter: Payer: Self-pay | Admitting: Family Medicine

## 2013-04-10 ENCOUNTER — Encounter (INDEPENDENT_AMBULATORY_CARE_PROVIDER_SITE_OTHER): Payer: Self-pay

## 2013-04-10 ENCOUNTER — Ambulatory Visit (INDEPENDENT_AMBULATORY_CARE_PROVIDER_SITE_OTHER): Payer: Medicare Other | Admitting: Family Medicine

## 2013-04-10 VITALS — BP 130/82 | HR 70 | Resp 16 | Ht 65.0 in | Wt 145.0 lb

## 2013-04-10 DIAGNOSIS — F411 Generalized anxiety disorder: Secondary | ICD-10-CM

## 2013-04-10 DIAGNOSIS — G4700 Insomnia, unspecified: Secondary | ICD-10-CM

## 2013-04-10 DIAGNOSIS — E785 Hyperlipidemia, unspecified: Secondary | ICD-10-CM

## 2013-04-10 DIAGNOSIS — E559 Vitamin D deficiency, unspecified: Secondary | ICD-10-CM | POA: Diagnosis not present

## 2013-04-10 DIAGNOSIS — K219 Gastro-esophageal reflux disease without esophagitis: Secondary | ICD-10-CM

## 2013-04-10 DIAGNOSIS — E039 Hypothyroidism, unspecified: Secondary | ICD-10-CM | POA: Diagnosis not present

## 2013-04-10 DIAGNOSIS — J309 Allergic rhinitis, unspecified: Secondary | ICD-10-CM

## 2013-04-10 LAB — TSH: TSH: 2.581 u[IU]/mL (ref 0.350–4.500)

## 2013-04-10 NOTE — Patient Instructions (Signed)
Pelvic and breast in 4 month, call if you need me before please  Labs today TSH and vit D  When able, you need a bone density scan  All the very best, i hope that you continue to keep as well as you are doing

## 2013-04-10 NOTE — Progress Notes (Signed)
  Subjective:    Patient ID: Rose [name], female    DOB: 10-22-1934, 78 y.o.   MRN: [id number]  HPI The PT is here for follow up and re-evaluation of chronic medical conditions, medication management and review of any available recent lab and radiology data.  Preventive health is updated, specifically  Cancer screening and Immunization.   Questions or concerns regarding consultations or procedures which the PT has had in the interim are  addressed. The PT denies any adverse reactions to current medications since the last visit.  She has had significant stress with deterioration in her spouse's health since last vist, he was actually hospitalized for 2 months reportedly, he is back home, she has professional as well as help fom her family for him, and is accepting of the situation Was successfully treated for a UTI recently      Review of Systems See HPI Denies recent fever or chills. Denies sinus pressure, nasal congestion, ear pain or sore throat. Denies chest congestion, productive cough or wheezing. Denies chest pains, palpitations and leg swelling Denies abdominal pain, nausea, vomiting,diarrhea or constipation.   Denies dysuria, frequency, hesitancy or incontinence. Denies joint pain, swelling and limitation in mobility. Denies headaches, seizures, numbness, or tingling. Denies depression,uncontrolled  anxiety or insomnia. Denies skin break down or rash.         Objective:   Physical Exam  Patient alert and oriented and in no cardiopulmonary distress.  HEENT: No facial asymmetry, EOMI, no sinus tenderness,  oropharynx pink and moist.  Neck supple no adenopathy.  Chest: Clear to auscultation bilaterally.  CVS: S1, S2 no murmurs, no S3.  ABD: Soft non tender. Bowel sounds normal.  Ext: No edema  MS: Adequate ROM spine, shoulders, hips and knees.  Skin: Intact, no ulcerations or rash noted.  Psych: Good eye contact, normal affect. Memory intact not anxious or  depressed appearing.  CNS: CN 2-12 intact, power, tone and sensation normal throughout.       Assessment & Plan:

## 2013-04-11 LAB — VITAMIN D 25 HYDROXY (VIT D DEFICIENCY, FRACTURES): Vit D, 25-Hydroxy: 23 ng/mL — ABNORMAL LOW (ref 30–89)

## 2013-04-12 NOTE — Assessment & Plan Note (Signed)
Updated lab today , med dose remains the same

## 2013-04-12 NOTE — Assessment & Plan Note (Signed)
rept lab today, pt needs to commit to weekly vit D. Dexa due but unable to commit at this time due to situation with spouse's health

## 2013-04-12 NOTE — Assessment & Plan Note (Signed)
Hyperlipidemia:Low fat diet discussed and encouraged.  Updated lab next year

## 2013-04-12 NOTE — Assessment & Plan Note (Signed)
Sleep hygiene reviewed, pt doing well at  This time. Only disturbance is having to change colostomy bag for her spouse several times at night

## 2013-04-12 NOTE — Assessment & Plan Note (Signed)
Controlled, no change in medication  

## 2013-04-12 NOTE — Assessment & Plan Note (Signed)
Doing very well continue current med

## 2013-04-25 ENCOUNTER — Other Ambulatory Visit: Payer: Self-pay

## 2013-04-25 DIAGNOSIS — E559 Vitamin D deficiency, unspecified: Secondary | ICD-10-CM

## 2013-04-25 MED ORDER — ERGOCALCIFEROL 1.25 MG (50000 UT) PO CAPS: 50000.0000 [IU] | ORAL_CAPSULE | ORAL | Status: DC

## 2013-04-27 ENCOUNTER — Telehealth: Payer: Self-pay | Admitting: Gastroenterology

## 2013-04-27 NOTE — Telephone Encounter (Signed)
TRANSFERRED GI CARE TO DR [facility]

## 2013-05-22 ENCOUNTER — Other Ambulatory Visit: Payer: Self-pay | Admitting: Family Medicine

## 2013-05-31 ENCOUNTER — Encounter (INDEPENDENT_AMBULATORY_CARE_PROVIDER_SITE_OTHER): Payer: Self-pay

## 2013-05-31 ENCOUNTER — Encounter: Payer: Self-pay | Admitting: Family Medicine

## 2013-05-31 ENCOUNTER — Ambulatory Visit (INDEPENDENT_AMBULATORY_CARE_PROVIDER_SITE_OTHER): Payer: Medicare Other | Admitting: Family Medicine

## 2013-05-31 VITALS — BP 118/80 | HR 90 | Temp 97.8°F | Resp 16 | Wt 143.8 lb

## 2013-05-31 DIAGNOSIS — F411 Generalized anxiety disorder: Secondary | ICD-10-CM | POA: Diagnosis not present

## 2013-05-31 DIAGNOSIS — J209 Acute bronchitis, unspecified: Secondary | ICD-10-CM | POA: Insufficient documentation

## 2013-05-31 DIAGNOSIS — E039 Hypothyroidism, unspecified: Secondary | ICD-10-CM | POA: Diagnosis not present

## 2013-05-31 MED ORDER — BENZONATATE 100 MG PO CAPS: 100.0000 mg | ORAL_CAPSULE | Freq: Two times a day (BID) | ORAL | Status: DC | PRN

## 2013-05-31 MED ORDER — IPRATROPIUM BROMIDE 0.02 % IN SOLN
0.5000 mg | Freq: Once | RESPIRATORY_TRACT | Status: AC
  Administered 2013-05-31: 0.5 mg via RESPIRATORY_TRACT

## 2013-05-31 MED ORDER — SULFAMETHOXAZOLE-TMP DS 800-160 MG PO TABS: 1.0000 | ORAL_TABLET | Freq: Two times a day (BID) | ORAL | Status: DC

## 2013-05-31 MED ORDER — PREDNISONE 5 MG PO TABS: 5.0000 mg | ORAL_TABLET | Freq: Two times a day (BID) | ORAL | Status: AC

## 2013-05-31 MED ORDER — ALBUTEROL SULFATE (2.5 MG/3ML) 0.083% IN NEBU
2.5000 mg | INHALATION_SOLUTION | Freq: Once | RESPIRATORY_TRACT | Status: AC
  Administered 2013-05-31: 2.5 mg via RESPIRATORY_TRACT

## 2013-05-31 MED ORDER — METHYLPREDNISOLONE ACETATE 80 MG/ML IJ SUSP
80.0000 mg | Freq: Once | INTRAMUSCULAR | Status: AC
  Administered 2013-05-31: 80 mg via INTRAMUSCULAR

## 2013-05-31 NOTE — Patient Instructions (Signed)
F/u as before  You are treated for acute bronchitis, neb treatment in office today and depo medrol 80mg  IM in the office

## 2013-06-01 ENCOUNTER — Other Ambulatory Visit: Payer: Self-pay | Admitting: Family Medicine

## 2013-06-01 NOTE — Assessment & Plan Note (Signed)
Controlled, no change in medication  

## 2013-06-01 NOTE — Assessment & Plan Note (Signed)
Controlled, no change in medication Pt doing extremely well  Esp inlight of spouse's deteriorating health

## 2013-06-01 NOTE — Assessment & Plan Note (Signed)
Acute bacterial bronchitis, neb treeatment , depo medrol and then oral decongestants, steroid and antibbioitc

## 2013-06-01 NOTE — Progress Notes (Signed)
   Subjective:    Patient ID: Rose [name], female    DOB: 06/15/1934, 79 y.o.   MRN: [id number]  HPI 5 day h/o cough, chest congestion and wheeze with intermittent chills, no documented fever. Has been under increased stress as spouse hospitalized in [place], electively, to close his colostomy, but she still has increased travel and responsibility which is taking its toll, though overall doing really well Denies sinus pressure or drainage , ear pain or sore throat   Review of Systems See HPI Denies chest pains, palpitations and leg swelling Denies abdominal pain, nausea, vomiting,diarrhea or constipation.   Denies dysuria, frequency, hesitancy or incontinence. Denies uncontrolled  joint pain, swelling and limitation in mobility. Denies headaches, seizures, numbness, or tingling. Denies depression, uncontrolled anxiety or insomnia. Denies skin break down or rash.        Objective:   Physical Exam  Patient alert and oriented and in no cardiopulmonary distress.  HEENT: No facial asymmetry, EOMI, no sinus tenderness,  oropharynx pink and moist.  Neck supple no adenopathy.  Chest: Decreased though adequate air entry, scattered wheezes, no crackles  CVS: S1, S2 no murmurs, no S3.  ABD: Soft non tender. Bowel sounds normal.  Ext: No edema  MS: Adequate ROM spine, shoulders, hips and knees.  Skin: Intact, .  Psych: Good eye contact, normal affect. Memory intact not anxious or depressed appearing.  CNS: CN 2-12 intact, power,  normal throughout.       Assessment & Plan:

## 2013-06-19 ENCOUNTER — Other Ambulatory Visit: Payer: Self-pay | Admitting: Family Medicine

## 2013-07-06 ENCOUNTER — Other Ambulatory Visit: Payer: Self-pay | Admitting: Family Medicine

## 2013-07-07 ENCOUNTER — Other Ambulatory Visit: Payer: Self-pay | Admitting: Family Medicine

## 2013-08-01 ENCOUNTER — Other Ambulatory Visit: Payer: Self-pay | Admitting: Family Medicine

## 2013-08-01 DIAGNOSIS — Z1231 Encounter for screening mammogram for malignant neoplasm of breast: Secondary | ICD-10-CM

## 2013-08-01 DIAGNOSIS — Z139 Encounter for screening, unspecified: Secondary | ICD-10-CM

## 2013-08-03 ENCOUNTER — Other Ambulatory Visit: Payer: Self-pay | Admitting: Family Medicine

## 2013-08-07 ENCOUNTER — Encounter: Payer: Self-pay | Admitting: Family Medicine

## 2013-08-07 ENCOUNTER — Ambulatory Visit (INDEPENDENT_AMBULATORY_CARE_PROVIDER_SITE_OTHER): Payer: Medicare Other | Admitting: Family Medicine

## 2013-08-07 ENCOUNTER — Encounter (INDEPENDENT_AMBULATORY_CARE_PROVIDER_SITE_OTHER): Payer: Self-pay

## 2013-08-07 ENCOUNTER — Other Ambulatory Visit (HOSPITAL_COMMUNITY)
Admission: RE | Admit: 2013-08-07 | Discharge: 2013-08-07 | Disposition: A | Discharge status: Home / Self Care | Payer: Medicare Other | Source: Ambulatory Visit | Attending: Family Medicine | Admitting: Family Medicine

## 2013-08-07 VITALS — BP 130/78 | HR 65 | Resp 16 | Wt 144.1 lb

## 2013-08-07 DIAGNOSIS — Z13820 Encounter for screening for osteoporosis: Secondary | ICD-10-CM

## 2013-08-07 DIAGNOSIS — Z1211 Encounter for screening for malignant neoplasm of colon: Secondary | ICD-10-CM

## 2013-08-07 DIAGNOSIS — Z124 Encounter for screening for malignant neoplasm of cervix: Secondary | ICD-10-CM | POA: Insufficient documentation

## 2013-08-07 DIAGNOSIS — N3020 Other chronic cystitis without hematuria: Secondary | ICD-10-CM

## 2013-08-07 DIAGNOSIS — Z1239 Encounter for other screening for malignant neoplasm of breast: Secondary | ICD-10-CM | POA: Diagnosis not present

## 2013-08-07 DIAGNOSIS — Z0000 Encounter for general adult medical examination without abnormal findings: Secondary | ICD-10-CM | POA: Diagnosis not present

## 2013-08-07 DIAGNOSIS — E039 Hypothyroidism, unspecified: Secondary | ICD-10-CM | POA: Diagnosis not present

## 2013-08-07 DIAGNOSIS — E559 Vitamin D deficiency, unspecified: Secondary | ICD-10-CM | POA: Diagnosis not present

## 2013-08-07 DIAGNOSIS — R5381 Other malaise: Secondary | ICD-10-CM

## 2013-08-07 DIAGNOSIS — E785 Hyperlipidemia, unspecified: Secondary | ICD-10-CM | POA: Diagnosis not present

## 2013-08-07 DIAGNOSIS — R5383 Other fatigue: Secondary | ICD-10-CM

## 2013-08-07 DIAGNOSIS — K219 Gastro-esophageal reflux disease without esophagitis: Secondary | ICD-10-CM

## 2013-08-07 LAB — LIPID PANEL
Cholesterol: 187 mg/dL (ref 0–200)
HDL: 80 mg/dL (ref >39)
LDL Cholesterol: 89 mg/dL (ref 0–99)
Total CHOL/HDL Ratio: 2.3 Ratio
Triglycerides: 91 mg/dL (ref <150)
VLDL: 18 mg/dL (ref 0–40)

## 2013-08-07 LAB — CBC
HEMATOCRIT: 40.1 % (ref 36.0–46.0)
HEMOGLOBIN: 13.2 g/dL (ref 12.0–15.0)
MCH: 29.1 pg (ref 26.0–34.0)
MCHC: 32.9 g/dL (ref 30.0–36.0)
MCV: 88.3 fL (ref 78.0–100.0)
Platelets: 251 10*3/uL (ref 150–400)
RBC: 4.54 MIL/uL (ref 3.87–5.11)
RDW: 14.3 % (ref 11.5–15.5)
WBC: 4.1 10*3/uL (ref 4.0–10.5)

## 2013-08-07 LAB — COMPREHENSIVE METABOLIC PANEL
ALBUMIN: 3.8 g/dL (ref 3.5–5.2)
ALT: 11 U/L (ref 0–35)
AST: 19 U/L (ref 0–37)
Alkaline Phosphatase: 115 U/L (ref 39–117)
BUN: 9 mg/dL (ref 6–23)
CALCIUM: 9.2 mg/dL (ref 8.4–10.5)
CO2: 29 meq/L (ref 19–32)
CREATININE: 0.62 mg/dL (ref 0.50–1.10)
Chloride: 102 mEq/L (ref 96–112)
Glucose, Bld: 81 mg/dL (ref 70–99)
POTASSIUM: 4.4 meq/L (ref 3.5–5.3)
Sodium: 139 mEq/L (ref 135–145)
Total Bilirubin: 0.4 mg/dL (ref 0.2–1.2)
Total Protein: 6 g/dL (ref 6.0–8.3)

## 2013-08-07 MED ORDER — PANTOPRAZOLE SODIUM 40 MG PO TBEC: 40.0000 mg | DELAYED_RELEASE_TABLET | Freq: Every day | ORAL | Status: DC

## 2013-08-07 MED ORDER — LEVOTHYROXINE SODIUM 25 MCG PO TABS: ORAL_TABLET | ORAL | Status: DC

## 2013-08-07 NOTE — Patient Instructions (Signed)
F/U in 4 month, call if you need me before  You will be referred for dexa , due now,and mammogram which is due 4/27/or after  Labs today cBC, cmp, lipid, tSH and vit D today  We will make arrangements for you to see urology in [place], and you will be called about this

## 2013-08-07 NOTE — Progress Notes (Signed)
   Subjective:    Patient ID: Rose [name], female    DOB: 1935-04-19, 79 y.o.   MRN: [id number]  HPI  Patient is in for annual exam, specifically focusing on pelvic and breast examination, but also including general physical exam Health maintainance is reviewed and updated, specifically screening tests and recommended immunizations. Recent lab and radiologic data, since previous visit is also reviewed with the patient.        Review of Systems See HPI Denies recent fever or chills. Denies sinus pressure, nasal congestion, ear pain or sore throat. Denies chest congestion, productive cough or wheezing. Denies chest pains, palpitations and leg swelling Denies abdominal pain, nausea, vomiting,diarrhea or constipation.   Denies dysuria, frequency, hesitancy or incontinence.Has h/o chronic cystitis and is opting to have her care transferred to local urologist, her spouse is quite ill and she is finding the travel to [facility] increasingly challenging, recently had an exam Denies joint pain, swelling and limitation in mobility. Denies headaches, seizures, numbness, or tingling. Denies uncontrolled  depression, anxiety or insomnia. Denies skin break down or rash.        Objective:   Physical Exam  BP 130/78  Pulse 65  Resp 16  Wt 144 lb 1.9 oz (65.372 kg)  SpO2 98% Pleasant well nourished female, alert and oriented x 3, in no cardio-pulmonary distress. Afebrile. HEENT No facial trauma or asymetry. Sinuses non tender.  EOMI, PERTL,   External ears normal, tympanic membranes clear. Oropharynx moist, no exudate, fairly good dentition. Neck: supple, no adenopathy,JVD or thyromegaly.No bruits.  Chest: Clear to ascultation bilaterally.No crackles or wheezes. Non tender to palpation  Breast: No asymetry,no masses. No nipple discharge or inversion. No axillary or supraclavicular adenopathy  Cardiovascular system; Heart sounds normal,  S1 and  S2 ,no S3.  No murmur, or  thrill. Apical beat not displaced Peripheral pulses normal.  Abdomen: Soft, non tender, no organomegaly or masses. No bruits. Bowel sounds normal. No guarding, tenderness or rebound.  Rectal:  No mass. Guaiac negative stool.  GU: External genitalia normal. No lesions. Vaginal canal normal.Physiologic, scant  Discharge.Vaginal mucosa atrophic, an friable, no lesions seen on exam Uterus absent, no adnexal masses, no  adnexal tenderness.  Musculoskeletal exam: Full ROM of spine, hips , shoulders and knees. No deformity ,swelling or crepitus noted. No muscle wasting or atrophy.   Neurologic: Cranial nerves 2 to 12 intact. Power, tone ,sensation and reflexes normal throughout. No disturbance in gait. No tremor.  Skin: Intact, no ulceration, erythema , scaling or rash noted. Pigmentation normal throughout  Psych; Normal mood and affect. Judgement and concentration normal       Assessment & Plan:  Routine general medical examination at a health care facility Patient is in for annual exam Health maintainance is reviewed and updated, specifically screening tests and recommended immunizations. . Healthy lifestyle as far as commitment to regular physical activity, heart healthy diet , safe habits, as far as seat belt  use,  Also the importance of adequate rest is also discussed.

## 2013-08-08 ENCOUNTER — Telehealth: Payer: Self-pay | Admitting: Family Medicine

## 2013-08-08 LAB — VITAMIN D 25 HYDROXY (VIT D DEFICIENCY, FRACTURES): VIT D 25 HYDROXY: 31 ng/mL (ref 30–89)

## 2013-08-08 LAB — TSH: TSH: 2.611 u[IU]/mL (ref 0.350–4.500)

## 2013-08-08 NOTE — Telephone Encounter (Signed)
Noted, will re discuss next visit

## 2013-08-27 ENCOUNTER — Other Ambulatory Visit: Payer: Self-pay | Admitting: Family Medicine

## 2013-08-28 ENCOUNTER — Telehealth: Payer: Self-pay

## 2013-08-29 NOTE — Telephone Encounter (Signed)
No refills on Abt

## 2013-08-31 DIAGNOSIS — Z1231 Encounter for screening mammogram for malignant neoplasm of breast: Secondary | ICD-10-CM | POA: Insufficient documentation

## 2013-08-31 NOTE — Assessment & Plan Note (Signed)
Patient is in for annual exam Health maintainance is reviewed and updated, specifically screening tests and recommended immunizations. . Healthy lifestyle as far as commitment to regular physical activity, heart healthy diet , safe habits, as far as seat belt  use,  Also the importance of adequate rest is also discussed.

## 2013-09-09 ENCOUNTER — Ambulatory Visit (HOSPITAL_COMMUNITY)
Admission: RE | Admit: 2013-09-09 | Discharge: 2013-09-09 | Disposition: A | Discharge status: Home / Self Care | Payer: Medicare Other | Source: Ambulatory Visit | Attending: Family Medicine | Admitting: Family Medicine

## 2013-09-09 DIAGNOSIS — Z1231 Encounter for screening mammogram for malignant neoplasm of breast: Secondary | ICD-10-CM | POA: Insufficient documentation

## 2013-09-10 ENCOUNTER — Ambulatory Visit (INDEPENDENT_AMBULATORY_CARE_PROVIDER_SITE_OTHER): Payer: Medicare Other | Admitting: Urology

## 2013-09-10 DIAGNOSIS — N3020 Other chronic cystitis without hematuria: Secondary | ICD-10-CM | POA: Diagnosis not present

## 2013-09-10 DIAGNOSIS — N952 Postmenopausal atrophic vaginitis: Secondary | ICD-10-CM

## 2013-09-10 DIAGNOSIS — R350 Frequency of micturition: Secondary | ICD-10-CM | POA: Diagnosis not present

## 2013-09-23 ENCOUNTER — Other Ambulatory Visit: Payer: Self-pay | Admitting: Family Medicine

## 2013-09-25 ENCOUNTER — Other Ambulatory Visit: Payer: Self-pay | Admitting: Family Medicine

## 2013-10-21 ENCOUNTER — Ambulatory Visit (INDEPENDENT_AMBULATORY_CARE_PROVIDER_SITE_OTHER): Payer: Medicare Other

## 2013-10-21 ENCOUNTER — Encounter: Payer: Self-pay | Admitting: Orthopedic Surgery

## 2013-10-21 ENCOUNTER — Ambulatory Visit (INDEPENDENT_AMBULATORY_CARE_PROVIDER_SITE_OTHER): Payer: Medicare Other | Admitting: Orthopedic Surgery

## 2013-10-21 VITALS — BP 153/84 | Ht 65.0 in | Wt 151.0 lb

## 2013-10-21 DIAGNOSIS — M1710 Unilateral primary osteoarthritis, unspecified knee: Secondary | ICD-10-CM | POA: Diagnosis not present

## 2013-10-21 DIAGNOSIS — M25569 Pain in unspecified knee: Secondary | ICD-10-CM | POA: Diagnosis not present

## 2013-10-21 DIAGNOSIS — M25562 Pain in left knee: Secondary | ICD-10-CM

## 2013-10-21 DIAGNOSIS — M179 Osteoarthritis of knee, unspecified: Secondary | ICD-10-CM

## 2013-10-21 DIAGNOSIS — IMO0002 Reserved for concepts with insufficient information to code with codable children: Secondary | ICD-10-CM

## 2013-10-21 NOTE — Progress Notes (Signed)
Patient ID: Rose [name], female   DOB: 09/06/1934, 79 y.o.   MRN: [id number]  Chief Complaint  Patient presents with  . Knee Pain    left knee pain, no injury    45 six-year[age]old female presented evaluated for left knee pain for valgus osteoarthritis treated with injection back in 2013 x-ray showed arthritis at that time she's done well has some soreness in the left knee without disabling pain. She would like to be reevaluated. She's not having any mechanical symptoms in the knee. She does have discomfort and soreness as she describes it.  Review of systems negative  Denies numbness or tingling denies any other instability symptoms in the joint  Past Medical History  Diagnosis Date  . Dyslipidemia   . Chronic anxiety   . Hypothyroidism   . PAC (premature atrial contraction)     Arrythmia     Vital signs: BP 153/84  Ht 5\' 5"  (1.651 m)  Wt 151 lb (68.493 kg)  BMI 25.13 kg/m2   General the patient is well-developed and well-nourished grooming and hygiene are normal Oriented x3 Mood and affect normal Ambulation no support for gait she does have a valgus alignment to the left knee there is no joint effusion range of motion is 115 her knee is stable she is no joint line tenderness motor exam is normal skin is clean dry is intact pulses are good sensation is normal  Repeat x-rays show valgus osteoarthritis of the left knee.  Recommend 1 year followup call if pain increases or becomes disabling before that time  Encounter Diagnosis  Name Primary?  . Left knee pain Yes

## 2013-11-05 DIAGNOSIS — M549 Dorsalgia, unspecified: Secondary | ICD-10-CM | POA: Diagnosis not present

## 2013-11-25 ENCOUNTER — Ambulatory Visit (INDEPENDENT_AMBULATORY_CARE_PROVIDER_SITE_OTHER): Payer: Medicare Other | Admitting: Family Medicine

## 2013-11-25 ENCOUNTER — Encounter (INDEPENDENT_AMBULATORY_CARE_PROVIDER_SITE_OTHER): Payer: Self-pay

## 2013-11-25 ENCOUNTER — Encounter: Payer: Self-pay | Admitting: Family Medicine

## 2013-11-25 VITALS — BP 130/80 | HR 68 | Resp 16 | Wt 150.0 lb

## 2013-11-25 DIAGNOSIS — Z23 Encounter for immunization: Secondary | ICD-10-CM | POA: Diagnosis not present

## 2013-11-25 DIAGNOSIS — E039 Hypothyroidism, unspecified: Secondary | ICD-10-CM | POA: Diagnosis not present

## 2013-11-25 DIAGNOSIS — F411 Generalized anxiety disorder: Secondary | ICD-10-CM | POA: Diagnosis not present

## 2013-11-25 DIAGNOSIS — M1710 Unilateral primary osteoarthritis, unspecified knee: Secondary | ICD-10-CM

## 2013-11-25 DIAGNOSIS — Z13820 Encounter for screening for osteoporosis: Secondary | ICD-10-CM

## 2013-11-25 DIAGNOSIS — R5383 Other fatigue: Secondary | ICD-10-CM | POA: Diagnosis not present

## 2013-11-25 DIAGNOSIS — G4700 Insomnia, unspecified: Secondary | ICD-10-CM

## 2013-11-25 DIAGNOSIS — R5381 Other malaise: Secondary | ICD-10-CM

## 2013-11-25 DIAGNOSIS — M1712 Unilateral primary osteoarthritis, left knee: Secondary | ICD-10-CM

## 2013-11-25 MED ORDER — LEVOTHYROXINE SODIUM 25 MCG PO TABS: ORAL_TABLET | ORAL | Status: DC

## 2013-11-25 MED ORDER — BUSPIRONE HCL 7.5 MG PO TABS: ORAL_TABLET | ORAL | Status: DC

## 2013-11-25 MED ORDER — PANTOPRAZOLE SODIUM 40 MG PO TBEC: 40.0000 mg | DELAYED_RELEASE_TABLET | Freq: Every day | ORAL | Status: DC

## 2013-11-25 MED ORDER — CITALOPRAM HYDROBROMIDE 10 MG PO TABS: ORAL_TABLET | ORAL | Status: DC

## 2013-11-25 NOTE — Progress Notes (Signed)
   Subjective:    Patient ID: Rose [name], female    DOB: [birth year], 79 y.o.   MRN: [id number]  HPI The PT is here for follow up and re-evaluation of chronic medical conditions, medication management and review of any available recent lab and radiology data.  Preventive health is updated, specifically  Cancer screening and Immunization.   Questions or concerns regarding consultations or procedures which the PT has had in the interim are  Addressed.Recently saw ortho re left knee pain but reports ongoing/worsenmoing pain and instability requests intra articular injection The PT denies any adverse reactions to current medications since the last visit.  Increased fatigue due to poor sleep she believes form stress at hime and uncontrolled knee pain, no position of comfort     Review of Systems See HPI Denies recent fever or chills. Denies sinus pressure, nasal congestion, ear pain or sore throat. Denies chest congestion, productive cough or wheezing. Denies chest pains, palpitations and leg swelling Denies abdominal pain, nausea, vomiting,diarrhea or constipation.   Denies dysuria, frequency, hesitancy or incontinence.  Denies headaches, seizures, numbness, or tingling. Denies uncontrolled depression, anxiety or insomnia. Denies skin break down or rash.        Objective:   Physical Exam BP 130/80  Pulse 68  Resp 16  Wt 150 lb (68.04 kg)  SpO2 98% Patient alert and oriented and in no cardiopulmonary distress.  HEENT: No facial asymmetry, EOMI,   oropharynx pink and moist.  Neck supple no JVD, no mass.  Chest: Clear to auscultation bilaterally.  CVS: S1, S2 no murmurs, no S3.Regular rate.  ABD: Soft non tender.   Ext: No edema  MS: Adequate ROM spine, shoulders, hips and reduced in left  knee.  Skin: Intact, no ulcerations or rash noted.  Psych: Good eye contact, normal affect. Memory intact mildly  anxious not  depressed appearing.  CNS: CN 2-12 intact, power,   normal throughout.no focal deficits noted.        Assessment & Plan:  HYPOTHYROIDISM Controlled, no change in medication Updated lab needed at/ before next visit.   ANXIETY, CHRONIC Controlled, no change in medication Improved on double therapy ,pt actually managing extremely well despite illness of her spouse  OA (osteoarthritis) of knee Increased left knee pain and instability, requests inar articular injection, will communicate this to her ortho Doc  Fall precautions reviewed  Insomnia Sleep hygiene reviewed and written information offered also. Prescription sent for  medication needed.   Need for vaccination with 13-polyvalent pneumococcal conjugate vaccine prevnar administered at visit

## 2013-11-25 NOTE — Patient Instructions (Addendum)
Annual wellness in 3 month, call if you need me before  Prevnar today  You are referred for bone density test  I will be in touch with Dr Aline Brochure and get back to you re left knee pain  Tsh  Non fast in 3 month

## 2013-11-27 ENCOUNTER — Telehealth: Payer: Self-pay | Admitting: Family Medicine

## 2013-11-27 DIAGNOSIS — M25569 Pain in unspecified knee: Principal | ICD-10-CM

## 2013-11-27 DIAGNOSIS — G8929 Other chronic pain: Secondary | ICD-10-CM

## 2013-11-27 NOTE — Telephone Encounter (Signed)
Pls contact pt , let her know that Dr Roseanna Rainbow is out of the office  until next week, so I will not be able to hear from him re her request for injection into the knee before that time.  Hopefully she can wait on this, (already half this week is gone, so, not likley that i will be able to locate another ortho sooner ) If an issue offer Dr Luna Glasgow who tends to see people on Friday mornings or ,maybe ortho in [place] etc  Let me know if an issue pls

## 2013-11-28 ENCOUNTER — Telehealth: Payer: Self-pay | Admitting: Family Medicine

## 2013-11-28 NOTE — Telephone Encounter (Signed)
Noted  

## 2013-11-28 NOTE — Addendum Note (Signed)
Addended by: [name] E on: 11/28/2013 12:22 PM   Modules accepted: Orders

## 2013-11-28 NOTE — Telephone Encounter (Signed)
I will put referral in and see if Dr Ruthe Mannan sched will accommodate her on Monday, but explain to her that I will be unable to have ANY communication with him before Monday, so he may not be aware that the ONLY reason she is going back is to get an injection in her knweee, and that is his decision to make (she just saw him, and no injection was offered at that visit)

## 2013-11-28 NOTE — Telephone Encounter (Signed)
Patient is aware. States that she really needs to try to get this on Monday as she does [employer] Whe[employer] the rest of the week.  Please advise if any.

## 2013-12-08 ENCOUNTER — Other Ambulatory Visit: Payer: Self-pay | Admitting: Family Medicine

## 2013-12-16 DIAGNOSIS — R5381 Other malaise: Secondary | ICD-10-CM | POA: Diagnosis not present

## 2013-12-16 DIAGNOSIS — R5383 Other fatigue: Secondary | ICD-10-CM | POA: Diagnosis not present

## 2013-12-17 LAB — TSH: TSH: 3.773 u[IU]/mL (ref 0.350–4.500)

## 2013-12-17 NOTE — Telephone Encounter (Signed)
appt with Dr. Aline Brochure 8/13

## 2013-12-18 ENCOUNTER — Other Ambulatory Visit (HOSPITAL_COMMUNITY): Payer: Medicare Other

## 2013-12-21 ENCOUNTER — Other Ambulatory Visit: Payer: Self-pay | Admitting: Family Medicine

## 2013-12-24 ENCOUNTER — Encounter: Payer: Self-pay | Admitting: Family Medicine

## 2013-12-24 ENCOUNTER — Ambulatory Visit (INDEPENDENT_AMBULATORY_CARE_PROVIDER_SITE_OTHER): Payer: Medicare Other | Admitting: Family Medicine

## 2013-12-24 ENCOUNTER — Encounter (INDEPENDENT_AMBULATORY_CARE_PROVIDER_SITE_OTHER): Payer: Self-pay

## 2013-12-24 VITALS — BP 158/80 | HR 64 | Resp 16 | Ht 65.0 in | Wt 149.1 lb

## 2013-12-24 DIAGNOSIS — K219 Gastro-esophageal reflux disease without esophagitis: Secondary | ICD-10-CM

## 2013-12-24 DIAGNOSIS — R030 Elevated blood-pressure reading, without diagnosis of hypertension: Secondary | ICD-10-CM | POA: Diagnosis not present

## 2013-12-24 DIAGNOSIS — E039 Hypothyroidism, unspecified: Secondary | ICD-10-CM

## 2013-12-24 DIAGNOSIS — F411 Generalized anxiety disorder: Secondary | ICD-10-CM

## 2013-12-24 DIAGNOSIS — I491 Atrial premature depolarization: Secondary | ICD-10-CM | POA: Insufficient documentation

## 2013-12-24 DIAGNOSIS — I499 Cardiac arrhythmia, unspecified: Secondary | ICD-10-CM | POA: Diagnosis not present

## 2013-12-24 MED ORDER — BUSPIRONE HCL 7.5 MG PO TABS: ORAL_TABLET | ORAL | Status: DC

## 2013-12-24 MED ORDER — CLONAZEPAM 0.5 MG PO TABS: ORAL_TABLET | ORAL | Status: DC

## 2013-12-24 NOTE — Patient Instructions (Addendum)
F/u as before  You need to get back on medication you take regularly for anxiety , so this will help to "clam you" so you do not rush as much I am holding off of blood pressure medication now, and doing and eKG.  Your EKG shows a slower heart rate with some irregularlity. You have had this before. You need to go back for the heart specialist to evaluate your heart  Blood pressure got significantly lower at times in the office (140/80)  Work on eating less salty/processed foods and more fresh vegetable and fruit   You need to look for a little extra help at home since your husband is no longer able to do much

## 2013-12-25 ENCOUNTER — Other Ambulatory Visit: Payer: Self-pay | Admitting: Family Medicine

## 2013-12-26 ENCOUNTER — Ambulatory Visit (INDEPENDENT_AMBULATORY_CARE_PROVIDER_SITE_OTHER): Payer: Medicare Other | Admitting: Orthopedic Surgery

## 2013-12-26 VITALS — BP 118/72 | Ht 65.0 in | Wt 149.1 lb

## 2013-12-26 DIAGNOSIS — M1710 Unilateral primary osteoarthritis, unspecified knee: Secondary | ICD-10-CM | POA: Diagnosis not present

## 2013-12-26 DIAGNOSIS — M25562 Pain in left knee: Secondary | ICD-10-CM

## 2013-12-26 DIAGNOSIS — M1712 Unilateral primary osteoarthritis, left knee: Secondary | ICD-10-CM

## 2013-12-26 DIAGNOSIS — M25569 Pain in unspecified knee: Secondary | ICD-10-CM

## 2013-12-26 MED ORDER — TRAMADOL-ACETAMINOPHEN 37.5-325 MG PO TABS: 1.0000 | ORAL_TABLET | ORAL | Status: DC | PRN

## 2013-12-26 NOTE — Patient Instructions (Signed)
Joint Injection  Care After  Refer to this sheet in the next few days. These instructions provide you with information on caring for yourself after you have had a joint injection. Your caregiver also may give you more specific instructions. Your treatment has been planned according to current medical practices, but problems sometimes occur. Call your caregiver if you have any problems or questions after your procedure.  After any type of joint injection, it is not uncommon to experience:  · Soreness, swelling, or bruising around the injection site.  · Mild numbness, tingling, or weakness around the injection site caused by the numbing medicine used before or with the injection.  It also is possible to experience the following effects associated with the specific agent after injection:  · Iodine-based contrast agents:  ¨ Allergic reaction (itching, hives, widespread redness, and swelling beyond the injection site).  · Corticosteroids (These effects are rare.):  ¨ Allergic reaction.  ¨ Increased blood sugar levels (If you have diabetes and you notice that your blood sugar levels have increased, notify your caregiver).  ¨ Increased blood pressure levels.  ¨ Mood swings.  · Hyaluronic acid in the use of viscosupplementation.  ¨ Temporary heat or redness.  ¨ Temporary rash and itching.  ¨ Increased fluid accumulation in the injected joint.  These effects all should resolve within a day after your procedure.   HOME CARE INSTRUCTIONS  · Limit yourself to light activity the day of your procedure. Avoid lifting heavy objects, bending, stooping, or twisting.  · Take prescription or over-the-counter pain medication as directed by your caregiver.  · You may apply ice to your injection site to reduce pain and swelling the day of your procedure. Ice may be applied 03-04 times:  ¨ Put ice in a plastic bag.  ¨ Place a towel between your skin and the bag.  ¨ Leave the ice on for no longer than 15-20 minutes each time.  SEEK  IMMEDIATE MEDICAL CARE IF:   · Pain and swelling get worse rather than better or extend beyond the injection site.  · Numbness does not go away.  · Blood or fluid continues to leak from the injection site.  · You have chest pain.  · You have swelling of your face or tongue.  · You have trouble breathing or you become dizzy.  · You develop a fever, chills, or severe tenderness at the injection site that last longer than 1 day.  MAKE SURE YOU:  · Understand these instructions.  · Watch your condition.  · Get help right away if you are not doing well or if you get worse.  Document Released: 01/13/2011 Document Revised: 07/25/2011 Document Reviewed: 01/13/2011  [employer]® Patient Information ©2015 [employer]. This information is not intended to replace advice given to you by your health care provider. Make sure you discuss any questions you have with your health care provider.

## 2013-12-26 NOTE — Progress Notes (Signed)
Chief Complaint  Patient presents with  . Knee Pain    Left knee pain, requests injection    Left knee pain and left knee arthritis previous x-ray shows arthritis  Left knee injection  Knee  Injection Procedure Note  Pre-operative Diagnosis: left knee oa  Post-operative Diagnosis: same  Indications: pain  Anesthesia: ethyl chloride   Procedure Details   Verbal consent was obtained for the procedure. Time out was completed.The joint was prepped with alcohol, followed by  Ethyl chloride spray and A 20 gauge needle was inserted into the knee via lateral approach; 23ml 1% lidocaine and 1 ml of depomedrol  was then injected into the joint . The needle was removed and the area cleansed and dressed.  Complications:  None; patient tolerated the procedure well.

## 2014-01-08 ENCOUNTER — Encounter: Payer: Self-pay | Admitting: Cardiology

## 2014-01-08 ENCOUNTER — Ambulatory Visit (INDEPENDENT_AMBULATORY_CARE_PROVIDER_SITE_OTHER): Payer: Medicare Other | Admitting: Cardiology

## 2014-01-08 VITALS — BP 122/72 | HR 73 | Ht 64.0 in | Wt 148.0 lb

## 2014-01-08 DIAGNOSIS — Z23 Encounter for immunization: Secondary | ICD-10-CM | POA: Diagnosis not present

## 2014-01-08 DIAGNOSIS — I491 Atrial premature depolarization: Secondary | ICD-10-CM

## 2014-01-08 DIAGNOSIS — E785 Hyperlipidemia, unspecified: Secondary | ICD-10-CM | POA: Diagnosis not present

## 2014-01-08 NOTE — Assessment & Plan Note (Signed)
Lipid numbers from March reviewed, LDL was 89. Keep followup with Dr. Moshe Cipro.

## 2014-01-08 NOTE — Patient Instructions (Signed)
Your physician recommends that you schedule a follow-up as needed with Dr. Domenic Polite  Your physician recommends that you continue on your current medications as directed. Please refer to the Current Medication list given to you today.  Thank you for choosing [facility]!!

## 2014-01-08 NOTE — Progress Notes (Signed)
Clinical Summary Rose [name] is a 79 y.o.female referred for cardiology consultation by Dr. Moshe [name]. She is referred after recent examination to review her ECG. Patient has a history of "irregular heartbeat" although denies to me specifically any sense of palpitations, dizziness, or chest pain. The ECG done just recently showed sinus rhythm with PACs, nonspecific T-wave changes.  She was evaluated by our practice in the past by Dr. Harrington [name] in 2011, and had a 24-hour Holter monitor performed demonstrating sinus rhythm with frequent PACs, single PVC, no sustained arrhythmias. Echocardiogram at that time showed upper normal LV wall thickness with LVEF 60-65%, and no major valvular abnormalities.  She tells me that she has been under a lot of stress, dealing with an illness in her husband, also suffers with anxiety. Feels tired, but does not endorse any exertional chest pain or progressive shortness of breath. I reviewed her medications. Recent TSH normal at 3.7. Lipid panel back in March showed LDL 89.   Allergies  Allergen Reactions  . Penicillins     Current Outpatient Prescriptions  Medication Sig Dispense Refill  . busPIRone (BUSPAR) 7.5 MG tablet TAKE 1 TABLET TWICE DAILY  60 tablet  4  . citalopram (CELEXA) 10 MG tablet TAKE 1 TABLET BY MOUTH EVERY DAY  30 tablet  4  . clonazePAM (KLONOPIN) 0.5 MG tablet TAKE 1 TABLET TWICE A DAY AS NEEDED FOR ANXIETY.  30 tablet  3  . ergocalciferol (VITAMIN D2) 50000 UNITS capsule Take 1 capsule (50,000 Units total) by mouth once a week. One capsule once weekly  12 capsule  1  . levothyroxine (SYNTHROID, LEVOTHROID) 25 MCG tablet TAKE 1 TABLET BY MOUTH EVERY DAY  30 tablet  4  . Multiple Vitamin (MULTIVITAMIN PO) Take by mouth daily.        . pantoprazole (PROTONIX) 40 MG tablet Take 1 tablet (40 mg total) by mouth daily.  30 tablet  4  . traMADol-acetaminophen (ULTRACET) 37.5-325 MG per tablet Take 1 tablet by mouth every 4 (four) hours as needed.  84  tablet  0  . aspirin (BAYER ASPIRIN EC LOW DOSE) 81 MG EC tablet Take 81 mg by mouth daily.        . Calcium 500-125 MG-UNIT TABS Take by mouth 3 (three) times daily.         No current facility-administered medications for this visit.    Past Medical History  Diagnosis Date  . Dyslipidemia   . Chronic anxiety   . Hypothyroidism   . Palpitations     Frequent PACs by Holter monitor June 2011    Past Surgical History  Procedure Laterality Date  . Appendectomy    . Cholecystectomy    . Partial hysterectomy    . Colonoscopy  09/08/2011    Procedure: COLONOSCOPY;  Surgeon: Rose Houston, MD;  Location: [facility];  Service: Endoscopy;  Laterality: N/A;  2:10  . Abdominal hysterectomy      Family History  Problem Relation Age of Onset  . Colon cancer Mother   . Colon cancer Father   . Hypertension Sister   . Hypertension Sister   . Colon cancer Brother   . Cancer Brother     Social History [name] reports that she has never smoked. She does not have any smokeless tobacco history on file. [name] reports that she does not drink alcohol.  Review of Systems Other systems reviewed and negative except as outlined.  Physical Examination Filed Vitals:  01/08/14 0833  BP: 122/72  Pulse: 73   Filed Weights   01/08/14 0833  Weight: 148 lb (67.132 kg)   Abnormal nourished appearing woman in no distress. HEENT: Conjunctiva and lids normal, oropharynx clear. Neck: Supple, no elevated JVP or carotid bruits, no thyromegaly. Lungs: Clear to auscultation, nonlabored breathing at rest. Cardiac: Regular rate and rhythm, no S3 or significant systolic murmur, no pericardial rub. Abdomen: Soft, nontender,bowel sounds present. Extremities: No pitting edema, distal pulses 2+. Skin: Warm and dry. Musculoskeletal: No kyphosis. Neuropsychiatric: Alert and oriented x3, affect grossly appropriate.   Problem List and Plan   Premature atrial complexes Not clearly  symptomatic at this point, overall benign. She had prior cardiac monitoring and structural workup as detailed above. She does not endorse any true sense of palpitations, dizziness, or syncope. No clear indication for medical treatment at this time (i.e. beta blocker or calcium channel blocker).  DYSLIPIDEMIA Lipid numbers from March reviewed, LDL was 89. Keep followup with Dr. Moshe [name].    Rose [name], M.D., F.A.C.C.

## 2014-01-08 NOTE — Assessment & Plan Note (Signed)
Not clearly symptomatic at this point, overall benign. She had prior cardiac monitoring and structural workup as detailed above. She does not endorse any true sense of palpitations, dizziness, or syncope. No clear indication for medical treatment at this time (i.e. beta blocker or calcium channel blocker).

## 2014-01-09 ENCOUNTER — Encounter: Payer: Self-pay | Admitting: Cardiology

## 2014-01-16 DIAGNOSIS — D2360 Other benign neoplasm of skin of unspecified upper limb, including shoulder: Secondary | ICD-10-CM | POA: Diagnosis not present

## 2014-01-22 ENCOUNTER — Ambulatory Visit (INDEPENDENT_AMBULATORY_CARE_PROVIDER_SITE_OTHER): Payer: Medicare Other | Admitting: Family Medicine

## 2014-01-22 ENCOUNTER — Encounter: Payer: Self-pay | Admitting: Family Medicine

## 2014-01-22 ENCOUNTER — Telehealth: Payer: Self-pay

## 2014-01-22 VITALS — BP 112/68 | HR 62 | Resp 18 | Ht 65.0 in | Wt 150.1 lb

## 2014-01-22 DIAGNOSIS — F411 Generalized anxiety disorder: Secondary | ICD-10-CM

## 2014-01-22 DIAGNOSIS — G44209 Tension-type headache, unspecified, not intractable: Secondary | ICD-10-CM | POA: Diagnosis not present

## 2014-01-22 NOTE — Patient Instructions (Signed)
F/u as before  Your neurologic exam is normal.  Your headache is due to stress and worry  PLEASE try to not let situations that you cannot change make you sick.  Tylenol two tablets today, I hope that talking about this has helped

## 2014-01-22 NOTE — Telephone Encounter (Signed)
Work in today

## 2014-01-26 DIAGNOSIS — I499 Cardiac arrhythmia, unspecified: Secondary | ICD-10-CM | POA: Insufficient documentation

## 2014-01-26 DIAGNOSIS — Z23 Encounter for immunization: Secondary | ICD-10-CM | POA: Insufficient documentation

## 2014-01-26 NOTE — Assessment & Plan Note (Signed)
Controlled, no change in medication  

## 2014-01-26 NOTE — Assessment & Plan Note (Signed)
Controlled, no change in medication Improved on double therapy ,pt actually managing extremely well despite illness of her spouse

## 2014-01-26 NOTE — Assessment & Plan Note (Signed)
Increased left knee pain and instability, requests inar articular injection, will communicate this to her ortho Doc  Fall precautions reviewed

## 2014-01-26 NOTE — Assessment & Plan Note (Signed)
Sleep hygiene reviewed and written information offered also. Prescription sent for  medication needed.  

## 2014-01-26 NOTE — Assessment & Plan Note (Signed)
Irregular heart rate with associated fatigue and increased episodes of sweating. Pt has been evaluated in the past by cardiology and needs re eval

## 2014-01-26 NOTE — Assessment & Plan Note (Signed)
Increased and uncontrolled due to new [problems in her home in recent times. Pt verbalized her fears and agitation about the situation for apprrox 7 minutes, and this hopefully has alleviated some of her stress. She is also reminded of the access to psychologist , though I do not think this is currently medically necessary , neither does she

## 2014-01-26 NOTE — Assessment & Plan Note (Signed)
Elevated BP at  Visit , which after a while when she was less anxious , lowered, no medication to be started for BP. Currently out of anxiolytic agents which are contributing to her symptoms

## 2014-01-26 NOTE — Progress Notes (Signed)
   Subjective:    Patient ID: Rose [name], female    DOB: 1934-11-18, 79 y.o.   MRN: [id number]  HPI 1 mponth h/o excessive sweating , with and without activity, some exertional fatigue ad occasionally feels as though her heart is racing She does c/o of chest discomfort at times, not necessarily related to activity , sometimes to eating Doe c/o increased anxiety in the past week, she needs to collect her medication, overwhelmed at times having to care g for her very ill spouse, they have no children , so she depends on her siblings and family, who do offer a significant amount of support   Review of Systems See HPI Denies recent fever or chills.c/o increased fatigue Denies sinus pressure, nasal congestion, ear pain or sore throat. Denies chest congestion, productive cough or wheezing. Denies PNd, orthopnea and leg swelling Denies abdominal pain, nausea, vomiting,diarrhea or constipation.   Denies dysuria, frequency, hesitancy or incontinence. Denies joint pain, swelling and limitation in mobility. Denies headaches, seizures, numbness, or tingling.  Denies skin break down or rash.        Objective:   Physical Exam BP 158/80  Pulse 64  Resp 16  Ht 5\' 5"  (1.651 m)  Wt 149 lb 1.9 oz (67.64 kg)  BMI 24.81 kg/m2  SpO2 100% Patient alert and oriented and in no obvious  cardiopulmonary distress.Pt ha sincreased anxiety  HEENT: No facial asymmetry, EOMI,   oropharynx pink and moist.  Neck supple no JVD, no mass.  Chest: Clear to auscultation bilaterally.no re[producible chest wall pain  CVS: S1, S2 no murmurs, no S3.IrRegular rate.  ABD: Soft non tender.   Ext: No edema  MS: Adequate ROM spine, shoulders, hips and knees.  Skin: Intact, no ulcerations or rash noted.  Psych: Good eye contact, normal affect. Memory intact  anxious not  depressed appearing.  CNS: CN 2-12 intact, power,  normal throughout.no focal deficits noted.        Assessment & Plan:  Irregular  heart rate Irregular heart rate with associated fatigue and increased episodes of sweating. Pt has been evaluated in the past by cardiology and needs re eval  Borderline systolic HTN Elevated BP at  Visit , which after a while when she was less anxious , lowered, no medication to be started for BP. Currently out of anxiolytic agents which are contributing to her symptoms  HYPOTHYROIDISM Controlled therefor no t contributing to current symptoms  GERD (gastroesophageal reflux disease) Controlled, no change in medication   ANXIETY, CHRONIC Increased and uncontrolled currently, as pt out of one of her meds , she is to collect same a end of visit

## 2014-01-26 NOTE — Assessment & Plan Note (Signed)
Controlled, no change in medication Updated lab needed at/ before next visit.  
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
## 2014-01-26 NOTE — Assessment & Plan Note (Signed)
Tension headache,normal neurologic exam and no alarming symptoms Pt to call back if headache persists or worsens, tylenol 2 tabs at [facility]

## 2014-01-26 NOTE — Assessment & Plan Note (Signed)
Increased and uncontrolled currently, as pt out of one of her meds , she is to collect same a end of visit

## 2014-01-26 NOTE — Assessment & Plan Note (Signed)
Controlled therefor no t contributing to current symptoms

## 2014-01-26 NOTE — Assessment & Plan Note (Signed)
prevnar administered at visit

## 2014-01-28 ENCOUNTER — Ambulatory Visit (INDEPENDENT_AMBULATORY_CARE_PROVIDER_SITE_OTHER): Payer: Medicare Other | Admitting: Orthopedic Surgery

## 2014-01-28 VITALS — BP 112/87 | Ht 65.0 in | Wt 150.2 lb

## 2014-01-28 DIAGNOSIS — M1710 Unilateral primary osteoarthritis, unspecified knee: Secondary | ICD-10-CM | POA: Diagnosis not present

## 2014-01-28 DIAGNOSIS — M1712 Unilateral primary osteoarthritis, left knee: Secondary | ICD-10-CM | POA: Insufficient documentation

## 2014-01-28 DIAGNOSIS — M25569 Pain in unspecified knee: Secondary | ICD-10-CM | POA: Diagnosis not present

## 2014-01-28 DIAGNOSIS — M25562 Pain in left knee: Secondary | ICD-10-CM

## 2014-01-28 NOTE — Progress Notes (Signed)
Chief Complaint  Patient presents with  . Follow-up    1 month recheck left knee s/p injection/med    I would like a new injection   Good relief from previous injection she would like a repeat injection. She's not having to take an anti-inflammatory medication after her injection  Repeat injection left knee for osteoarthritis  Procedure note left knee injection verbal consent was obtained to inject left knee joint  Timeout was completed to confirm the site of injection  The medications used were 40 mg of Depo-Medrol and 1% lidocaine 3 cc  Anesthesia was provided by ethyl chloride and the skin was prepped with alcohol.  After cleaning the skin with alcohol a 20-gauge needle was used to inject the left knee joint. There were no complications. A sterile bandage was applied.

## 2014-02-12 ENCOUNTER — Telehealth: Payer: Self-pay | Admitting: Family Medicine

## 2014-02-12 NOTE — Telephone Encounter (Signed)
pls order cbc, chem 7 and UA in lab today, since wants to be seen today, this is the best can be done Document presence or absence of fever, chills, cough , urinary symptoms Reassure her thyroid normal; in August, was just checked by cardiology for excess sweat  Offer appt next week pls, advise urgent care if worsens,per routine

## 2014-02-12 NOTE — Telephone Encounter (Signed)
For the past week she has been sweating excessively. WIll run down her back and drip off there ends of her hair. No other symptoms. Per Luann, no appts but patient wants to be checked. Please advise

## 2014-02-12 NOTE — Telephone Encounter (Signed)
Pt was given an appt for tomorrow. Ok per Dr

## 2014-02-13 ENCOUNTER — Encounter: Payer: Self-pay | Admitting: Family Medicine

## 2014-02-13 ENCOUNTER — Encounter (INDEPENDENT_AMBULATORY_CARE_PROVIDER_SITE_OTHER): Payer: Self-pay

## 2014-02-13 ENCOUNTER — Ambulatory Visit (INDEPENDENT_AMBULATORY_CARE_PROVIDER_SITE_OTHER): Payer: Medicare Other | Admitting: Family Medicine

## 2014-02-13 VITALS — BP 126/74 | HR 64 | Resp 18 | Ht 65.0 in | Wt 154.0 lb

## 2014-02-13 DIAGNOSIS — E039 Hypothyroidism, unspecified: Secondary | ICD-10-CM

## 2014-02-13 DIAGNOSIS — N951 Menopausal and female climacteric states: Secondary | ICD-10-CM | POA: Diagnosis not present

## 2014-02-13 DIAGNOSIS — M5489 Other dorsalgia: Secondary | ICD-10-CM | POA: Diagnosis not present

## 2014-02-13 DIAGNOSIS — R350 Frequency of micturition: Secondary | ICD-10-CM

## 2014-02-13 DIAGNOSIS — R5383 Other fatigue: Secondary | ICD-10-CM | POA: Diagnosis not present

## 2014-02-13 DIAGNOSIS — F411 Generalized anxiety disorder: Secondary | ICD-10-CM

## 2014-02-13 DIAGNOSIS — M549 Dorsalgia, unspecified: Secondary | ICD-10-CM | POA: Insufficient documentation

## 2014-02-13 DIAGNOSIS — R232 Flushing: Secondary | ICD-10-CM | POA: Insufficient documentation

## 2014-02-13 LAB — POCT URINALYSIS DIPSTICK
Bilirubin, UA: NEGATIVE
Glucose, UA: NEGATIVE
Ketones, UA: NEGATIVE
Leukocytes, UA: NEGATIVE
NITRITE UA: NEGATIVE
PH UA: 7
PROTEIN UA: NEGATIVE
SPEC GRAV UA: 1.025
UROBILINOGEN UA: 0.2

## 2014-02-13 MED ORDER — METHYLPREDNISOLONE ACETATE 80 MG/ML IJ SUSP
80.0000 mg | Freq: Once | INTRAMUSCULAR | Status: AC
  Administered 2014-02-13: 80 mg via INTRAMUSCULAR

## 2014-02-13 MED ORDER — KETOROLAC TROMETHAMINE 60 MG/2ML IM SOLN
60.0000 mg | Freq: Once | INTRAMUSCULAR | Status: AC
  Administered 2014-02-13: 60 mg via INTRAMUSCULAR

## 2014-02-13 NOTE — Patient Instructions (Addendum)
F/u as before  Labs today, CBc , and chem 7 , TSh today  Urine  Shows no infection, two injections in office  today for back pain  Call if symptoms worsen

## 2014-02-14 LAB — CBC WITH DIFFERENTIAL/PLATELET
BASOS ABS: 0 10*3/uL (ref 0.0–0.1)
Basophils Relative: 1 % (ref 0–1)
Eosinophils Absolute: 0.1 10*3/uL (ref 0.0–0.7)
Eosinophils Relative: 2 % (ref 0–5)
HEMATOCRIT: 39 % (ref 36.0–46.0)
Hemoglobin: 13.2 g/dL (ref 12.0–15.0)
LYMPHS PCT: 46 % (ref 12–46)
Lymphs Abs: 2.2 10*3/uL (ref 0.7–4.0)
MCH: 28.7 pg (ref 26.0–34.0)
MCHC: 33.8 g/dL (ref 30.0–36.0)
MCV: 84.8 fL (ref 78.0–100.0)
MONO ABS: 0.4 10*3/uL (ref 0.1–1.0)
Monocytes Relative: 9 % (ref 3–12)
NEUTROS ABS: 2 10*3/uL (ref 1.7–7.7)
Neutrophils Relative %: 42 % — ABNORMAL LOW (ref 43–77)
Platelets: 231 10*3/uL (ref 150–400)
RBC: 4.6 MIL/uL (ref 3.87–5.11)
RDW: 14.5 % (ref 11.5–15.5)
WBC: 4.7 10*3/uL (ref 4.0–10.5)

## 2014-02-14 LAB — TSH: TSH: 4.461 u[IU]/mL (ref 0.350–4.500)

## 2014-02-14 LAB — BASIC METABOLIC PANEL
BUN: 8 mg/dL (ref 6–23)
CALCIUM: 9.4 mg/dL (ref 8.4–10.5)
CO2: 32 meq/L (ref 19–32)
CREATININE: 0.72 mg/dL (ref 0.50–1.10)
Chloride: 100 mEq/L (ref 96–112)
GLUCOSE: 92 mg/dL (ref 70–99)
Potassium: 4.4 mEq/L (ref 3.5–5.3)
Sodium: 138 mEq/L (ref 135–145)

## 2014-02-15 DIAGNOSIS — R350 Frequency of micturition: Secondary | ICD-10-CM | POA: Insufficient documentation

## 2014-02-15 NOTE — Progress Notes (Signed)
   Subjective:    Patient ID: Rose [name], female    DOB: Jun 13, 1934, 79 y.o.   MRN: [id number]  HPI Ptr in with 2 day h/o excessive sweating with and without activity and increased urinary frequency and back pain which is localized. Denies fever or chills, denies respiratory symptoms. Ongoing attempt to discuss personal issues such as the fact that she has a young man who she and her husband essentially treated " as their own" helping with education etc, he is in touch but lives [place]. Is experiencing increased anxiety , though in denial , re aging, ownership of property etc   Review of Systems See HPI Denies recent fever or chills. Denies sinus pressure, nasal congestion, ear pain or sore throat. Denies chest congestion, productive cough or wheezing. Denies chest pains, palpitations and leg swelling Denies abdominal pain, nausea, vomiting,diarrhea or constipation.   Denies dysuria,2 day h/o frequency Denies headaches, seizures, numbness, or tingling. Denies skin break down or rash.        Objective:   Physical Exam BP 126/74  Pulse 64  Resp 18  Ht 5\' 5"  (1.651 m)  Wt 154 lb (69.854 kg)  BMI 25.63 kg/m2  SpO2 97% Patient alert and oriented and in no cardiopulmonary distress.  HEENT: No facial asymmetry, EOMI,   oropharynx pink and moist.  Neck supple no JVD, no mass.  Chest: Clear to auscultation bilaterally.  CVS: S1, S2 no murmurs, no S3.Regular rate.  ABD: Soft non tender. No renal angle or supra pubic tenderness  Ext: No edema  MS: Reduced  ROM lumbar  Spine,adequate in shoulders, hips and knees.Localized tenderness over right SI joint  Skin: Intact, no ulcerations or rash noted.  Psych: Good eye contact, normal affect. Memory intact not anxious or depressed appearing.  CNS: CN 2-12 intact, power,  normal throughout.no focal deficits noted.        Assessment & Plan:  Back pain Uncontrolled, localized back pain over left SI joint, non radiating,  toradol  And depo medrol in office  Hot flashes Ongoing c/o of hot flashes, saw cardiology earlier this year as there was some abn in EKG, cleared from a cardiac standpoint  No need to check female hormone levels at her age Lifestyle modification only, will recheck  SH , CBC and chem 7 to ensure no metabolic or infectious cause  Hypothyroidism C/o excess sweating retest TSH to ensure adeqaute control  GAD (generalized anxiety disorder) Excessive use of health care facility to discuss personal issues , pt would benefit from seeing therapist but remains resistant to the idea. She has no children. Her spouse has dementia, and trust issues as far as her personal property are becoming more of an issue. She does have excellent support from a sibling who she does trust, but who may be unaware of this underlying issue  Urinary frequency CCUA in office today is negative for infection, pt reassured that this is not connected to her symptoms

## 2014-02-15 NOTE — Assessment & Plan Note (Signed)
Ongoing c/o of hot flashes, saw cardiology earlier this year as there was some abn in EKG, cleared from a cardiac standpoint  No need to check female hormone levels at her age Lifestyle modification only, will recheck  SH , CBC and chem 7 to ensure no metabolic or infectious cause

## 2014-02-15 NOTE — Assessment & Plan Note (Signed)
C/o excess sweating retest TSH to ensure adeqaute control

## 2014-02-15 NOTE — Assessment & Plan Note (Signed)
Uncontrolled, localized back pain over left SI joint, non radiating, toradol  And depo medrol in office

## 2014-02-15 NOTE — Assessment & Plan Note (Signed)
CCUA in office today is negative for infection, pt reassured that this is not connected to her symptoms

## 2014-02-15 NOTE — Assessment & Plan Note (Signed)
Excessive use of health care facility to discuss personal issues , pt would benefit from seeing therapist but remains resistant to the idea. She has no children. Her spouse has dementia, and trust issues as far as her personal property are becoming more of an issue. She does have excellent support from a sibling who she does trust, but who may be unaware of this underlying issue

## 2014-02-17 NOTE — Addendum Note (Signed)
Addended by: Denman George B on: 02/17/2014 08:30 AM   Modules accepted: Orders

## 2014-02-18 ENCOUNTER — Encounter: Payer: Medicare Other | Admitting: Family Medicine

## 2014-03-04 DIAGNOSIS — H2513 Age-related nuclear cataract, bilateral: Secondary | ICD-10-CM | POA: Diagnosis not present

## 2014-03-20 ENCOUNTER — Ambulatory Visit (INDEPENDENT_AMBULATORY_CARE_PROVIDER_SITE_OTHER): Payer: Medicare Other

## 2014-03-20 ENCOUNTER — Telehealth: Payer: Self-pay

## 2014-03-20 VITALS — Temp 97.8°F | Wt 149.0 lb

## 2014-03-20 DIAGNOSIS — R109 Unspecified abdominal pain: Secondary | ICD-10-CM

## 2014-03-20 DIAGNOSIS — N3090 Cystitis, unspecified without hematuria: Secondary | ICD-10-CM | POA: Diagnosis not present

## 2014-03-20 LAB — POCT URINALYSIS DIPSTICK
Glucose, UA: NEGATIVE
KETONES UA: 15
Leukocytes, UA: NEGATIVE
Nitrite, UA: NEGATIVE
Protein, UA: 30
SPEC GRAV UA: 1.025
UROBILINOGEN UA: 0.2
pH, UA: 5.5

## 2014-03-20 NOTE — Progress Notes (Signed)
Left flank pain x 2 weeks, ua negative for infection and will send for culture due to hematuria.

## 2014-03-20 NOTE — Telephone Encounter (Signed)
Offer UA to r/o urine infection, appt next week see if can fit in the annual wellness she missed, and if pain still an issue at that time will address  Advise use of tylenol 325 mg twice daily for back pain if UA is normal please

## 2014-03-20 NOTE — Telephone Encounter (Signed)
Patient will come in this evening for a nurse visit.  She will receive appointment information then.

## 2014-03-22 LAB — URINE CULTURE
Colony Count: NO GROWTH
Organism ID, Bacteria: NO GROWTH

## 2014-03-25 ENCOUNTER — Other Ambulatory Visit: Payer: Self-pay | Admitting: Family Medicine

## 2014-03-27 ENCOUNTER — Ambulatory Visit (INDEPENDENT_AMBULATORY_CARE_PROVIDER_SITE_OTHER): Payer: Medicare Other | Admitting: Family Medicine

## 2014-03-27 ENCOUNTER — Encounter (INDEPENDENT_AMBULATORY_CARE_PROVIDER_SITE_OTHER): Payer: Self-pay

## 2014-03-27 ENCOUNTER — Encounter: Payer: Self-pay | Admitting: Family Medicine

## 2014-03-27 VITALS — BP 120/70 | HR 64 | Resp 18 | Ht 65.0 in | Wt 151.1 lb

## 2014-03-27 DIAGNOSIS — E038 Other specified hypothyroidism: Secondary | ICD-10-CM | POA: Diagnosis not present

## 2014-03-27 DIAGNOSIS — K219 Gastro-esophageal reflux disease without esophagitis: Secondary | ICD-10-CM

## 2014-03-27 DIAGNOSIS — E559 Vitamin D deficiency, unspecified: Secondary | ICD-10-CM | POA: Diagnosis not present

## 2014-03-27 DIAGNOSIS — F411 Generalized anxiety disorder: Secondary | ICD-10-CM

## 2014-03-27 DIAGNOSIS — G4700 Insomnia, unspecified: Secondary | ICD-10-CM | POA: Diagnosis not present

## 2014-03-27 NOTE — Patient Instructions (Addendum)
Annual wellness in  58month, please call if you need me before  TSH, vit D non fast in 3 month   Your blood pressure , ears and lungs and heart are excellent

## 2014-03-27 NOTE — Progress Notes (Signed)
   Subjective:    Patient ID: Rose [name], female    DOB: 11/01/1934, 79 y.o.   MRN: [id number]  HPI The PT is here for follow up and re-evaluation of chronic medical conditions, medication management and review of any available recent lab and radiology data.  Preventive health is updated, specifically  Cancer screening and Immunization.   Questions or concerns regarding consultations or procedures which the PT has had in the interim are  addressed. The PT denies any adverse reactions to current medications since the last visit.  C/o intermittent dry cough, no fever or chills, does also have mild allergy symptoms       Review of Systems See HPI Denies recent fever or chills. Denies sinus pressure, nasal congestion, ear pain or sore throat. Denies chest congestion, productive cough or wheezing. Denies chest pains, palpitations and leg swelling Denies abdominal pain, nausea, vomiting,diarrhea or constipation.   Denies dysuria, frequency, hesitancy or incontinence. Denies joint pain, swelling and limitation in mobility. Denies headaches, seizures, numbness, or tingling. Denies depression,uncontrolled  anxiety or insomnia. Denies skin break down or rash.        Objective:   Physical Exam BP 120/70 mmHg  Pulse 64  Resp 18  Ht 5\' 5"  (1.651 m)  Wt 151 lb 1.9 oz (68.548 kg)  BMI 25.15 kg/m2  SpO2 99% Patient alert and oriented and in no cardiopulmonary distress.  HEENT: No facial asymmetry, EOMI,   oropharynx pink and moist.  Neck supple no JVD, no mass. TM clear , no sinus tenderness Chest: Clear to auscultation bilaterally.  CVS: S1, S2 no murmurs, no S3.Regular rate.  ABD: Soft non tender.   Ext: No edema  MS: Adequate ROM spine, shoulders, hips and knees.  Skin: Intact, no ulcerations or rash noted.  Psych: Good eye contact, normal affect. Memory intact not anxious or depressed appearing.  CNS: CN 2-12 intact, power,  normal throughout.no focal deficits  noted.        Assessment & Plan:  Hypothyroidism Controlled, no change in medication    GAD (generalized anxiety disorder) Controlled, no change in medication Currently under increased stress due to the failing health of her spouse , but coping very well oveall   Insomnia Sleep hygiene reviewed and written information offered also. Prescription sent for  medication needed.    GERD (gastroesophageal reflux disease) Controlled, no change in medication    Allergic rhinitis Increased symptoms causing cough, no s/s of infection

## 2014-04-17 ENCOUNTER — Other Ambulatory Visit: Payer: Self-pay | Admitting: Family Medicine

## 2014-04-25 ENCOUNTER — Telehealth: Payer: Self-pay

## 2014-04-25 MED ORDER — DOXYCYCLINE HYCLATE 100 MG PO TABS: 100.0000 mg | ORAL_TABLET | Freq: Two times a day (BID) | ORAL | Status: DC

## 2014-04-25 MED ORDER — BENZONATATE 100 MG PO CAPS: 100.0000 mg | ORAL_CAPSULE | Freq: Two times a day (BID) | ORAL | Status: DC | PRN

## 2014-04-25 NOTE — Telephone Encounter (Signed)
I have sent in doxycycline and tessalon perles to her local pharmacy, pls verify correct and let her know to be careful on the highway, and hope her husband improves

## 2014-04-25 NOTE — Telephone Encounter (Signed)
States 2 days ago she started coughing, now producing yellow phlegm- sometimes large amounts. Also hoarse. Husband is in he hospital and she has been running with him. Had tried robitussin with no relief. Please advise

## 2014-04-25 NOTE — Telephone Encounter (Signed)
Pt aware.

## 2014-05-11 ENCOUNTER — Other Ambulatory Visit: Payer: Self-pay | Admitting: Family Medicine

## 2014-05-19 ENCOUNTER — Other Ambulatory Visit: Payer: Self-pay

## 2014-05-19 MED ORDER — PANTOPRAZOLE SODIUM 40 MG PO TBEC: 40.0000 mg | DELAYED_RELEASE_TABLET | Freq: Every day | ORAL | Status: DC

## 2014-05-20 ENCOUNTER — Other Ambulatory Visit: Payer: Self-pay

## 2014-06-09 ENCOUNTER — Telehealth: Payer: Self-pay | Admitting: Family Medicine

## 2014-06-09 MED ORDER — CIPROFLOXACIN HCL 500 MG PO TABS: 500.0000 mg | ORAL_TABLET | Freq: Two times a day (BID) | ORAL | Status: DC

## 2014-06-09 NOTE — Telephone Encounter (Signed)
Patient states that she has burning and frequency x 24 hours.  No fever of chills noted.  Med sent to pharmacy.

## 2014-06-09 NOTE — Addendum Note (Signed)
Addended by: Denman George B on: 06/09/2014 11:40 AM   Modules accepted: Orders

## 2014-06-09 NOTE — Telephone Encounter (Signed)
pls document her symptoms and send in cipro 500mg  one twice daily # 6 only

## 2014-06-09 NOTE — Telephone Encounter (Signed)
Please advise if anything can be sent or should patient attempt to reach urology

## 2014-06-19 ENCOUNTER — Other Ambulatory Visit: Payer: Self-pay

## 2014-06-19 ENCOUNTER — Ambulatory Visit (INDEPENDENT_AMBULATORY_CARE_PROVIDER_SITE_OTHER): Payer: Medicare Other | Admitting: Family Medicine

## 2014-06-19 ENCOUNTER — Encounter: Payer: Self-pay | Admitting: Family Medicine

## 2014-06-19 VITALS — BP 128/70 | HR 66 | Resp 16 | Ht 65.0 in | Wt 146.1 lb

## 2014-06-19 DIAGNOSIS — E559 Vitamin D deficiency, unspecified: Secondary | ICD-10-CM

## 2014-06-19 DIAGNOSIS — Z0000 Encounter for general adult medical examination without abnormal findings: Secondary | ICD-10-CM | POA: Diagnosis not present

## 2014-06-19 DIAGNOSIS — E038 Other specified hypothyroidism: Secondary | ICD-10-CM

## 2014-06-19 DIAGNOSIS — R350 Frequency of micturition: Secondary | ICD-10-CM | POA: Diagnosis not present

## 2014-06-19 LAB — POCT URINALYSIS DIPSTICK
Bilirubin, UA: NEGATIVE
GLUCOSE UA: NEGATIVE
Ketones, UA: NEGATIVE
Leukocytes, UA: NEGATIVE
NITRITE UA: NEGATIVE
Protein, UA: NEGATIVE
Urobilinogen, UA: 0.2
pH, UA: 6.5

## 2014-06-19 LAB — CBC
HEMATOCRIT: 41.1 % (ref 36.0–46.0)
Hemoglobin: 13.6 g/dL (ref 12.0–15.0)
MCH: 29.4 pg (ref 26.0–34.0)
MCHC: 33.1 g/dL (ref 30.0–36.0)
MCV: 88.8 fL (ref 78.0–100.0)
MPV: 10.5 fL (ref 8.6–12.4)
Platelets: 237 10*3/uL (ref 150–400)
RBC: 4.63 MIL/uL (ref 3.87–5.11)
RDW: 13.2 % (ref 11.5–15.5)
WBC: 4.7 10*3/uL (ref 4.0–10.5)

## 2014-06-19 LAB — BASIC METABOLIC PANEL
BUN: 10 mg/dL (ref 6–23)
CHLORIDE: 104 meq/L (ref 96–112)
CO2: 30 meq/L (ref 19–32)
Calcium: 9.4 mg/dL (ref 8.4–10.5)
Creat: 0.7 mg/dL (ref 0.50–1.10)
Glucose, Bld: 85 mg/dL (ref 70–99)
POTASSIUM: 4.1 meq/L (ref 3.5–5.3)
Sodium: 142 mEq/L (ref 135–145)

## 2014-06-19 MED ORDER — CLONAZEPAM 0.5 MG PO TABS: ORAL_TABLET | ORAL | Status: DC

## 2014-06-19 NOTE — Addendum Note (Signed)
Addended by: Denman George B on: 06/19/2014 01:22 PM   Modules accepted: Orders

## 2014-06-19 NOTE — Assessment & Plan Note (Signed)
CCUA in office is negative for infection 

## 2014-06-19 NOTE — Patient Instructions (Signed)
F/u in 3 month, call if you need me before   CBc, chem 7, TSh , vit D, today

## 2014-06-19 NOTE — Assessment & Plan Note (Signed)
Annual exam as documented. Counseling done  re healthy lifestyle involving commitment to 150 minutes exercise per week, heart healthy diet, and attaining healthy weight.The importance of adequate sleep also discussed. Regular seat belt use and home safety, is also discussed. Changes in health habits are decided on by the patient with goals and time frames  set for achieving them. Immunization and cancer screening needs are specifically addressed at this visit. Definite concern re increased stress of her spouses' prolonged illness with deterioration in his health and the effect that it is taking on her mentally, emotionally and physically. Support from her sister [name] is solicited, she voluntarily is present at the [facility] out of concern, hopefully, Valyncia will make changes like allowing Ivinia to manage her finances, as in paying bills on time, also she will stop washing her spouse's clothing, and allow facility to do this

## 2014-06-19 NOTE — Progress Notes (Signed)
Subjective:    Patient ID: Rose Rose [name], female    DOB: 1935/01/02, 80 y.o.   MRN: [id number]  HPI Preventive Screening-Counseling & Management   Patient present here today for a Medicare  wellness visit.   Current Problems (verified)   Medications Prior to Visit Allergies (verified)   PAST HISTORY  Family History (verified)   Social History Married for 61 years, no children, retired from [name] Never alcohol or drug use   Risk Factors  Current exercise habits:  Exercises at home a few days a week and then she tries to walk daily  Dietary issues discussed: Heart healthy diet discussed, eating more fruits and vegetables and limiting fried fatty foods and red meat    Cardiac risk factors: Father deceased at age 31 from MI  Depression Screen  (Note: if answer to either of the following is "Yes", a more complete depression screening is indicated)   Over the past two weeks, have you felt down, depressed or hopeless? No  Over the past two weeks, have you felt little interest or pleasure in doing things? No  Have you lost interest or pleasure in daily life? No  Do you often feel hopeless? No  Do you cry easily over simple problems? No , however when pt absent from room, Rose Rose [name] her sister states that Rose Rose [name] will call late in the night,  Crying and overwhelmed, probably about a bill to pay Also noted to be irritable and short tempered in recent times, again linked to stress with her spouse's illness  Activities of Daily Living  In your present state of health, do you have any difficulty performing the following activities?  Driving?: No Managing money?: No Feeding yourself?:No Getting from bed to chair?:No Climbing a flight of stairs?:No Preparing food and eating?:No Bathing or showering?:No Getting dressed?:No Getting to the toilet?:No Using the toilet?:No Moving around from place to place?: No  Fall Risk Assessment In the past year have you fallen or  had a near fall?:No Are you currently taking any medications that make you dizzy?:No   Hearing Difficulties: No Do you often ask people to speak up or repeat themselves?:No Do you experience ringing or noises in your ears?:No Do you have difficulty understanding soft or whispered voices?:No  Cognitive Testing  Alert? Yes Normal Appearance?Yes  Oriented to person? Yes Place? Yes  Time? Yes  Displays appropriate judgment?Yes  Can read the correct time from a watch face? yes Are you having problems remembering things?No,,however , sister states that she has been recently double paying a utility bill, she is to try to set ap a regular day of the week when they sort out bills togetheer  Advanced Directives have been discussed with the patient?Yes, Rose [name] given , no living will discussion at visit.Rose Rose [name] was agitated as hher sister accompanied her and the visit was dominated by her sister's  (Rose Rose [name])legitimate concern that Rose Rose [name] is over stretching herself caring for her spouse in a SNF, and is decompensating physically, emotionally and mentally Rose Rose [name] does not agree with this view point   List the Names of Other Physician/Practitioners you currently use:  Rose Rose [name] (cardio)  Rose Rose [name] (Ortho)  Indicate any recent Medical Services you may have received from other than [facility] providers in the past year (date may be approximate).   Assessment:    Annual Wellness Exam   Plan:     Medicare Attestation  I have personally reviewed:  The patient's medical and social history  Their use  of alcohol, tobacco or illicit drugs  Their current medications and supplements  The patient's functional ability including ADLs,fall risks, home safety risks, cognitive, and hearing and visual impairment  Diet and physical activities  Evidence for depression or mood disorders  The patient's weight, height, BMI, and visual acuity have been recorded in the chart. I have made referrals, counseling,  and provided education to the patient based on review of the above and I have provided the patient with a written personalized care plan for preventive services.      Review of Systems     Objective:   Physical Exam  BP 128/70 mmHg  Pulse 66  Resp 16  Ht 5\' 5"  (1.651 m)  Wt 146 lb 1.9 oz (66.28 kg)  BMI 24.32 kg/m2  SpO2 98% Pt is agitated, and not particularly welcoming of the expressed concerns of her sister Rose Rose [name]      Assessment & Plan:  Medicare annual wellness visit, subsequent Annual exam as documented. Counseling done  re healthy lifestyle involving commitment to 150 minutes exercise per week, heart healthy diet, and attaining healthy weight.The importance of adequate sleep also discussed. Regular seat belt use and home safety, is also discussed. Changes in health habits are decided on by the patient with goals and time frames  set for achieving them. Immunization and cancer screening needs are specifically addressed at this visit. Definite concern re increased stress of her spouses' prolonged illness with deterioration in his health and the effect that it is taking on her mentally, emotionally and physically. Support from her sister Rose Rose [name] is solicited, she voluntarily is present at the Rose Rose [name] out of concern, hopefully, Rose Rose [name] will make changes like allowing Rose Rose [name] to manage her finances, as in paying bills on time, also she will stop washing her spouse's clothing, and allow facility to do this

## 2014-06-20 ENCOUNTER — Telehealth: Payer: Self-pay

## 2014-06-20 DIAGNOSIS — R350 Frequency of micturition: Secondary | ICD-10-CM

## 2014-06-20 LAB — TSH: TSH: 1.865 u[IU]/mL (ref 0.350–4.500)

## 2014-06-20 LAB — VITAMIN D 25 HYDROXY (VIT D DEFICIENCY, FRACTURES): VIT D 25 HYDROXY: 17 ng/mL — AB (ref 30–100)

## 2014-06-20 NOTE — Telephone Encounter (Signed)
Message left that i will call back

## 2014-06-21 MED ORDER — SOLIFENACIN SUCCINATE 5 MG PO TABS: 5.0000 mg | ORAL_TABLET | Freq: Every day | ORAL | Status: DC

## 2014-06-21 NOTE — Telephone Encounter (Signed)
Pls order urine for c/s ONLY.She was requesting f/u appt for urine symptom, if culture is negative and no improvement with vesicare will refer to urology, not sure if she will see someone in [place] or in [place], there was recent confusion about this, she went back and forth so IF we get there, we will need to establish where she wants to be seen I spoke with pt on 02/06/ 2016, still c/o urinary frequency I have sent in vesicare for her to start taking, check if she did start and if any symptom improvement  ??pls ask

## 2014-06-21 NOTE — Addendum Note (Signed)
Addended by: Fayrene Helper on: 06/21/2014 09:57 AM   Modules accepted: Orders

## 2014-06-21 NOTE — Telephone Encounter (Signed)
I spoke directly with the pt. Stated that sh was very upset with the fact that her sister [name] was at the visit recently. Feels that she is "trying to take over everything" Is willing to have her help, but she genuinely stated that she felt betrayed, barely slept last night , continues to have frequency, wants to arrange f/u to check urine  Will send in vesicare for OAB, and have urine specimen sent for c/s only

## 2014-06-24 DIAGNOSIS — R350 Frequency of micturition: Secondary | ICD-10-CM | POA: Diagnosis not present

## 2014-06-24 NOTE — Telephone Encounter (Signed)
Called and left message for patient to return call.  

## 2014-06-24 NOTE — Addendum Note (Signed)
Addended by: Denman George B on: 06/24/2014 04:40 PM   Modules accepted: Orders

## 2014-06-24 NOTE — Telephone Encounter (Signed)
Patient aware and will come by office to leave urine specimen

## 2014-06-27 LAB — URINE CULTURE

## 2014-07-15 ENCOUNTER — Ambulatory Visit: Payer: Medicare Other | Admitting: Orthopedic Surgery

## 2014-07-21 ENCOUNTER — Ambulatory Visit (INDEPENDENT_AMBULATORY_CARE_PROVIDER_SITE_OTHER): Payer: Medicare Other | Admitting: Orthopedic Surgery

## 2014-07-21 ENCOUNTER — Encounter: Payer: Self-pay | Admitting: Orthopedic Surgery

## 2014-07-21 VITALS — BP 145/75 | Ht 65.0 in | Wt 146.1 lb

## 2014-07-21 DIAGNOSIS — M1712 Unilateral primary osteoarthritis, left knee: Secondary | ICD-10-CM | POA: Diagnosis not present

## 2014-07-21 NOTE — Progress Notes (Signed)
Chief Complaint  Patient presents with  . Knee Pain    left knee pain, requests injection    The patient did well with her last injection would like another injection.  No signs of infection or inflammation in the left knee   Procedure note left knee injection verbal consent was obtained to inject left knee joint  Timeout was completed to confirm the site of injection  The medications used were 40 mg of Depo-Medrol and 1% lidocaine 3 cc  Anesthesia was provided by ethyl chloride and the skin was prepped with alcohol.  After cleaning the skin with alcohol a 20-gauge needle was used to inject the left knee joint. There were no complications. A sterile bandage was applied.

## 2014-08-13 ENCOUNTER — Other Ambulatory Visit: Payer: Self-pay | Admitting: Family Medicine

## 2014-08-13 DIAGNOSIS — Z1231 Encounter for screening mammogram for malignant neoplasm of breast: Secondary | ICD-10-CM

## 2014-08-17 NOTE — Assessment & Plan Note (Signed)
Controlled, no change in medication Currently under increased stress due to the failing health of her spouse , but coping very well oveall

## 2014-08-17 NOTE — Assessment & Plan Note (Signed)
Sleep hygiene reviewed and written information offered also. Prescription sent for  medication needed.  

## 2014-08-17 NOTE — Assessment & Plan Note (Signed)
Increased symptoms causing cough, no s/s of infection

## 2014-08-17 NOTE — Assessment & Plan Note (Signed)
Controlled, no change in medication  

## 2014-09-11 ENCOUNTER — Other Ambulatory Visit: Payer: Self-pay | Admitting: Family Medicine

## 2014-09-11 DIAGNOSIS — Z1231 Encounter for screening mammogram for malignant neoplasm of breast: Secondary | ICD-10-CM

## 2014-09-13 ENCOUNTER — Other Ambulatory Visit: Payer: Self-pay | Admitting: Family Medicine

## 2014-09-22 ENCOUNTER — Ambulatory Visit (HOSPITAL_COMMUNITY)
Admission: RE | Admit: 2014-09-22 | Discharge: 2014-09-22 | Disposition: A | Discharge status: Home / Self Care | Payer: Medicare Other | Source: Ambulatory Visit | Attending: Family Medicine | Admitting: Family Medicine

## 2014-09-22 ENCOUNTER — Encounter (HOSPITAL_COMMUNITY): Payer: Medicare Other

## 2014-09-22 ENCOUNTER — Ambulatory Visit (HOSPITAL_COMMUNITY): Payer: Medicare Other

## 2014-09-22 DIAGNOSIS — Z1231 Encounter for screening mammogram for malignant neoplasm of breast: Secondary | ICD-10-CM | POA: Diagnosis not present

## 2014-09-23 ENCOUNTER — Encounter: Payer: Self-pay | Admitting: Family Medicine

## 2014-09-23 ENCOUNTER — Ambulatory Visit (INDEPENDENT_AMBULATORY_CARE_PROVIDER_SITE_OTHER): Payer: Medicare Other | Admitting: Family Medicine

## 2014-09-23 VITALS — BP 150/80 | HR 92 | Resp 16 | Ht 65.0 in | Wt 145.0 lb

## 2014-09-23 DIAGNOSIS — F411 Generalized anxiety disorder: Secondary | ICD-10-CM

## 2014-10-16 ENCOUNTER — Other Ambulatory Visit: Payer: Self-pay | Admitting: Family Medicine

## 2014-10-16 ENCOUNTER — Ambulatory Visit (INDEPENDENT_AMBULATORY_CARE_PROVIDER_SITE_OTHER): Payer: Medicare Other

## 2014-10-16 VITALS — BP 122/78 | Wt 143.0 lb

## 2014-10-16 DIAGNOSIS — N3000 Acute cystitis without hematuria: Secondary | ICD-10-CM | POA: Diagnosis not present

## 2014-10-16 LAB — POCT URINALYSIS DIPSTICK
BILIRUBIN UA: NEGATIVE
Glucose, UA: NEGATIVE
KETONES UA: NEGATIVE
Nitrite, UA: NEGATIVE
Protein, UA: NEGATIVE
SPEC GRAV UA: 1.02
Urobilinogen, UA: 0.2
pH, UA: 7

## 2014-10-16 MED ORDER — CIPROFLOXACIN HCL 500 MG PO TABS: 500.0000 mg | ORAL_TABLET | Freq: Two times a day (BID) | ORAL | Status: DC

## 2014-10-16 NOTE — Progress Notes (Signed)
UA trace leukocytes so send for culture and 3 days of cipro 500 prescribed per Dr.

## 2014-10-17 LAB — URINE CULTURE
Colony Count: NO GROWTH
Organism ID, Bacteria: NO GROWTH

## 2014-10-23 ENCOUNTER — Ambulatory Visit (INDEPENDENT_AMBULATORY_CARE_PROVIDER_SITE_OTHER): Payer: Medicare Other | Admitting: Orthopedic Surgery

## 2014-10-23 ENCOUNTER — Ambulatory Visit (INDEPENDENT_AMBULATORY_CARE_PROVIDER_SITE_OTHER): Payer: Medicare Other

## 2014-10-23 VITALS — BP 109/53 | Ht 65.0 in | Wt 143.0 lb

## 2014-10-23 DIAGNOSIS — M25562 Pain in left knee: Secondary | ICD-10-CM

## 2014-10-23 DIAGNOSIS — M1712 Unilateral primary osteoarthritis, left knee: Secondary | ICD-10-CM | POA: Diagnosis not present

## 2014-10-23 NOTE — Progress Notes (Signed)
Patient ID: Rose [name], female   DOB: November 05, 1934, 80 y.o.   MRN: [id number]  Chief Complaint  Patient presents with  . Follow-up    yearly follow up + xray Left knee,    HPI Rose [name] is a 80 y.o. female.  Comes in for a follow-up visit. We are following her yearly with x-rays were severely arthritic and valgus knee.  She has mild discomfort in her knee but performs all activities of daily living including shopping, chores around the house and cleaning, laundry.  Review of systems social history patient's husband died 2-1/2 weeks ago but she exhibits no sign of depression  No other symptoms.   Past Medical History  Diagnosis Date  . Dyslipidemia   . Chronic anxiety   . Hypothyroidism   . Palpitations     Frequent PACs by Holter monitor June 2011    Past Surgical History  Procedure Laterality Date  . Appendectomy    . Cholecystectomy    . Partial hysterectomy    . Colonoscopy  09/08/2011    Procedure: COLONOSCOPY;  Surgeon: Rogene Houston, MD;  Location: [facility];  Service: Endoscopy;  Laterality: N/A;  2:10  . Abdominal hysterectomy       Allergies  Allergen Reactions  . Penicillins     Current Outpatient Prescriptions  Medication Sig Dispense Refill  . aspirin (BAYER ASPIRIN EC LOW DOSE) 81 MG EC tablet Take 81 mg by mouth daily.      . benzonatate (TESSALON) 100 MG capsule Take 1 capsule (100 mg total) by mouth 2 (two) times daily as needed for cough. 20 capsule 0  . busPIRone (BUSPAR) 7.5 MG tablet TAKE 1 TABLET TWICE DAILY 60 tablet 4  . Calcium 500-125 MG-UNIT TABS Take by mouth 3 (three) times daily.      . ciprofloxacin (CIPRO) 500 MG tablet Take 1 tablet (500 mg total) by mouth 2 (two) times daily. 6 tablet 0  . citalopram (CELEXA) 10 MG tablet TAKE 1 TABLET BY MOUTH EVERY DAY 30 tablet 4  . clonazePAM (KLONOPIN) 0.5 MG tablet TAKE 1 TABLET BY MOUTH TWICE A DAY AS NEEDED FOR ANXIETY 30 tablet 1  . ergocalciferol (VITAMIN D2) 50000 UNITS  capsule Take 1 capsule (50,000 Units total) by mouth once a week. One capsule once weekly (Patient not taking: Reported on 06/19/2014) 12 capsule 1  . levothyroxine (SYNTHROID, LEVOTHROID) 25 MCG tablet TAKE 1 TABLET BY MOUTH EVERY DAY 30 tablet 4  . Multiple Vitamin (MULTIVITAMIN PO) Take by mouth daily.      . pantoprazole (PROTONIX) 40 MG tablet TAKE 1 TABLET (40 MG TOTAL) BY MOUTH DAILY. 30 tablet 4  . solifenacin (VESICARE) 5 MG tablet Take 1 tablet (5 mg total) by mouth daily. 30 tablet 3  . traMADol-acetaminophen (ULTRACET) 37.5-325 MG per tablet Take 1 tablet by mouth every 4 (four) hours as needed. 84 tablet 0   No current facility-administered medications for this visit.    Review of Systems Review of Systems    Physical Exam Blood pressure 109/53, height 5\' 5"  (1.651 m), weight 143 lb (64.864 kg). Physical Exam  Constitutional: She is oriented to person, place, and time. She appears well-developed and well-nourished. No distress.  Musculoskeletal:       Left knee: She exhibits decreased range of motion and abnormal alignment. She exhibits no swelling, no effusion, no ecchymosis and no deformity. No tenderness found.  Range of motion is now about 115. Her knee remained stable  and the motor exam is normal    Neurological: She is alert and oriented to person, place, and time. She has normal reflexes.  Skin: Skin is warm. She is not diaphoretic.  Psychiatric: She has a normal mood and affect. Her behavior is normal. Judgment and thought content normal.  Vitals reviewed.   Data Reviewed X-ray of the knee ordered and independently interpreted as severe valgus arthritis of the knee with a 16 valgus deformity  Assessment    She is relatively asymptomatic and we will continue nonoperative treatment    Plan    X-ray will be done in one year or she will come in sooner if her pain increases her activity level decreases       Arther Abbott 10/23/2014, 11:31 AM

## 2014-10-26 NOTE — Progress Notes (Signed)
   Subjective:    Patient ID: Rose [name], female    DOB: 1935-02-12, 80 y.o.   MRN: [id number]  HPI Pt was not evaluated clinically at this visit , as her husband who had been ill for some time was at a terminal stage She is to return for follow up and states she will call when able   Review of Systems     Objective:   Physical Exam        Assessment & Plan:

## 2014-11-11 ENCOUNTER — Ambulatory Visit: Payer: Medicare Other | Admitting: Family Medicine

## 2014-11-19 ENCOUNTER — Ambulatory Visit (INDEPENDENT_AMBULATORY_CARE_PROVIDER_SITE_OTHER): Payer: Medicare Other | Admitting: Family Medicine

## 2014-11-19 ENCOUNTER — Encounter: Payer: Self-pay | Admitting: Family Medicine

## 2014-11-19 VITALS — BP 124/82 | HR 68 | Resp 18 | Ht 65.0 in | Wt 143.0 lb

## 2014-11-19 DIAGNOSIS — E559 Vitamin D deficiency, unspecified: Secondary | ICD-10-CM

## 2014-11-19 DIAGNOSIS — Z0000 Encounter for general adult medical examination without abnormal findings: Secondary | ICD-10-CM | POA: Diagnosis not present

## 2014-11-19 DIAGNOSIS — M8580 Other specified disorders of bone density and structure, unspecified site: Secondary | ICD-10-CM

## 2014-11-19 DIAGNOSIS — E785 Hyperlipidemia, unspecified: Secondary | ICD-10-CM

## 2014-11-19 DIAGNOSIS — Z1211 Encounter for screening for malignant neoplasm of colon: Secondary | ICD-10-CM

## 2014-11-19 DIAGNOSIS — E038 Other specified hypothyroidism: Secondary | ICD-10-CM

## 2014-11-19 DIAGNOSIS — Z13820 Encounter for screening for osteoporosis: Secondary | ICD-10-CM

## 2014-11-19 LAB — HEMOCCULT GUIAC POC 1CARD (OFFICE): Fecal Occult Blood, POC: NEGATIVE

## 2014-11-19 MED ORDER — ERGOCALCIFEROL 1.25 MG (50000 UT) PO CAPS: 50000.0000 [IU] | ORAL_CAPSULE | ORAL | Status: DC

## 2014-11-19 NOTE — Patient Instructions (Addendum)
F/u in 3 month, call if you need me before  Please get fasting lipid, TSH in 3 month  Please start once  weekly vitamin D , this is sent to your pharmacy.  You are referred for bone density test

## 2014-11-19 NOTE — Assessment & Plan Note (Addendum)
Annual exam as documented. Counseling done  re healthy lifestyle involving commitment to 150 minutes exercise per week, heart healthy diet, and attaining healthy weight.The importance of adequate sleep also discussed. Regular seat belt use and home safety, is also discussed. Cancer screening and referral for bone density done at visit

## 2014-11-19 NOTE — Progress Notes (Signed)
   Subjective:    Patient ID: Rose [name], female    DOB: 14-Sep-1934, 80 y.o.   MRN: [id number]  HPI Patient is in for annual physical exam. No other health concerns are expressed or addressed at the visit.Pt states she is doing as well as to be expected following her husband's passing, states she sleeps well, and has more than enough to eat, her weight is stable. Expresses concerns that her "family is interfering in her money" and that is what annoys her, also some of her nephews are asking about vehicles that her deceased spouse once drove Recent labs, if available are reviewed. Immunization is reviewed , no  update needed.    Review of Systems    See HPI  Objective:   Physical Exam  BP 124/82 mmHg  Pulse 68  Resp 18  Ht 5\' 5"  (1.651 m)  Wt 143 lb (64.864 kg)  BMI 23.80 kg/m2  SpO2 98% Pleasant well nourished female, alert and oriented x 3, in no cardio-pulmonary distress. Afebrile. HEENT No facial trauma or asymetry. Sinuses non tender.  Extra occullar muscles intact, pupils equally reactive to light. External ears normal, tympanic membranes clear. Oropharynx moist, no exudate fairly , good dentition. Neck: supple, no adenopathy,JVD or thyromegaly.No bruits.  Chest: Clear to ascultation bilaterally.No crackles or wheezes. Non tender to palpation  Breast: No asymetry,no masses or lumps. No tenderness. No nipple discharge or inversion. No axillary or supraclavicular adenopathy  Cardiovascular system; Heart sounds normal,  S1 and  S2 ,no S3.  No murmur, or thrill. Apical beat not displaced Peripheral pulses normal.  Abdomen: Soft, non tender, no organomegaly or masses. No bruits. Bowel sounds normal. No guarding, tenderness or rebound.  Rectal:  Normal sphincter tone. No mass.No rectal masses.  Guaiac negative stool.  GU: External genitalia normal female genitalia , female distribution of hair. No lesions. Urethral meatus normal in size, no  Prolapse,  no lesions visibly  Present. Bladder non tender. Vagina pink and moist , with no visible lesions , discharge present . Adequate pelvic support no  cystocele or rectocele noted  Uterusabsent no adnexal masses, no  adnexal tenderness.   Musculoskeletal exam: Full ROM of spine, hips , shoulders and knees. No deformity ,swelling or crepitus noted. No muscle wasting or atrophy.   Neurologic: Cranial nerves 2 to 12 intact. Power, tone ,sensation and reflexes normal throughout. No disturbance in gait. No tremor.  Skin: Intact, no ulceration, erythema , scaling or rash noted. Pigmentation normal throughout  Psych; Normal mood and affect. Judgement and concentration normal       Assessment & Plan:  Annual physical exam Annual exam as documented. Counseling done  re healthy lifestyle involving commitment to 150 minutes exercise per week, heart healthy diet, and attaining healthy weight.The importance of adequate sleep also discussed. Regular seat belt use and home safety, is also discussed. Cancer screening and referral for bone density done at visit

## 2014-11-26 ENCOUNTER — Ambulatory Visit (HOSPITAL_COMMUNITY)
Admission: RE | Admit: 2014-11-26 | Discharge: 2014-11-26 | Disposition: A | Discharge status: Home / Self Care | Payer: Medicare Other | Source: Ambulatory Visit | Attending: Family Medicine | Admitting: Family Medicine

## 2014-11-26 DIAGNOSIS — Z780 Asymptomatic menopausal state: Secondary | ICD-10-CM | POA: Insufficient documentation

## 2014-11-26 DIAGNOSIS — M810 Age-related osteoporosis without current pathological fracture: Secondary | ICD-10-CM | POA: Diagnosis not present

## 2014-11-26 DIAGNOSIS — M8580 Other specified disorders of bone density and structure, unspecified site: Secondary | ICD-10-CM | POA: Insufficient documentation

## 2014-11-26 DIAGNOSIS — Z13820 Encounter for screening for osteoporosis: Secondary | ICD-10-CM | POA: Insufficient documentation

## 2014-11-28 ENCOUNTER — Telehealth: Payer: Self-pay

## 2014-11-28 MED ORDER — ALENDRONATE SODIUM 70 MG PO TABS: 70.0000 mg | ORAL_TABLET | ORAL | Status: DC

## 2014-11-28 NOTE — Telephone Encounter (Signed)
-----   Message from Fayrene Helper, MD sent at 11/28/2014 10:28 AM EDT ----- Please let patient know that she has osteoporosis, which means that her bones have thinned a lot over the years, are not as strong as before, and she is therefore at an increased risk of a fracture, with or without falling. Apart from calcium and vit D supplements daily and   regular weight bearing  physical activity , it is recommended that she starts once weekly medication to strengthen her bones. Please send in whichever drug is covered, either actonel 35 mg one tablet once weekly #4 refill 11 , or fosamax 70 mg once weekly #4  refill  11 and let her know, once she agrees to start the medication. If already on bone builder needs to continue this, and she will have repeat bone density test in 2 to 3 years.

## 2015-01-07 DIAGNOSIS — Z23 Encounter for immunization: Secondary | ICD-10-CM | POA: Diagnosis not present

## 2015-01-15 DIAGNOSIS — E038 Other specified hypothyroidism: Secondary | ICD-10-CM | POA: Diagnosis not present

## 2015-01-15 DIAGNOSIS — E785 Hyperlipidemia, unspecified: Secondary | ICD-10-CM | POA: Diagnosis not present

## 2015-01-16 LAB — LIPID PANEL
Cholesterol: 230 mg/dL — ABNORMAL HIGH (ref 125–200)
HDL: 98 mg/dL (ref >=46)
LDL Cholesterol: 115 mg/dL (ref <130)
Total CHOL/HDL Ratio: 2.3 Ratio (ref <=5.0)
Triglycerides: 84 mg/dL (ref <150)
VLDL: 17 mg/dL (ref <30)

## 2015-01-16 LAB — TSH: TSH: 2.444 u[IU]/mL (ref 0.350–4.500)

## 2015-02-03 ENCOUNTER — Ambulatory Visit: Payer: Medicare Other | Admitting: Orthopedic Surgery

## 2015-02-10 ENCOUNTER — Ambulatory Visit (INDEPENDENT_AMBULATORY_CARE_PROVIDER_SITE_OTHER): Payer: Medicare Other | Admitting: Orthopedic Surgery

## 2015-02-10 ENCOUNTER — Ambulatory Visit: Payer: Medicare Other | Admitting: Family Medicine

## 2015-02-10 VITALS — BP 134/86 | Ht 65.0 in | Wt 142.0 lb

## 2015-02-10 DIAGNOSIS — M1712 Unilateral primary osteoarthritis, left knee: Secondary | ICD-10-CM

## 2015-02-10 NOTE — Patient Instructions (Signed)
Joint Injection  Care After  Refer to this sheet in the next few days. These instructions provide you with information on caring for yourself after you have had a joint injection. Your caregiver also may give you more specific instructions. Your treatment has been planned according to current medical practices, but problems sometimes occur. Call your caregiver if you have any problems or questions after your procedure.  After any type of joint injection, it is not uncommon to experience:  · Soreness, swelling, or bruising around the injection site.  · Mild numbness, tingling, or weakness around the injection site caused by the numbing medicine used before or with the injection.  It also is possible to experience the following effects associated with the specific agent after injection:  · Iodine-based contrast agents:  ¨ Allergic reaction (itching, hives, widespread redness, and swelling beyond the injection site).  · Corticosteroids (These effects are rare.):  ¨ Allergic reaction.  ¨ Increased blood sugar levels (If you have diabetes and you notice that your blood sugar levels have increased, notify your caregiver).  ¨ Increased blood pressure levels.  ¨ Mood swings.  · Hyaluronic acid in the use of viscosupplementation.  ¨ Temporary heat or redness.  ¨ Temporary rash and itching.  ¨ Increased fluid accumulation in the injected joint.  These effects all should resolve within a day after your procedure.   HOME CARE INSTRUCTIONS  · Limit yourself to light activity the day of your procedure. Avoid lifting heavy objects, bending, stooping, or twisting.  · Take prescription or over-the-counter pain medication as directed by your caregiver.  · You may apply ice to your injection site to reduce pain and swelling the day of your procedure. Ice may be applied 03-04 times:  ¨ Put ice in a plastic bag.  ¨ Place a towel between your skin and the bag.  ¨ Leave the ice on for no longer than 15-20 minutes each time.  SEEK  IMMEDIATE MEDICAL CARE IF:   · Pain and swelling get worse rather than better or extend beyond the injection site.  · Numbness does not go away.  · Blood or fluid continues to leak from the injection site.  · You have chest pain.  · You have swelling of your face or tongue.  · You have trouble breathing or you become dizzy.  · You develop a fever, chills, or severe tenderness at the injection site that last longer than 1 day.  MAKE SURE YOU:  · Understand these instructions.  · Watch your condition.  · Get help right away if you are not doing well or if you get worse.  Document Released: 01/13/2011 Document Revised: 07/25/2011 Document Reviewed: 01/13/2011  [employer]® Patient Information ©2015 [employer]. This information is not intended to replace advice given to you by your health care provider. Make sure you discuss any questions you have with your health care provider.

## 2015-02-10 NOTE — Progress Notes (Signed)
Patient ID: Rose [name], female   DOB: 04/13/1935, 80 y.o.   MRN: [id number]  Follow up visit  Chief Complaint  Patient presents with  . Follow-up    follow up left knee    Ht 5\' 5"  (1.651 m)  No diagnosis found.  Pain left knee requests injection   Procedure note left knee injection verbal consent was obtained to inject left knee joint  Timeout was completed to confirm the site of injection  The medications used were 40 mg of Depo-Medrol and 1% lidocaine 3 cc  Anesthesia was provided by ethyl chloride and the skin was prepped with alcohol.  After cleaning the skin with alcohol a 20-gauge needle was used to inject the left knee joint. There were no complications. A sterile bandage was applied.

## 2015-02-19 ENCOUNTER — Ambulatory Visit: Payer: Medicare Other | Admitting: Family Medicine

## 2015-04-02 ENCOUNTER — Other Ambulatory Visit: Payer: Self-pay

## 2015-04-02 MED ORDER — ERGOCALCIFEROL 1.25 MG (50000 UT) PO CAPS: 50000.0000 [IU] | ORAL_CAPSULE | ORAL | Status: DC

## 2015-04-12 ENCOUNTER — Other Ambulatory Visit: Payer: Self-pay | Admitting: Family Medicine

## 2015-04-13 ENCOUNTER — Other Ambulatory Visit: Payer: Self-pay | Admitting: Family Medicine

## 2015-04-15 ENCOUNTER — Ambulatory Visit (INDEPENDENT_AMBULATORY_CARE_PROVIDER_SITE_OTHER): Payer: Medicare Other | Admitting: Family Medicine

## 2015-04-15 ENCOUNTER — Encounter: Payer: Self-pay | Admitting: Family Medicine

## 2015-04-15 ENCOUNTER — Other Ambulatory Visit: Payer: Self-pay | Admitting: Family Medicine

## 2015-04-15 VITALS — BP 126/80 | HR 64 | Resp 18 | Ht 65.0 in | Wt 144.0 lb

## 2015-04-15 DIAGNOSIS — K219 Gastro-esophageal reflux disease without esophagitis: Secondary | ICD-10-CM | POA: Diagnosis not present

## 2015-04-15 DIAGNOSIS — E038 Other specified hypothyroidism: Secondary | ICD-10-CM

## 2015-04-15 DIAGNOSIS — E559 Vitamin D deficiency, unspecified: Secondary | ICD-10-CM | POA: Diagnosis not present

## 2015-04-15 DIAGNOSIS — F411 Generalized anxiety disorder: Secondary | ICD-10-CM

## 2015-04-15 DIAGNOSIS — F32A Depression, unspecified: Secondary | ICD-10-CM

## 2015-04-15 DIAGNOSIS — F329 Major depressive disorder, single episode, unspecified: Secondary | ICD-10-CM

## 2015-04-15 NOTE — Progress Notes (Signed)
   Subjective:    Patient ID: Rose [name], female    DOB: Mar 24, 1935, 80 y.o.   MRN: [id number]  HPI   Rose [name]     MRN: [id number]      DOB: [birth year]   HPI [name] is here for follow up and re-evaluation of chronic medical conditions, medication management and review of any available recent lab and radiology data.  Preventive health is updated, specifically  Cancer screening and Immunization.   The PT inadvertently stopped all medications for past approx 4 months, as noted by med bottles she brought with her Continues to express aggravation with and lack of trust as far as family members are concerned with loss of spouse Wants to "get back on her meds" which she states that she needs  ROS Denies recent fever or chills. Denies sinus pressure, nasal congestion, ear pain or sore throat. Denies chest congestion, productive cough or wheezing. Denies chest pains, palpitations and leg swelling Denies , vomiting,diarrhea or constipation.   Denies dysuria, frequency, hesitancy or incontinence. Denies joint pain, swelling and limitation in mobility. Denies headaches, seizures, numbness, or tingling. . Denies skin break down or rash.   PE  BP 126/80 mmHg  Pulse 64  Resp 18  Ht 5\' 5"  (1.651 m)  Wt 144 lb (65.318 kg)  BMI 23.96 kg/m2  SpO2 99%  Patient alert and oriented and in no cardiopulmonary distress.  HEENT: No facial asymmetry, EOMI,   oropharynx pink and moist.  Neck supple no JVD, no mass.  Chest: Clear to auscultation bilaterally.  CVS: S1, S2 no murmurs, no S3.Regular rate.  ABD: Soft non tender.   Ext: No edema  MS: Adequate ROM spine, shoulders, hips and knees.  Skin: Intact, no ulcerations or rash noted.  Psych: Good eye contact, normal affect. Memory intact  anxious , mildly  depressed appearing.  CNS: CN 2-12 intact, power,  normal throughout.no focal deficits noted.   Assessment & Plan  Hypothyroidism Untreated,, pt stropped  medications over past several months, now ready to resume, she has been somewhat confused since loss of her spouse, and repeatedly states that family is "interfering " with her  GERD (gastroesophageal reflux disease) Repots being symptomatic off of medication, will resume medication same is sent in  GAD (generalized anxiety disorder) Uncontrolled off of medication, increased paranoia is expressed, will resume citalopram, same prescribed  Depression Resumption of citalopram will treat this, she is neither suicidal or homicidal       Review of Systems     Objective:   Physical Exam        Assessment & Plan:

## 2015-04-15 NOTE — Patient Instructions (Addendum)
F/u in 2 month, call if you need me before  THRE medication daily, levothyroxine, , citalopram and pantoprazole  Thanks for choosing [facility], we consider it a privelige to serve you. TSH and vit D, and CBC in 2 month

## 2015-05-28 ENCOUNTER — Telehealth: Payer: Self-pay | Admitting: Family Medicine

## 2015-05-28 NOTE — Telephone Encounter (Signed)
Ms. Luma is on the answering machine stating that our office called her and told her to go to the pharmacy and pick up some medication and when she got there she said it was $300.00 and she wants to know what it is for, please advise?

## 2015-05-29 MED ORDER — OXYBUTYNIN CHLORIDE 5 MG PO TABS: ORAL_TABLET | ORAL | Status: DC

## 2015-05-29 NOTE — Telephone Encounter (Signed)
Her vesicare is $200. Wants it changed to something cheaper and sent to [facility]. Maybe oxybutnin?

## 2015-05-29 NOTE — Telephone Encounter (Signed)
Changed historically pls follow through

## 2015-05-29 NOTE — Telephone Encounter (Signed)
Med sent.

## 2015-05-30 DIAGNOSIS — F32A Depression, unspecified: Secondary | ICD-10-CM | POA: Insufficient documentation

## 2015-05-30 DIAGNOSIS — F329 Major depressive disorder, single episode, unspecified: Secondary | ICD-10-CM | POA: Insufficient documentation

## 2015-05-30 NOTE — Assessment & Plan Note (Signed)
Repots being symptomatic off of medication, will resume medication same is sent in

## 2015-05-30 NOTE — Assessment & Plan Note (Signed)
Untreated,, pt stropped medications over past several months, now ready to resume, she has been somewhat confused since loss of her spouse, and repeatedly states that family is "interfering " with her

## 2015-05-30 NOTE — Assessment & Plan Note (Signed)
Resumption of citalopram will treat this, she is neither suicidal or homicidal

## 2015-05-30 NOTE — Assessment & Plan Note (Signed)
Uncontrolled off of medication, increased paranoia is expressed, will resume citalopram, same prescribed

## 2015-06-15 ENCOUNTER — Ambulatory Visit (INDEPENDENT_AMBULATORY_CARE_PROVIDER_SITE_OTHER): Payer: Medicare Other | Admitting: Family Medicine

## 2015-06-15 ENCOUNTER — Encounter: Payer: Self-pay | Admitting: Family Medicine

## 2015-06-15 VITALS — BP 126/80 | HR 69 | Resp 16 | Ht 65.0 in | Wt 145.0 lb

## 2015-06-15 DIAGNOSIS — E785 Hyperlipidemia, unspecified: Secondary | ICD-10-CM

## 2015-06-15 DIAGNOSIS — E559 Vitamin D deficiency, unspecified: Secondary | ICD-10-CM | POA: Diagnosis not present

## 2015-06-15 DIAGNOSIS — F411 Generalized anxiety disorder: Secondary | ICD-10-CM

## 2015-06-15 DIAGNOSIS — J3089 Other allergic rhinitis: Secondary | ICD-10-CM

## 2015-06-15 DIAGNOSIS — E039 Hypothyroidism, unspecified: Secondary | ICD-10-CM | POA: Diagnosis not present

## 2015-06-15 DIAGNOSIS — K219 Gastro-esophageal reflux disease without esophagitis: Secondary | ICD-10-CM

## 2015-06-15 NOTE — Patient Instructions (Signed)
Annual wellness in 3.5 month, call if you need me sooner  Fasting labs in 3.5 month  Please commit to daily synthroid and citalopram  Use protonix , if needed for heartburn, and oxybutynin if needed fo inability to hod urine  Thanks for choosing [facility], we consider it a privelige to serve you.  All the best

## 2015-06-15 NOTE — Progress Notes (Signed)
   Subjective:    Patient ID: Rose [name], female    DOB: May 18, 1934, 81 y.o.   MRN: [id number]  HPI    Rose [name]     MRN: [id number]      DOB: 01/28/1935   HPI [name] is here for follow up and re-evaluation of chronic medical conditions, medication management and review of any available recent lab and radiology data.  Preventive health is updated, specifically  Cancer screening and Immunization.   The PT denies any adverse reactions to current medications since the last visit. However still not taking the few medications she is prescribed regularly, no reason provided. Remains focused on breakdown in relationship and her lack of trust in close family members since her spouse's death which is over 1 year ago. I have encouraged her repeatedly to try to move forward , get therapy and have open discussion with the particular sibling involved, however still not at this point. There are no specific complaints   ROS Denies recent fever or chills. Denies sinus pressure, nasal congestion, ear pain or sore throat. Denies chest congestion, productive cough or wheezing. Denies chest pains, palpitations and leg swelling Denies abdominal pain, nausea, vomiting,diarrhea or constipation.   Denies dysuria, frequency, hesitancy or incontinence. Denies joint pain, swelling and limitation in mobility. Denies headaches, seizures, numbness, or tingling. Denies depression, anxiety or insomnia. Denies skin break down or rash.   PE  BP 126/80 mmHg  Pulse 69  Resp 16  Ht 5\' 5"  (1.651 m)  Wt 145 lb (65.772 kg)  BMI 24.13 kg/m2  SpO2 100%  Patient alert and oriented and in no cardiopulmonary distress.  HEENT: No facial asymmetry, EOMI,   oropharynx pink and moist.  Neck supple no JVD, no mass.  Chest: Clear to auscultation bilaterally.  CVS: S1, S2 no murmurs, no S3.Regular rate.  ABD: Soft non tender.   Ext: No edema  MS: Adequate ROM spine, shoulders, hips and  knees.  Skin: Intact, no ulcerations or rash noted.  Psych: Good eye contact, normal affect. Memory intact not anxious or depressed appearing.Normal MMSE   CNS: CN 2-12 intact, power,  normal throughout.no focal deficits noted.   Assessment & Plan   Hypothyroidism Not consistently taking medication and encouraged to do so, she needs this Will check TSH at next visit  GAD (generalized anxiety disorder) Uncontrolled with paranoia, needs to resume citalopram which she has taken in the past with success, encouraged her again to commit to medication as prescribed  Hyperlipidemia LDL goal <100 Hyperlipidemia:Low fat diet discussed and encouraged.   Lipid Panel  Lab Results  Component Value Date   CHOL 230* 01/15/2015   HDL 98 01/15/2015   LDLCALC 115 01/15/2015   TRIG 84 01/15/2015   CHOLHDL 2.3 01/15/2015   Dietary management only     GERD (gastroesophageal reflux disease) Improved , will take PPI as needed only  Allergic rhinitis No current flare  Vitamin D deficiency Updated lab needed at/ before next visit.      Review of Systems     Objective:   Physical Exam        Assessment & Plan:

## 2015-06-23 ENCOUNTER — Other Ambulatory Visit: Payer: Self-pay | Admitting: Family Medicine

## 2015-06-29 NOTE — Assessment & Plan Note (Signed)
Improved , will take PPI as needed only

## 2015-06-29 NOTE — Assessment & Plan Note (Signed)
No current flare 

## 2015-06-29 NOTE — Assessment & Plan Note (Signed)
Hyperlipidemia:Low fat diet discussed and encouraged.   Lipid Panel  Lab Results  Component Value Date   CHOL 230* 01/15/2015   HDL 98 01/15/2015   LDLCALC 115 01/15/2015   TRIG 84 01/15/2015   CHOLHDL 2.3 01/15/2015   Dietary management only

## 2015-06-29 NOTE — Assessment & Plan Note (Signed)
Not consistently taking medication and encouraged to do so, she needs this Will check TSH at next visit

## 2015-06-29 NOTE — Assessment & Plan Note (Signed)
Updated lab needed at/ before next visit.   

## 2015-06-29 NOTE — Assessment & Plan Note (Signed)
Uncontrolled with paranoia, needs to resume citalopram which she has taken in the past with success, encouraged her again to commit to medication as prescribed

## 2015-07-10 ENCOUNTER — Other Ambulatory Visit: Payer: Self-pay | Admitting: Family Medicine

## 2015-09-14 ENCOUNTER — Ambulatory Visit (INDEPENDENT_AMBULATORY_CARE_PROVIDER_SITE_OTHER): Payer: Medicare Other | Admitting: Orthopedic Surgery

## 2015-09-14 VITALS — BP 129/59 | HR 62 | Ht 66.5 in | Wt 146.0 lb

## 2015-09-14 DIAGNOSIS — M1712 Unilateral primary osteoarthritis, left knee: Secondary | ICD-10-CM

## 2015-09-14 DIAGNOSIS — M25562 Pain in left knee: Secondary | ICD-10-CM | POA: Diagnosis not present

## 2015-09-14 NOTE — Progress Notes (Signed)
Chief Complaint  Patient presents with  . Knee Pain    Left knee pain     Procedure note left knee injection verbal consent was obtained to inject left knee joint  Timeout was completed to confirm the site of injection  The medications used were 40 mg of Depo-Medrol and 1% lidocaine 3 cc  Anesthesia was provided by ethyl chloride and the skin was prepped with alcohol.  After cleaning the skin with alcohol a 20-gauge needle was used to inject the left knee joint. There were no complications. A sterile bandage was applied.

## 2015-09-22 ENCOUNTER — Ambulatory Visit (INDEPENDENT_AMBULATORY_CARE_PROVIDER_SITE_OTHER): Payer: Medicare Other | Admitting: Family Medicine

## 2015-09-22 ENCOUNTER — Encounter: Payer: Self-pay | Admitting: Family Medicine

## 2015-09-22 VITALS — BP 138/80 | HR 67 | Resp 16 | Ht 67.0 in | Wt 146.0 lb

## 2015-09-22 DIAGNOSIS — E785 Hyperlipidemia, unspecified: Secondary | ICD-10-CM

## 2015-09-22 DIAGNOSIS — E038 Other specified hypothyroidism: Secondary | ICD-10-CM | POA: Diagnosis not present

## 2015-09-22 DIAGNOSIS — F411 Generalized anxiety disorder: Secondary | ICD-10-CM | POA: Diagnosis not present

## 2015-09-22 DIAGNOSIS — M1712 Unilateral primary osteoarthritis, left knee: Secondary | ICD-10-CM

## 2015-09-22 DIAGNOSIS — R350 Frequency of micturition: Secondary | ICD-10-CM | POA: Diagnosis not present

## 2015-09-22 DIAGNOSIS — E039 Hypothyroidism, unspecified: Secondary | ICD-10-CM | POA: Diagnosis not present

## 2015-09-22 DIAGNOSIS — E559 Vitamin D deficiency, unspecified: Secondary | ICD-10-CM | POA: Diagnosis not present

## 2015-09-22 DIAGNOSIS — M810 Age-related osteoporosis without current pathological fracture: Secondary | ICD-10-CM

## 2015-09-22 MED ORDER — ALENDRONATE SODIUM 70 MG PO TABS: 70.0000 mg | ORAL_TABLET | ORAL | Status: DC

## 2015-09-22 NOTE — Progress Notes (Signed)
   Subjective:    Patient ID: Rose [name], female    DOB: May 04, 1935, 81 y.o.   MRN: [id number]  HPI    Rose [name]     MRN: [id number]      DOB: 10-22-1934   HPI Rose [name] is here for follow up and re-evaluation of chronic medical conditions, medication management and review of any available recent lab and radiology data.  Preventive health is updated, specifically  Cancer screening and Immunization.   Questions or concerns regarding consultations or procedures which the PT has had in the interim are  addressed. The PT denies any adverse reactions to current medications since the last visit.  There are no new concerns.  There are no specific complaints   ROS Denies recent fever or chills. Denies sinus pressure, nasal congestion, ear pain or sore throat. Denies chest congestion, productive cough or wheezing. Denies chest pains, palpitations and leg swelling Denies abdominal pain, nausea, vomiting,diarrhea or constipation.   Denies dysuria, frequency, hesitancy or incontinence. Denies joint pain, swelling and limitation in mobility. Denies headaches, seizures, numbness, or tingling. Denies depression, anxiety or insomnia. Denies skin break down or rash.   PE  BP 138/80 mmHg  Pulse 67  Resp 16  Ht 5\' 7"  (1.702 m)  Wt 146 lb (66.225 kg)  BMI 22.86 kg/m2  SpO2 98%  Patient alert and oriented and in no cardiopulmonary distress.  HEENT: No facial asymmetry, EOMI,   oropharynx pink and moist.  Neck supple no JVD, no mass.  Chest: Clear to auscultation bilaterally.  CVS: S1, S2 no murmurs, no S3.Regular rate.  ABD: Soft non tender.   Ext: No edema  MS: Adequate ROM spine, shoulders, hips and knees.  Skin: Intact, no ulcerations or rash noted.  Psych: Good eye contact, normal affect. Memory intact not anxious or depressed appearing.  CNS: CN 2-12 intact, power,  normal throughout.no focal deficits noted.   Assessment &  Plan   Hypothyroidism Controlled, no change in medication   Urinary frequency Controlled, no change in medication   Hyperlipidemia LDL goal <100 Marked improvem,ent Hyperlipidemia:Low fat diet discussed and encouraged.   Lipid Panel  Lab Results  Component Value Date   CHOL 196 09/22/2015   HDL 84 09/22/2015   LDLCALC 89 09/22/2015   TRIG 113 09/22/2015   CHOLHDL 2.3 09/22/2015        GAD (generalized anxiety disorder) Controlled, no change in medication   Primary osteoarthritis of left knee Improved pain following intra articular injection  Osteoporosis Regular weight bearing activity, calcium and D with weekly bone builder to be continued      Review of Systems     Objective:   Physical Exam        Assessment & Plan:

## 2015-09-22 NOTE — Patient Instructions (Signed)
Annual wellness in 4 months  Labs today please  Continue to care for yourself  Thank you  for choosing [facility]. We consider it a privelige to serve you.  Delivering excellent health care in a caring and  compassionate way is our goal.  Partnering with you,  so that together we can achieve this goal is our strategy.

## 2015-09-23 LAB — BASIC METABOLIC PANEL
BUN: 10 mg/dL (ref 7–25)
CALCIUM: 8.9 mg/dL (ref 8.6–10.4)
CO2: 27 mmol/L (ref 20–31)
CREATININE: 0.67 mg/dL (ref 0.60–0.93)
Chloride: 103 mmol/L (ref 98–110)
GLUCOSE: 83 mg/dL (ref 65–99)
Potassium: 3.9 mmol/L (ref 3.5–5.3)
SODIUM: 141 mmol/L (ref 135–146)

## 2015-09-23 LAB — CBC WITH DIFFERENTIAL/PLATELET
BASOS ABS: 54 {cells}/uL (ref 0–200)
Basophils Relative: 1 %
EOS ABS: 108 {cells}/uL (ref 15–500)
EOS PCT: 2 %
HCT: 39 % (ref 35.0–45.0)
HEMOGLOBIN: 13 g/dL (ref 11.7–15.5)
LYMPHS ABS: 1836 {cells}/uL (ref 850–3900)
Lymphocytes Relative: 34 %
MCH: 29.1 pg (ref 27.0–33.0)
MCHC: 33.3 g/dL (ref 32.0–36.0)
MCV: 87.4 fL (ref 80.0–100.0)
MONOS PCT: 7 %
MPV: 10.4 fL (ref 7.5–12.5)
Monocytes Absolute: 378 cells/uL (ref 200–950)
NEUTROS PCT: 56 %
Neutro Abs: 3024 cells/uL (ref 1500–7800)
Platelets: 214 10*3/uL (ref 140–400)
RBC: 4.46 MIL/uL (ref 3.80–5.10)
RDW: 14 % (ref 11.0–15.0)
WBC: 5.4 10*3/uL (ref 3.8–10.8)

## 2015-09-23 LAB — LIPID PANEL
CHOL/HDL RATIO: 2.3 ratio (ref <=5.0)
CHOLESTEROL: 196 mg/dL (ref 125–200)
HDL: 84 mg/dL (ref >=46)
LDL Cholesterol: 89 mg/dL (ref <130)
Triglycerides: 113 mg/dL (ref <150)
VLDL: 23 mg/dL (ref <30)

## 2015-09-23 LAB — VITAMIN D 25 HYDROXY (VIT D DEFICIENCY, FRACTURES): Vit D, 25-Hydroxy: 30 ng/mL (ref 30–100)

## 2015-09-23 LAB — TSH: TSH: 2.39 m[IU]/L

## 2015-09-26 DIAGNOSIS — M810 Age-related osteoporosis without current pathological fracture: Secondary | ICD-10-CM | POA: Insufficient documentation

## 2015-09-26 NOTE — Assessment & Plan Note (Signed)
Regular weight bearing activity, calcium and D with weekly bone builder to be continued

## 2015-09-26 NOTE — Assessment & Plan Note (Signed)
Controlled, no change in medication  

## 2015-09-26 NOTE — Assessment & Plan Note (Signed)
Marked improvem,ent Hyperlipidemia:Low fat diet discussed and encouraged.   Lipid Panel  Lab Results  Component Value Date   CHOL 196 09/22/2015   HDL 84 09/22/2015   LDLCALC 89 09/22/2015   TRIG 113 09/22/2015   CHOLHDL 2.3 09/22/2015

## 2015-09-26 NOTE — Assessment & Plan Note (Signed)
Improved pain following intra articular injection

## 2015-10-29 ENCOUNTER — Ambulatory Visit: Payer: Medicare Other | Admitting: Orthopedic Surgery

## 2015-11-02 ENCOUNTER — Ambulatory Visit (INDEPENDENT_AMBULATORY_CARE_PROVIDER_SITE_OTHER): Payer: Medicare Other

## 2015-11-02 ENCOUNTER — Ambulatory Visit (INDEPENDENT_AMBULATORY_CARE_PROVIDER_SITE_OTHER): Payer: Medicare Other | Admitting: Orthopedic Surgery

## 2015-11-02 VITALS — BP 117/66 | HR 73 | Ht 66.5 in | Wt 140.0 lb

## 2015-11-02 DIAGNOSIS — M1710 Unilateral primary osteoarthritis, unspecified knee: Secondary | ICD-10-CM

## 2015-11-02 DIAGNOSIS — M129 Arthropathy, unspecified: Secondary | ICD-10-CM

## 2015-11-02 NOTE — Progress Notes (Signed)
Patient ID: RAEAH STOUDER, female   DOB: 09-05-1934, 80 y.o.   MRN: [id number]  Chief Complaint  Patient presents with  . Follow-up    Yearly recheck on left knee with xray.    HPI 81 year old female osteoarthritis left knee primarily lateral compartment comes in for one year checkup. She has no real complaints other than some soreness in the left knee  ROS  No mechanical symptoms, no numbness tingling or catching locking or giving way BP 117/66 mmHg  Pulse 73  Ht 5' 6.5" (1.689 m)  Wt 140 lb (63.504 kg)  BMI 22.26 kg/m2 Gen. appearance is normal grooming and hygiene Orientation to person place and time normal Mood normal Gait is normal  No peripheral edema or swelling is noted in thLeft leg  Sensory exam shows normal sensation to palpation, pressure and soft touch Skin exam no lacerations ulcerations or erythema  Ortho Exam  5 flexion contracture, flexion arc is 120 knee is stable muscle strength and tone are normal   A/P  Medical decision-making  Prior x-ray shows same amount of arthritis no change  X-ray one year   Arther Abbott, MD 11/02/2015 10:48 AM

## 2015-11-09 ENCOUNTER — Other Ambulatory Visit: Payer: Self-pay | Admitting: Family Medicine

## 2015-11-12 ENCOUNTER — Other Ambulatory Visit: Payer: Self-pay

## 2015-11-12 MED ORDER — CITALOPRAM HYDROBROMIDE 10 MG PO TABS: 10.0000 mg | ORAL_TABLET | Freq: Every day | ORAL | Status: DC

## 2015-11-16 ENCOUNTER — Other Ambulatory Visit: Payer: Self-pay

## 2015-11-16 MED ORDER — CLONAZEPAM 0.5 MG PO TABS: ORAL_TABLET | ORAL | Status: DC

## 2015-12-10 ENCOUNTER — Other Ambulatory Visit: Payer: Self-pay | Admitting: Family Medicine

## 2016-02-11 ENCOUNTER — Ambulatory Visit (INDEPENDENT_AMBULATORY_CARE_PROVIDER_SITE_OTHER): Payer: Medicare Other

## 2016-02-11 VITALS — BP 126/72 | HR 74 | Resp 18 | Ht 66.5 in | Wt 146.0 lb

## 2016-02-11 DIAGNOSIS — Z0000 Encounter for general adult medical examination without abnormal findings: Secondary | ICD-10-CM | POA: Diagnosis not present

## 2016-02-11 NOTE — Progress Notes (Signed)
Subjective:    Rose [name] is a 81 y.o. female who presents for Medicare Annual/Subsequent preventive examination.  Patient with progressive dementia accompanied by her sister Rose [name](Rose [name])  Preventive Screening-Counseling & Management  Tobacco History  Smoking Status  . Never Smoker  Smokeless Tobacco  . Never Used     Current Problems (verified) Patient Active Problem List   Diagnosis Date Noted  . Osteoporosis 09/26/2015  . Urinary frequency 02/15/2014  . Primary osteoarthritis of left knee 01/28/2014  . Premature atrial complexes 12/24/2013  . GERD (gastroesophageal reflux disease) 09/22/2010  . Allergic rhinitis 09/30/2009  . Vitamin D deficiency 06/09/2008  . Hypothyroidism 11/07/2007  . Hyperlipidemia LDL goal <100 11/07/2007  . GAD (generalized anxiety disorder) 11/07/2007    Medications Prior to Visit Current Outpatient Prescriptions on File Prior to Visit  Medication Sig Dispense Refill  . alendronate (FOSAMAX) 70 MG tablet Take 1 tablet (70 mg total) by mouth every 7 (seven) days. Take with a full glass of water on an empty stomach. 4 tablet 11  . Calcium 500-125 MG-UNIT TABS Take by mouth 3 (three) times daily.      . citalopram (CELEXA) 10 MG tablet Take 1 tablet (10 mg total) by mouth daily. 30 tablet 3  . clonazePAM (KLONOPIN) 0.5 MG tablet TAKE 1 TABLET BY MOUTH TWICE A DAY AS NEEDED FOR ANXIETY 30 tablet 0  . levothyroxine (SYNTHROID, LEVOTHROID) 25 MCG tablet TAKE 1 TABLET BY MOUTH EVERY DAY 30 tablet 4  . oxybutynin (DITROPAN) 5 MG tablet TAKE ONE TABLET ONCE DAILY 30 tablet 4   No current facility-administered medications on file prior to visit.     Current Medications (verified) Current Outpatient Prescriptions  Medication Sig Dispense Refill  . alendronate (FOSAMAX) 70 MG tablet Take 1 tablet (70 mg total) by mouth every 7 (seven) days. Take with a full glass of water on an empty stomach. 4 tablet 11  . Calcium 500-125 MG-UNIT TABS Take by  mouth 3 (three) times daily.      . citalopram (CELEXA) 10 MG tablet Take 1 tablet (10 mg total) by mouth daily. 30 tablet 3  . clonazePAM (KLONOPIN) 0.5 MG tablet TAKE 1 TABLET BY MOUTH TWICE A DAY AS NEEDED FOR ANXIETY 30 tablet 0  . levothyroxine (SYNTHROID, LEVOTHROID) 25 MCG tablet TAKE 1 TABLET BY MOUTH EVERY DAY 30 tablet 4  . oxybutynin (DITROPAN) 5 MG tablet TAKE ONE TABLET ONCE DAILY 30 tablet 4   No current facility-administered medications for this visit.      Allergies (verified) Penicillins   PAST HISTORY  Family History Family History  Problem Relation Age of Onset  . Colon cancer Mother   . Colon cancer Father   . Heart disease Father   . Cancer Brother [phone]    prostate   . Hypertension Sister   . Hypertension Sister     Social History Social History  Substance Use Topics  . Smoking status: Never Smoker  . Smokeless tobacco: Never Used  . Alcohol use No     Are there smokers in your home (other than you)? No  Risk Factors Current exercise habits: The patient does not participate in regular exercise at present.  Dietary issues discussed: Healthy fast food options discussed; patient does not cook much at home due to living alone     Cardiac risk factors: advanced age (older than 4755 for men, 6565 for women).  Depression Screen (currently on medication) (Note: if answer to either of the  following is "Yes", a more complete depression screening is indicated)   Over the past two weeks, have you felt down, depressed or hopeless? No  Over the past two weeks, have you felt little interest or pleasure in doing things? No  Have you lost interest or pleasure in daily life? No  Do you often feel hopeless? No  Do you cry easily over simple problems? No  Activities of Daily Living In your present state of health, do you have any difficulty performing the following activities?:  Driving? No Managing money?  Yes Feeding yourself? No Getting from bed to chair? NoNo  exam performed today, annual wellness visit without physical exam. Climbing a flight of stairs? No Preparing food and eating?: No Bathing or showering? No Getting dressed: No Getting to the toilet? No Using the toilet:No Moving around from place to place: No In the past year have you fallen or had a near fall?:No   Are you sexually active?  No  Do you have more than one partner?  n/a  Hearing Difficulties: No Do you often ask people to speak up or repeat themselves? No Do you experience ringing or noises in your ears? No Do you have difficulty understanding soft or whispered voices? No   Do you feel that you have a problem with memory? Yes  Do you often misplace items? Yes  Do you feel safe at home?  Yes  Cognitive Testing  Alert? Yes  Normal Appearance?Yes  Oriented to person? Yes  Place? Yes   Time? Yes  Recall of three objects?  No  Can perform simple calculations? No  Displays appropriate judgment?No  Can read the correct time from a watch face?Yes   Advanced Directives have been discussed with the patient? No  List the Names of Other Physician/Practitioners you currently use: 1.  Dr. Gershon Crane (opthamology)   Indicate any recent Medical Services you may have received from other than [facility] providers in the past year (date may be approximate).  Immunization History  Administered Date(s) Administered  . Influenza Split 01/08/2014  . Influenza Whole 02/14/2007, 01/18/2009, 02/11/2010  . Influenza,inj,Quad PF,36+ Mos 01/07/2015  . Pneumococcal Conjugate-13 11/25/2013  . Pneumococcal Polysaccharide-23 03/30/2004  . Td 01/21/2004  . Zoster 07/12/2006    Screening Tests Health Maintenance  Topic Date Due  . TETANUS/TDAP  03/31/2016 (Originally 01/20/2014)  . INFLUENZA VACCINE  06/16/2016 (Originally 12/15/2015)  . COLONOSCOPY  09/07/2016  . DEXA SCAN  Completed  . ZOSTAVAX  Completed  . PNA vac Low Risk Adult  Completed    All answers were reviewed with the patient  and necessary referrals were made:  Vanetta Mulders, LPN   [id number]   History reviewed: allergies, current medications, past family history, past medical history, past social history, past surgical history and problem list  Review of Systems A comprehensive review of systems was negative.    Objective:     Vision by Snellen chart: right eye:20/25, left eye:20/25  Body mass index is 23.22 kg/m. BP 126/72   Pulse 74   Resp 18   Ht 5' 6.5" (1.689 m)   Wt 146 lb 0.6 oz (66.2 kg)   SpO2 99%   BMI 23.22 kg/m   No exam performed today, annual wellness without physcial exam.     Assessment:       Plan:     During the course of the visit the patient was educated and counseled about appropriate screening and preventive services including:    Influenza  vaccine  Nutrition counseling   Diet review for nutrition referral? Yes ____  Not Indicated __x__   Patient Instructions (the written plan) was given to the patient.  Medicare Attestation I have personally reviewed: The patient's medical and social history Their use of alcohol, tobacco or illicit drugs Their current medications and supplements The patient's functional ability including ADLs,fall risks, home safety risks, cognitive, and hearing and visual impairment Diet and physical activities Evidence for depression or mood disorders  The patient's weight, height, BMI, and visual acuity have been recorded in the chart.  I have made referrals, counseling, and provided education to the patient based on review of the above and I have provided the patient with a written personalized care plan for preventive services.     Denman George [address], [place]   [id number]

## 2016-02-11 NOTE — Patient Instructions (Signed)
Thank you for choosing [facility] for your health care needs  The Annual Wellness Visit is designed to allow [place] the chance to assist you in preserving and improving you health.   Dr. Moshe Cipro will see you back in January  Labs needed will be mailed to you   If you have any concerns please don't hesitate to call.  The new # is [phone]

## 2016-06-15 ENCOUNTER — Ambulatory Visit (INDEPENDENT_AMBULATORY_CARE_PROVIDER_SITE_OTHER): Payer: Medicare Other | Admitting: Family Medicine

## 2016-06-15 ENCOUNTER — Encounter: Payer: Self-pay | Admitting: Family Medicine

## 2016-06-15 VITALS — BP 136/80 | HR 81 | Resp 16 | Ht 67.0 in | Wt 144.0 lb

## 2016-06-15 DIAGNOSIS — E559 Vitamin D deficiency, unspecified: Secondary | ICD-10-CM | POA: Diagnosis not present

## 2016-06-15 DIAGNOSIS — R350 Frequency of micturition: Secondary | ICD-10-CM

## 2016-06-15 DIAGNOSIS — E785 Hyperlipidemia, unspecified: Secondary | ICD-10-CM

## 2016-06-15 DIAGNOSIS — M1712 Unilateral primary osteoarthritis, left knee: Secondary | ICD-10-CM

## 2016-06-15 DIAGNOSIS — E038 Other specified hypothyroidism: Secondary | ICD-10-CM

## 2016-06-15 DIAGNOSIS — Z638 Other specified problems related to primary support group: Secondary | ICD-10-CM

## 2016-06-15 DIAGNOSIS — R946 Abnormal results of thyroid function studies: Secondary | ICD-10-CM

## 2016-06-15 DIAGNOSIS — R7989 Other specified abnormal findings of blood chemistry: Secondary | ICD-10-CM

## 2016-06-15 DIAGNOSIS — M818 Other osteoporosis without current pathological fracture: Secondary | ICD-10-CM

## 2016-06-15 NOTE — Patient Instructions (Signed)
F/u in   4.5 month call if you need me before   Please resume once weekly fosamax and daily calcium

## 2016-06-16 DIAGNOSIS — Z638 Other specified problems related to primary support group: Secondary | ICD-10-CM | POA: Insufficient documentation

## 2016-06-16 LAB — BASIC METABOLIC PANEL
BUN: 6 mg/dL — AB (ref 7–25)
CALCIUM: 9.4 mg/dL (ref 8.6–10.4)
CHLORIDE: 104 mmol/L (ref 98–110)
CO2: 28 mmol/L (ref 20–31)
CREATININE: 0.72 mg/dL (ref 0.60–0.93)
Glucose, Bld: 90 mg/dL (ref 65–99)
Potassium: 4.1 mmol/L (ref 3.5–5.3)
Sodium: 142 mmol/L (ref 135–146)

## 2016-06-16 LAB — TSH: TSH: 2.95 m[IU]/L

## 2016-06-16 LAB — VITAMIN D 25 HYDROXY (VIT D DEFICIENCY, FRACTURES): VIT D 25 HYDROXY: 40 ng/mL (ref 30–100)

## 2016-06-16 NOTE — Progress Notes (Signed)
Rose [name]     MRN: [id number]      DOB: [birth year]   HPI Rose [name] is here for follow up and re-evaluation of chronic medical conditions, medication management . Her sister Rose [name] accompanies her, and the concern is hat patient has stopped taking all prescribed medication, states does not feel good when she takes it, labs will determine needs for ongoing treatment with synthroid and vit D  Preventive health is updated, specifically  Cancer screening and Immunization.   Questions or concerns regarding consultations or procedures which the PT has had in the interim are  addressed. Rose [name] states she feels well and is just here so that I can check her health. With her consent I asked her sister Rose [name] what her concerns were, the main she expressed was a concern over the fact that Rose [name] was taking no medication. Also stated that her sister was now allowing her to help with bill payment on time , but also stated she was concerned that Rose [name] was increasingly forgetful  Rose [name] refused to do a MMSE ,stating she knew where "we were going" wanting to call her senile or crazy  ROS See HPI  Denies recent fever or chills. Denies sinus pressure, nasal congestion, ear pain or sore throat. Denies chest congestion, productive cough or wheezing. Denies chest pains, palpitations and leg swelling Denies abdominal pain, nausea, vomiting,diarrhea or constipation.   Denies dysuria, frequency, hesitancy or incontinence. Denies joint pain, swelling and limitation in mobility. Denies headaches, seizures, numbness, or tingling. Denies depression, anxiety or insomnia. Denies skin break down or rash.   PE  BP 136/80   Pulse 81   Resp 16   Ht 5\' 7"  (1.702 m)   Wt 144 lb (65.3 kg)   SpO2 96%   BMI 22.55 kg/m   Patient alert  and in no cardiopulmonary distress.Readily anxious and irritable when sister Rose [name] engaged in the visit , stated clearly she saw no reason for Rose [name] to be present  HEENT:  No facial asymmetry, EOMI,   oropharynx pink and moist.  Neck supple no JVD, no mass.  Chest: Clear to auscultation bilaterally.  CVS: S1, S2 no murmurs, no S3.Regular rate.  ABD: Soft non tender.   Ext: No edema  MS: Adequate ROM spine, shoulders, hips and knees.  Skin: Intact, no ulcerations or rash noted.  Psych: Good eye contact, normal affect.but at times irritable and anxious appearing.Unable to do formal MMSE as pt refused , will try at next visit with sister outside of the room if she accompanies her to the visit  CNS: CN 2-12 intact, power,  normal throughout.no focal deficits noted.   Assessment & Plan  Osteoporosis Needs to resume weekly fosamax and calcium with D , encouraged to commit to daily exercise, states will use her treadmill  Urinary frequency Asymptomatic without medication  Vitamin D deficiency Corrected with calcium and vit D supplemenet  Stress due to family tension Deteriorated relationship with sibling , Rose [name], pt has significant trust issues with this relationship, while Rose [name] is trying to help her . I believe that counseling would be benficial, however, Rose [name] is not interested and is readily irritated whenever her sis ter is involved in the visit  Primary osteoarthritis of left knee No current flare, gets intra articular injections by ortho from time to time which are beneficial reportedly  Low thyroid stimulating hormone (TSH) level Normal on current testing despite the fact that pt stopped medication reportedly over 6 months ago  Hyperlipidemia  LDL goal <100 Hyperlipidemia:Low fat diet discussed and encouraged.   Lipid Panel  Lab Results  Component Value Date   CHOL 196 09/22/2015   HDL 84 09/22/2015   LDLCALC 89 09/22/2015   TRIG 113 09/22/2015   CHOLHDL 2.3 09/22/2015   Excellent lab and denies taking medication at this time Updated lab needed at/ before next visit.

## 2016-06-16 NOTE — Assessment & Plan Note (Signed)
Asymptomatic without medication

## 2016-06-16 NOTE — Assessment & Plan Note (Signed)
Needs to resume weekly fosamax and calcium with D , encouraged to commit to daily exercise, states will use her treadmill

## 2016-06-16 NOTE — Assessment & Plan Note (Addendum)
Deteriorated relationship with sibling , Rose [name], pt has significant trust issues with this relationship, while Rose [name] is trying to help her . I believe that counseling would be benficial, however, [name] is not interested and is readily irritated whenever her sis ter is involved in the visit

## 2016-06-16 NOTE — Assessment & Plan Note (Signed)
Corrected with calcium and vit D supplemenet

## 2016-06-19 DIAGNOSIS — R7989 Other specified abnormal findings of blood chemistry: Secondary | ICD-10-CM | POA: Insufficient documentation

## 2016-06-19 NOTE — Assessment & Plan Note (Signed)
Normal on current testing despite the fact that pt stopped medication reportedly over 6 months ago

## 2016-06-19 NOTE — Assessment & Plan Note (Signed)
Hyperlipidemia:Low fat diet discussed and encouraged.   Lipid Panel  Lab Results  Component Value Date   CHOL 196 09/22/2015   HDL 84 09/22/2015   LDLCALC 89 09/22/2015   TRIG 113 09/22/2015   CHOLHDL 2.3 09/22/2015   Excellent lab and denies taking medication at this time Updated lab needed at/ before next visit.

## 2016-06-19 NOTE — Assessment & Plan Note (Signed)
No current flare, gets intra articular injections by ortho from time to time which are beneficial reportedly

## 2016-08-25 ENCOUNTER — Encounter (INDEPENDENT_AMBULATORY_CARE_PROVIDER_SITE_OTHER): Payer: Self-pay | Admitting: *Deleted

## 2016-10-19 ENCOUNTER — Encounter: Payer: Self-pay | Admitting: Family Medicine

## 2016-10-19 ENCOUNTER — Ambulatory Visit (INDEPENDENT_AMBULATORY_CARE_PROVIDER_SITE_OTHER): Payer: Medicare Other | Admitting: Family Medicine

## 2016-10-19 VITALS — BP 130/70 | HR 60 | Resp 16 | Ht 67.0 in | Wt 146.0 lb

## 2016-10-19 DIAGNOSIS — M1712 Unilateral primary osteoarthritis, left knee: Secondary | ICD-10-CM

## 2016-10-19 DIAGNOSIS — Z1239 Encounter for other screening for malignant neoplasm of breast: Secondary | ICD-10-CM

## 2016-10-19 DIAGNOSIS — D126 Benign neoplasm of colon, unspecified: Secondary | ICD-10-CM

## 2016-10-19 DIAGNOSIS — M810 Age-related osteoporosis without current pathological fracture: Secondary | ICD-10-CM

## 2016-10-19 DIAGNOSIS — Z1231 Encounter for screening mammogram for malignant neoplasm of breast: Secondary | ICD-10-CM

## 2016-10-19 DIAGNOSIS — G3184 Mild cognitive impairment, so stated: Secondary | ICD-10-CM

## 2016-10-19 DIAGNOSIS — Z638 Other specified problems related to primary support group: Secondary | ICD-10-CM | POA: Diagnosis not present

## 2016-10-19 NOTE — Patient Instructions (Signed)
F/u in 4.5 month, call if y you need me sooner  You do need a colonoscopy as well as your mammogram  You do need to take medication for osteoporosis , which is once weekly fosamax calcium and vitamin D daily   Memory screen shows that memory is not as good as it was so I DO recommend that you have a trusted family member help you with your medical and financial care , you DO need this  Thank you  for choosing [facility]. We consider it a privelige to serve you.  Delivering excellent health care in a caring and  compassionate way is our goal.  Partnering with you,  so that together we can achieve this goal is our strategy.

## 2016-10-26 DIAGNOSIS — F0390 Unspecified dementia without behavioral disturbance: Secondary | ICD-10-CM | POA: Insufficient documentation

## 2016-10-26 NOTE — Assessment & Plan Note (Signed)
Pt in on her won during this visit, very briefly started talking once again about how much she distrusted her family as they were trying to "take away her possessions " and control her money" expresses a lot of paranoia if verbalization encouraged, needs geriatric psych evaluation to best establish diagnosis and competency to make decisions and care for herself independently

## 2016-10-26 NOTE — Assessment & Plan Note (Signed)
Discussed openly with pt and put in writing the need to establish a trusted family member to be responsible for her health and personal, financial and property concerns, she became obviously distressed and started alluding to her sister [name] being "behind" this. I will need to again attempt in a short time try to have this matter closed , if not will seek social work or legal help in determining how to best proceed

## 2016-10-26 NOTE — Progress Notes (Signed)
   Rose [name]     MRN: [id number]      DOB: 1934-11-08   HPI [name] is here for follow up and re-evaluation of chronic medical conditions, medication management and review of any available recent lab and radiology data.  Preventive health is updated, specifically  Cancer screening and Immunization.  She needs both a mammogram and a repeat colonoscopy She is taking no medication  She has no concerns and states that she "feels well"   ROS Denies recent fever or chills. Denies sinus pressure, nasal congestion, ear pain or sore throat. Denies chest congestion, productive cough or wheezing. Denies chest pains, palpitations and leg swelling Denies abdominal pain, nausea, vomiting,diarrhea or constipation.   Denies dysuria, frequency, hesitancy or incontine c/o intermittent left knee pain and stiffness and sees orhtopedics for this   Denies skin break down or rash.   PE  BP 130/70   Pulse 60   Resp 16   Ht 5\' 7"  (1.702 m)   Wt 146 lb (66.2 kg)   SpO2 97%   BMI 22.87 kg/m   Patient alert  and in no cardiopulmonary distress.  HEENT: No facial asymmetry, EOMI,   oropharynx pink and moist.  Neck supple no JVD, no mass.  Chest: Clear to auscultation bilaterally.  CVS: S1, S2 no murmurs, no S3.Regular rate.  ABD: Soft non tender.   Ext: No edema  MS: Adequate ROM spine, shoulders, hips and knees.  Skin: Intact, no ulcerations or rash noted.  Psych: Good eye contact, normal affect. Memory intact not anxious or depressed appearing.  CNS: CN 2-12 intact, power,  normal throughout.no focal deficits noted.   Assessment & Plan  MCI (mild cognitive impairment) with memory loss Discussed openly with pt and put in writing the need to establish a trusted family member to be responsible for her health and personal, financial and property concerns, she became obviously distressed and started alluding to her sister Rose [name] being "behind" this. I will need to again attempt in a  short time try to have this matter closed , if not will seek social work or legal help in determining how to best proceed  Osteoporosis Needs to commit to once weekly fosamax and calcium wit D supplement  Primary osteoarthritis of left knee Receives intra articular injections by ortho intermittently  Stress due to family tension Pt in on her won during this visit, very briefly started talking once again about how much she distrusted her family as they were trying to "take away her possessions " and control her money" expresses a lot of paranoia if verbalization encouraged, needs geriatric psych evaluation to best establish diagnosis and competency to make decisions and care for herself independently

## 2016-10-26 NOTE — Assessment & Plan Note (Signed)
Needs to commit to once weekly fosamax and calcium wit D supplement

## 2016-10-26 NOTE — Assessment & Plan Note (Signed)
Receives intra articular injections by ortho intermittently

## 2016-10-27 ENCOUNTER — Encounter (INDEPENDENT_AMBULATORY_CARE_PROVIDER_SITE_OTHER): Payer: Self-pay | Admitting: *Deleted

## 2016-10-31 ENCOUNTER — Ambulatory Visit: Payer: Medicare Other

## 2016-10-31 ENCOUNTER — Encounter: Payer: Medicare Other | Admitting: Orthopedic Surgery

## 2016-10-31 ENCOUNTER — Encounter: Payer: Self-pay | Admitting: Orthopedic Surgery

## 2016-11-07 ENCOUNTER — Ambulatory Visit: Payer: Medicare Other

## 2016-11-07 ENCOUNTER — Encounter: Payer: Self-pay | Admitting: Orthopedic Surgery

## 2016-11-07 ENCOUNTER — Ambulatory Visit (INDEPENDENT_AMBULATORY_CARE_PROVIDER_SITE_OTHER): Payer: Medicare Other

## 2016-11-07 ENCOUNTER — Ambulatory Visit (INDEPENDENT_AMBULATORY_CARE_PROVIDER_SITE_OTHER): Payer: Medicare Other | Admitting: Orthopedic Surgery

## 2016-11-07 DIAGNOSIS — M1710 Unilateral primary osteoarthritis, unspecified knee: Secondary | ICD-10-CM | POA: Diagnosis not present

## 2016-11-07 NOTE — Progress Notes (Signed)
Follow up   Chief Complaint  Patient presents with  . Follow-up    XRAY LEFT KNEE ARTHRITIS     82 year old female with chronic arthritis of her left knee presents with intermittent left knee pain which is relieved with alcohol rub or Tylenol or Advil   Review of Systems  Neurological: Negative for tingling.   Exam  She is awake and alert is oriented 3 Mood and affect are normal She walks with no assistive device she has a slight flexion contracture in each knee There is crepitance on range of motion and lateral joint line tenderness without effusion her flexion is greater than 100 Neurovascular exam is intact  Plain films show lateral compartment gonarthrosis  Stable at this time recommend continue current treatment and x-ray in one year

## 2017-01-02 ENCOUNTER — Ambulatory Visit: Payer: Medicare Other | Admitting: Family Medicine

## 2017-01-12 ENCOUNTER — Telehealth: Payer: Self-pay

## 2017-01-12 NOTE — Telephone Encounter (Signed)
Called pt to schedule Medicare Annual Wellness Visit for September. -nr

## 2017-02-13 ENCOUNTER — Ambulatory Visit: Payer: Medicare Other | Admitting: Family Medicine

## 2017-02-15 DIAGNOSIS — Z23 Encounter for immunization: Secondary | ICD-10-CM | POA: Diagnosis not present

## 2017-04-04 ENCOUNTER — Ambulatory Visit: Payer: Medicare Other | Admitting: Family Medicine

## 2017-04-17 ENCOUNTER — Ambulatory Visit: Payer: Medicare Other

## 2017-05-29 ENCOUNTER — Encounter: Payer: Self-pay | Admitting: Family Medicine

## 2017-11-06 ENCOUNTER — Encounter: Payer: Self-pay | Admitting: Orthopedic Surgery

## 2017-11-06 ENCOUNTER — Ambulatory Visit: Payer: Self-pay | Admitting: Orthopedic Surgery

## 2017-11-08 ENCOUNTER — Ambulatory Visit: Payer: Self-pay | Admitting: Orthopedic Surgery

## 2017-11-29 ENCOUNTER — Ambulatory Visit: Payer: Self-pay | Admitting: Orthopedic Surgery

## 2017-11-29 ENCOUNTER — Ambulatory Visit: Payer: Medicare Other

## 2017-11-30 ENCOUNTER — Ambulatory Visit: Payer: Medicare Other

## 2017-12-06 ENCOUNTER — Ambulatory Visit (INDEPENDENT_AMBULATORY_CARE_PROVIDER_SITE_OTHER): Payer: Medicare Other | Admitting: Orthopedic Surgery

## 2017-12-06 ENCOUNTER — Ambulatory Visit (INDEPENDENT_AMBULATORY_CARE_PROVIDER_SITE_OTHER): Payer: Medicare Other

## 2017-12-06 VITALS — BP 139/79 | HR 65 | Wt 140.0 lb

## 2017-12-06 DIAGNOSIS — M1710 Unilateral primary osteoarthritis, unspecified knee: Secondary | ICD-10-CM | POA: Diagnosis not present

## 2017-12-06 DIAGNOSIS — M1712 Unilateral primary osteoarthritis, left knee: Secondary | ICD-10-CM

## 2017-12-06 NOTE — Progress Notes (Signed)
Chief Complaint  Patient presents with  . Follow-up    Recheck on left knee arthritis    History she is 83 years old widower presents for routine follow-up left knee arthritis  She has excellent function she does all her chores she is shop she lives alone she has very minimal discomfort in her left knee which is intermittent mild.  She does not require any medication.  Review of Systems  Constitutional: Negative for fever.  Skin: Negative.   Neurological: Negative for tingling.   BP 139/79   Pulse 65   Wt 140 lb (63.5 kg)   BMI 21.93 kg/m  Physical Exam  Constitutional: She is oriented to person, place, and time. She appears well-developed and well-nourished.  Musculoskeletal:       Left knee: She exhibits abnormal alignment. She exhibits no swelling, no effusion, no deformity, no LCL laxity and no MCL laxity. Tenderness found. Lateral joint line tenderness noted.  Neurological: She is alert and oriented to person, place, and time.  Psychiatric: She has a normal mood and affect. Judgment normal.  Vitals reviewed. Gait is normal and unsupported  X-ray shows very valgus arthritis of the left knee  Assessment and plan she is not having any significant symptoms so we will continue to follow her with an x-ray next year

## 2017-12-12 ENCOUNTER — Ambulatory Visit: Payer: Medicare Other | Admitting: Family Medicine

## 2017-12-14 ENCOUNTER — Encounter: Payer: Self-pay | Admitting: Family Medicine

## 2018-04-05 ENCOUNTER — Other Ambulatory Visit: Payer: Self-pay

## 2018-05-28 ENCOUNTER — Ambulatory Visit (INDEPENDENT_AMBULATORY_CARE_PROVIDER_SITE_OTHER): Payer: Medicare Other

## 2018-05-28 VITALS — BP 136/64 | HR 71 | Resp 10 | Ht 68.0 in | Wt 141.0 lb

## 2018-05-28 DIAGNOSIS — Z0000 Encounter for general adult medical examination without abnormal findings: Secondary | ICD-10-CM

## 2018-05-28 NOTE — Patient Instructions (Signed)
[name] , Thank you for taking time to come for your Medicare Wellness Visit. I appreciate your ongoing commitment to your health goals. Please review the following plan we discussed and let me know if I can assist you in the future.   Screening recommendations/referrals: Colonoscopy: patient refused  Mammogram: will schedule  Bone Density: will schedule  Recommended yearly ophthalmology/optometry visit for glaucoma screening and checkup Recommended yearly dental visit for hygiene and checkup  Vaccinations: Influenza vaccine: up to date  Pneumococcal vaccine: up to date  Tdap vaccine: up to date  Shingles vaccine: up to date    Advanced directives: in place   Conditions/risks identified: Cognitive impairment, Low PHQ9 score,   Next appointment: Wellness visit in one year    Preventive Care 13 Years and Older, Female Preventive care refers to lifestyle choices and visits with your health care provider that can promote health and wellness. What does preventive care include?  A yearly physical exam. This is also called an annual well check.  Dental exams once or twice a year.  Routine eye exams. Ask your health care provider how often you should have your eyes checked.  Personal lifestyle choices, including:  Daily care of your teeth and gums.  Regular physical activity.  Eating a healthy diet.  Avoiding tobacco and drug use.  Limiting alcohol use.  Practicing safe sex.  Taking low-dose aspirin every day.  Taking vitamin and mineral supplements as recommended by your health care provider. What happens during an annual well check? The services and screenings done by your health care provider during your annual well check will depend on your age, overall health, lifestyle risk factors, and family history of disease. Counseling  Your health care provider may ask you questions about your:  Alcohol use.  Tobacco use.  Drug use.  Emotional well-being.  Home  and relationship well-being.  Sexual activity.  Eating habits.  History of falls.  Memory and ability to understand (cognition).  Work and work Statistician.  Reproductive health. Screening  You may have the following tests or measurements:  Height, weight, and BMI.  Blood pressure.  Lipid and cholesterol levels. These may be checked every 5 years, or more frequently if you are over 26 years old.  Skin check.  Lung cancer screening. You may have this screening every year starting at age 31 if you have a 30-pack-year history of smoking and currently smoke or have quit within the past 15 years.  Fecal occult blood test (FOBT) of the stool. You may have this test every year starting at age 58.  Flexible sigmoidoscopy or colonoscopy. You may have a sigmoidoscopy every 5 years or a colonoscopy every 10 years starting at age 24.  Hepatitis C blood test.  Hepatitis B blood test.  Sexually transmitted disease (STD) testing.  Diabetes screening. This is done by checking your blood sugar (glucose) after you have not eaten for a while (fasting). You may have this done every 1-3 years.  Bone density scan. This is done to screen for osteoporosis. You may have this done starting at age 56.  Mammogram. This may be done every 1-2 years. Talk to your health care provider about how often you should have regular mammograms. Talk with your health care provider about your test results, treatment options, and if necessary, the need for more tests. Vaccines  Your health care provider may recommend certain vaccines, such as:  Influenza vaccine. This is recommended every year.  Tetanus, diphtheria, and acellular pertussis (  Tdap, Td) vaccine. You may need a Td booster every 10 years.  Zoster vaccine. You may need this after age 50.  Pneumococcal 13-valent conjugate (PCV13) vaccine. One dose is recommended after age 71.  Pneumococcal polysaccharide (PPSV23) vaccine. One dose is recommended  after age 44. Talk to your health care provider about which screenings and vaccines you need and how often you need them. This information is not intended to replace advice given to you by your health care provider. Make sure you discuss any questions you have with your health care provider. Document Released: 05/29/2015 Document Revised: 01/20/2016 Document Reviewed: 03/03/2015 Elsevier Interactive Patient Education  2017 [place] Prevention in the Home Falls can cause injuries. They can happen to people of all ages. There are many things you can do to make your home safe and to help prevent falls. What can I do on the outside of my home?  Regularly fix the edges of walkways and driveways and fix any cracks.  Remove anything that might make you trip as you walk through a door, such as a raised step or threshold.  Trim any bushes or trees on the path to your home.  Use bright outdoor lighting.  Clear any walking paths of anything that might make someone trip, such as rocks or tools.  Regularly check to see if handrails are loose or broken. Make sure that both sides of any steps have handrails.  Any raised decks and porches should have guardrails on the edges.  Have any leaves, snow, or ice cleared regularly.  Use sand or salt on walking paths during winter.  Clean up any spills in your garage right away. This includes oil or grease spills. What can I do in the bathroom?  Use night lights.  Install grab bars by the toilet and in the tub and shower. Do not use towel bars as grab bars.  Use non-skid mats or decals in the tub or shower.  If you need to sit down in the shower, use a plastic, non-slip stool.  Keep the floor dry. Clean up any water that spills on the floor as soon as it happens.  Remove soap buildup in the tub or shower regularly.  Attach bath mats securely with double-sided non-slip rug tape.  Do not have throw rugs and other things on the floor  that can make you trip. What can I do in the bedroom?  Use night lights.  Make sure that you have a light by your bed that is easy to reach.  Do not use any sheets or blankets that are too big for your bed. They should not hang down onto the floor.  Have a firm chair that has side arms. You can use this for support while you get dressed.  Do not have throw rugs and other things on the floor that can make you trip. What can I do in the kitchen?  Clean up any spills right away.  Avoid walking on wet floors.  Keep items that you use a lot in easy-to-reach places.  If you need to reach something above you, use a strong step stool that has a grab bar.  Keep electrical cords out of the way.  Do not use floor polish or wax that makes floors slippery. If you must use wax, use non-skid floor wax.  Do not have throw rugs and other things on the floor that can make you trip. What can I do with my stairs?  Do  not leave any items on the stairs.  Make sure that there are handrails on both sides of the stairs and use them. Fix handrails that are broken or loose. Make sure that handrails are as long as the stairways.  Check any carpeting to make sure that it is firmly attached to the stairs. Fix any carpet that is loose or worn.  Avoid having throw rugs at the top or bottom of the stairs. If you do have throw rugs, attach them to the floor with carpet tape.  Make sure that you have a light switch at the top of the stairs and the bottom of the stairs. If you do not have them, ask someone to add them for you. What else can I do to help prevent falls?  Wear shoes that:  Do not have high heels.  Have rubber bottoms.  Are comfortable and fit you well.  Are closed at the toe. Do not wear sandals.  If you use a stepladder:  Make sure that it is fully opened. Do not climb a closed stepladder.  Make sure that both sides of the stepladder are locked into place.  Ask someone to hold it  for you, if possible.  Clearly mark and make sure that you can see:  Any grab bars or handrails.  First and last steps.  Where the edge of each step is.  Use tools that help you move around (mobility aids) if they are needed. These include:  Canes.  Walkers.  Scooters.  Crutches.  Turn on the lights when you go into a dark area. Replace any light bulbs as soon as they burn out.  Set up your furniture so you have a clear path. Avoid moving your furniture around.  If any of your floors are uneven, fix them.  If there are any pets around you, be aware of where they are.  Review your medicines with your doctor. Some medicines can make you feel dizzy. This can increase your chance of falling. Ask your doctor what other things that you can do to help prevent falls. This information is not intended to replace advice given to you by your health care provider. Make sure you discuss any questions you have with your health care provider. Document Released: 02/26/2009 Document Revised: 10/08/2015 Document Reviewed: 06/06/2014 Elsevier Interactive Patient Education  2017 [employer].

## 2018-05-28 NOTE — Progress Notes (Signed)
Subjective:   TUNISIA LANDGREBE is a 84 y.o. female who presents for Medicare Annual (Subsequent) preventive examination.  Review of Systems:  Cardiac Risk Factors include: advanced age (>93men, >9 women);sedentary lifestyle     Objective:     Vitals: BP 136/64   Pulse 71   Resp 10   Wt 141 lb (64 kg)   SpO2 96%   BMI 22.08 kg/m   Body mass index is 22.08 kg/m.  Advanced Directives 05/28/2018  Does Patient Have a Medical Advance Directive? Yes  Type of Advance Directive Living will  Does patient want to make changes to medical advance directive? No - Patient declined    Tobacco Social History   Tobacco Use  Smoking Status Never Smoker  Smokeless Tobacco Never Used     Counseling given: Not Answered   Clinical Intake:  Pre-visit preparation completed: Yes  Pain : No/denies pain Pain Score: 0-No pain     BMI - recorded: 21.4 Nutritional Status: BMI of 19-24  Normal Nutritional Risks: None Diabetes: No  How often do you need to have someone help you when you read instructions, pamphlets, or other written materials from your doctor or pharmacy?: 1 - Never What is the last grade level you completed in school?: 12+  Interpreter Needed?: No  Information entered by :: Guinevere Scarlet   Past Medical History:  Diagnosis Date  . Chronic anxiety   . Dyslipidemia   . Hypothyroidism   . Palpitations    Frequent PACs by Holter monitor June 2011   Past Surgical History:  Procedure Laterality Date  . ABDOMINAL HYSTERECTOMY    . APPENDECTOMY    . CHOLECYSTECTOMY    . COLONOSCOPY  09/08/2011   Procedure: COLONOSCOPY;  Surgeon: Rogene Houston, MD;  Location: [facility];  Service: Endoscopy;  Laterality: N/A;  2:10  . PARTIAL HYSTERECTOMY     Family History  Problem Relation Age of Onset  . Colon cancer Mother   . Colon cancer Father   . Heart disease Father   . Cancer Brother 26       prostate   . Hypertension Sister   . Hypertension Sister     Social History   Socioeconomic History  . Marital status: Widowed    Spouse name: Not on file  . Number of children: 0  . Years of education: 12+  . Highest education level: Associate degree: occupational, Hotel manager, or vocational program  Occupational History  . Occupation: retired   Scientific laboratory technician  . Financial resource strain: Not hard at all  . Food insecurity:    Worry: Never true    Inability: Never true  . Transportation needs:    Medical: No    Non-medical: No  Tobacco Use  . Smoking status: Never Smoker  . Smokeless tobacco: Never Used  Substance and Sexual Activity  . Alcohol use: No  . Drug use: No  . Sexual activity: Not Currently  Lifestyle  . Physical activity:    Days per week: 0 days    Minutes per session: 0 min  . Stress: Not at all  Relationships  . Social connections:    Talks on phone: Once a week    Gets together: Once a week    Attends religious service: More than 4 times per year    Active member of club or organization: Yes    Attends meetings of clubs or organizations: More than 4 times per year    Relationship status:  Widowed  Other Topics Concern  . Not on file  Social History Narrative   Sister from up [place] is currently living with her     Outpatient Encounter Medications as of 05/28/2018  Medication Sig  . alendronate (FOSAMAX) 70 MG tablet Take 1 tablet (70 mg total) by mouth every 7 (seven) days. Take with a full glass of water on an empty stomach.  . Calcium 500-125 MG-UNIT TABS Take by mouth 3 (three) times daily.     No facility-administered encounter medications on file as of 05/28/2018.     Activities of Daily Living In your present state of health, do you have any difficulty performing the following activities: 05/28/2018  Hearing? N  Vision? N  Difficulty concentrating or making decisions? N  Walking or climbing stairs? N  Dressing or bathing? N  Doing errands, shopping? N  Preparing Food and eating ? N  Using the  Toilet? N  In the past six months, have you accidently leaked urine? N  Do you have problems with loss of bowel control? N  Managing your Medications? N  Managing your Finances? N  Housekeeping or managing your Housekeeping? N  Some recent data might be hidden    Patient Care Team: Fayrene Helper, MD as PCP - General Lowella Bandy, MD as Attending Physician (Urology) Rutherford Guys, MD as Attending Physician (Ophthalmology) Satira Sark, MD as Consulting Physician (Cardiology) Carole Civil, MD as Consulting Physician (Orthopedic Surgery)    Assessment:   This is a routine wellness examination for [place].  Exercise Activities and Dietary recommendations Current Exercise Habits: The patient does not participate in regular exercise at present, Exercise limited by: neurologic condition(s)  Goals    . DIET - EAT MORE FRUITS AND VEGETABLES    . Increase physical activity       Fall Risk Fall Risk  05/28/2018 04/05/2018 10/19/2016 09/22/2015 06/15/2015  Falls in the past year? 0 0 No No No  Comment - Emmi Telephone Survey: data to providers prior to load - - -   Is the patient's home free of loose throw rugs in walkways, pet beds, electrical cords, etc?   yes      Grab bars in the bathroom? no      Handrails on the stairs?   yes      Adequate lighting?   yes  Timed Get Up and Go performed: Patient able to perform in 6 seconds without assistance   Depression Screen PHQ 2/9 Scores 05/28/2018 06/15/2016 09/22/2015 04/15/2015  PHQ - 2 Score 1 1 4 4   PHQ- 9 Score - - 8 10     Cognitive Function MMSE - Mini Mental State Exam 05/28/2018 10/19/2016  Not completed: Refused -  Orientation to time 0 5  Orientation to Place 1 3  Registration 0 2  Attention/ Calculation 0 1  Recall 0 2  Language- name 2 objects 0 1  Language- repeat 1 1  Language- follow 3 step command 1 3  Language- read & follow direction 0 1  Write a sentence 0 1  Copy design 0 0  Total score 3 20      6CIT Screen 05/28/2018  What Year? 4 points  What month? 3 points  What time? 0 points  Count back from 20 4 points  Months in reverse 4 points  Repeat phrase 10 points  Total Score 25    Immunization History  Administered Date(s) Administered  . Influenza Split 01/08/2014  . Influenza  Whole 02/14/2007, 01/18/2009, 02/11/2010  . Influenza,inj,Quad PF,6+ Mos 01/07/2015, 02/12/2018  . Influenza-Unspecified 02/15/2017  . Pneumococcal Conjugate-13 11/25/2013  . Pneumococcal Polysaccharide-23 03/30/2004  . Td 01/21/2004  . Zoster 07/12/2006    Qualifies for Shingles Vaccine? Up to date   Screening Tests Health Maintenance  Topic Date Due  . COLONOSCOPY  09/07/2016  . TETANUS/TDAP  08/02/2018 (Originally 01/20/2014)  . INFLUENZA VACCINE  Completed  . DEXA SCAN  Completed  . PNA vac Low Risk Adult  Completed    Cancer Screenings: Lung: Low Dose CT Chest recommended if Age 28-80 years, 30 pack-year currently smoking OR have quit w/in 15years. Patient does not qualify. Breast:  Up to date on Mammogram? No   Up to date of Bone Density/Dexa? No Colorectal: will schedule   Additional Screenings:: Hepatitis C Screening: N/A      Plan:   Increase physical activity, increase fruit and vegetables, follow up with dr. Moshe Cipro   I have personally reviewed and noted the following in the patient's chart:   . Medical and social history . Use of alcohol, tobacco or illicit drugs  . Current medications and supplements . Functional ability and status . Nutritional status . Physical activity . Advanced directives . List of other physicians . Hospitalizations, surgeries, and ER visits in previous 12 months . Vitals . Screenings to include cognitive, depression, and falls . Referrals and appointments  In addition, I have reviewed and discussed with patient certain preventive protocols, quality metrics, and best practice recommendations. A written personalized care plan for preventive  services as well as general preventive health recommendations were provided to patient.     Hayden Pedro, LPN  [id number]

## 2018-05-29 ENCOUNTER — Ambulatory Visit (INDEPENDENT_AMBULATORY_CARE_PROVIDER_SITE_OTHER): Payer: Medicare Other | Admitting: Family Medicine

## 2018-05-29 ENCOUNTER — Encounter: Payer: Self-pay | Admitting: Family Medicine

## 2018-05-29 VITALS — BP 118/60 | HR 67 | Resp 16 | Ht 68.0 in | Wt 140.0 lb

## 2018-05-29 DIAGNOSIS — Z1231 Encounter for screening mammogram for malignant neoplasm of breast: Secondary | ICD-10-CM | POA: Diagnosis not present

## 2018-05-29 DIAGNOSIS — E785 Hyperlipidemia, unspecified: Secondary | ICD-10-CM

## 2018-05-29 DIAGNOSIS — F0390 Unspecified dementia without behavioral disturbance: Secondary | ICD-10-CM

## 2018-05-29 DIAGNOSIS — M810 Age-related osteoporosis without current pathological fracture: Secondary | ICD-10-CM | POA: Diagnosis not present

## 2018-05-29 DIAGNOSIS — E039 Hypothyroidism, unspecified: Secondary | ICD-10-CM | POA: Diagnosis not present

## 2018-05-29 DIAGNOSIS — M1712 Unilateral primary osteoarthritis, left knee: Secondary | ICD-10-CM

## 2018-05-29 DIAGNOSIS — G309 Alzheimer's disease, unspecified: Secondary | ICD-10-CM | POA: Diagnosis not present

## 2018-05-29 MED ORDER — ALENDRONATE SODIUM 70 MG PO TABS: 70.0000 mg | ORAL_TABLET | ORAL | 11 refills | Status: DC

## 2018-05-29 MED ORDER — DONEPEZIL HCL 5 MG PO TABS: 5.0000 mg | ORAL_TABLET | Freq: Every day | ORAL | 2 refills | Status: DC

## 2018-05-29 NOTE — Patient Instructions (Addendum)
F/U in 2.5 months, call if you need me sooner  Please schedule mammogram at checkout  CBC, lipid , cmp and eGFR and tSH today and B12 level and RPR  You are referred for brain scan we will call with appt  New medication to start once weekly fosamax for bones and every evening aricept for health  Take 1 multivitamin daily and calcium one daily

## 2018-05-30 LAB — COMPLETE METABOLIC PANEL WITH GFR
AG Ratio: 1.6 (calc) (ref 1.0–2.5)
ALT: 10 U/L (ref 6–29)
AST: 18 U/L (ref 10–35)
Albumin: 4 g/dL (ref 3.6–5.1)
Alkaline phosphatase (APISO): 122 U/L (ref 33–130)
BUN: 8 mg/dL (ref 7–25)
CALCIUM: 9.6 mg/dL (ref 8.6–10.4)
CO2: 31 mmol/L (ref 20–32)
CREATININE: 0.72 mg/dL (ref 0.60–0.88)
Chloride: 105 mmol/L (ref 98–110)
GFR, EST NON AFRICAN AMERICAN: 79 mL/min/{1.73_m2} (ref >=60)
GFR, Est African American: 91 mL/min/{1.73_m2} (ref >=60)
Globulin: 2.5 g/dL (calc) (ref 1.9–3.7)
Glucose, Bld: 98 mg/dL (ref 65–99)
POTASSIUM: 4.1 mmol/L (ref 3.5–5.3)
Sodium: 141 mmol/L (ref 135–146)
TOTAL PROTEIN: 6.5 g/dL (ref 6.1–8.1)
Total Bilirubin: 0.6 mg/dL (ref 0.2–1.2)

## 2018-05-30 LAB — LIPID PANEL
CHOL/HDL RATIO: 2.3 (calc) (ref <5.0)
Cholesterol: 220 mg/dL — ABNORMAL HIGH (ref <200)
HDL: 94 mg/dL (ref >50)
LDL Cholesterol (Calc): 111 mg/dL (calc) — ABNORMAL HIGH
NON-HDL CHOLESTEROL (CALC): 126 mg/dL (ref <130)
Triglycerides: 64 mg/dL (ref <150)

## 2018-05-30 LAB — CBC
HEMATOCRIT: 38.6 % (ref 35.0–45.0)
HEMOGLOBIN: 12.8 g/dL (ref 11.7–15.5)
MCH: 29.1 pg (ref 27.0–33.0)
MCHC: 33.2 g/dL (ref 32.0–36.0)
MCV: 87.7 fL (ref 80.0–100.0)
MPV: 10.8 fL (ref 7.5–12.5)
Platelets: 238 10*3/uL (ref 140–400)
RBC: 4.4 10*6/uL (ref 3.80–5.10)
RDW: 12.6 % (ref 11.0–15.0)
WBC: 4.1 10*3/uL (ref 3.8–10.8)

## 2018-05-30 LAB — TSH: TSH: 4.47 mIU/L (ref 0.40–4.50)

## 2018-05-30 LAB — RPR: RPR Ser Ql: NONREACTIVE

## 2018-05-30 LAB — VITAMIN B12: Vitamin B-12: 277 pg/mL (ref 200–1100)

## 2018-06-07 ENCOUNTER — Encounter (HOSPITAL_COMMUNITY): Payer: Self-pay

## 2018-06-07 ENCOUNTER — Ambulatory Visit (HOSPITAL_COMMUNITY)
Admission: RE | Admit: 2018-06-07 | Discharge: 2018-06-07 | Disposition: A | Discharge status: Home / Self Care | Payer: Medicare Other | Source: Ambulatory Visit | Attending: Family Medicine | Admitting: Family Medicine

## 2018-06-07 DIAGNOSIS — Z1231 Encounter for screening mammogram for malignant neoplasm of breast: Secondary | ICD-10-CM

## 2018-06-15 ENCOUNTER — Ambulatory Visit (HOSPITAL_COMMUNITY)
Admission: RE | Admit: 2018-06-15 | Discharge: 2018-06-15 | Disposition: A | Discharge status: Home / Self Care | Payer: Medicare Other | Source: Ambulatory Visit | Attending: Family Medicine | Admitting: Family Medicine

## 2018-06-15 DIAGNOSIS — G309 Alzheimer's disease, unspecified: Secondary | ICD-10-CM | POA: Diagnosis not present

## 2018-06-15 DIAGNOSIS — R410 Disorientation, unspecified: Secondary | ICD-10-CM | POA: Diagnosis not present

## 2018-06-15 DIAGNOSIS — R413 Other amnesia: Secondary | ICD-10-CM | POA: Diagnosis not present

## 2018-06-30 ENCOUNTER — Encounter: Payer: Self-pay | Admitting: Family Medicine

## 2018-06-30 NOTE — Progress Notes (Signed)
   Rose [name]     MRN: [id number]      DOB: Nov 09, 1934   HPI Ms. Age is here for follow up and re-evaluation of chronic medical conditions, medication management and review of any available recent lab and radiology data.  Preventive health is updated, specifically  Immunization.  Her repeat colonoscopy is overdue, this will be discussed at next visit She is re establishing in the office , and is accompanied by a sibling who recently relocated from out of [place]. There is family concern regarding her memory loss, and at her wellness visit yesterday she scored extremely low. 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 Denies sinus pressure, nasal congestion, ear pain or sore throat. Denies chest congestion, productive cough or wheezing. Denies chest pains, palpitations and leg swelling Denies abdominal pain, nausea, vomiting,diarrhea or constipation.   Denies dysuria, frequency, hesitancy or incontinence. Denies uncontrolled  joint pain, swelling and limitation in mobility.Intermittently gets inra articular injections by Ortho. Denies headaches, seizures, numbness, or tingling. Denies depression, anxiety or insomnia. Denies skin break down or rash.   PE  BP 118/60   Pulse 67   Resp 16   Ht 5\' 8"  (1.727 m)   Wt 140 lb (63.5 kg)   SpO2 98%   BMI 21.29 kg/m   Patient alert and oriented and in no cardiopulmonary distress.  HEENT: No facial asymmetry, EOMI,   oropharynx pink and  moist.  Neck supple no JVD, no mass.  Chest: Clear to auscultation bilaterally.  CVS: S1, S2 no murmurs, no S3.Regular rate.  ABD: Soft non tender.   Ext: No edema  MS: Adequate ROM spine, shoulders, hips and knees.  Skin: Intact, no ulcerations or rash noted.  Psych: Good eye contact, normal affect. Memory loss not anxious or depressed appearing.  CNS: CN 2-12 intact, power,  normal throughout.no focal deficits noted.   Assessment & Plan  Dementia [facility]) Dementia with paranoia, has a report of potentially harmful behavior like overspending her money Start aricept 5 mg and increase to 10 mg over time Head CT scan to r/i any new pathology  Primary osteoarthritis of left knee Managed by Orthopedics, no falls  Osteoporosis Continue weekly fosamax  Hyperlipidemia LDL goal <100 Hyperlipidemia:Low fat diet discussed and encouraged.   Lipid Panel  Lab Results  Component Value Date   CHOL 220 (H) 05/29/2018   HDL 94 05/29/2018   LDLCALC 111 (H) 05/29/2018   TRIG 64 05/29/2018   CHOLHDL 2.3 05/29/2018   Updated lab needed at/ before next visit.

## 2018-06-30 NOTE — Assessment & Plan Note (Signed)
Managed by Orthopedics, no falls

## 2018-06-30 NOTE — Assessment & Plan Note (Signed)
Hyperlipidemia:Low fat diet discussed and encouraged.   Lipid Panel  Lab Results  Component Value Date   CHOL 220 (H) 05/29/2018   HDL 94 05/29/2018   LDLCALC 111 (H) 05/29/2018   TRIG 64 05/29/2018   CHOLHDL 2.3 05/29/2018   Updated lab needed at/ before next visit.

## 2018-06-30 NOTE — Assessment & Plan Note (Signed)
Continue weekly fosamax 

## 2018-06-30 NOTE — Assessment & Plan Note (Signed)
Dementia with paranoia, has a report of potentially harmful behavior like overspending her money Start aricept 5 mg and increase to 10 mg over time Head CT scan to r/i any new pathology

## 2018-07-26 ENCOUNTER — Other Ambulatory Visit: Payer: Self-pay | Admitting: Family Medicine

## 2018-07-26 DIAGNOSIS — F0390 Unspecified dementia without behavioral disturbance: Secondary | ICD-10-CM

## 2018-08-14 ENCOUNTER — Ambulatory Visit: Payer: Medicare Other | Admitting: Family Medicine

## 2018-09-09 ENCOUNTER — Encounter: Payer: Self-pay | Admitting: Orthopedic Surgery

## 2018-09-27 ENCOUNTER — Other Ambulatory Visit: Payer: Self-pay

## 2018-09-27 ENCOUNTER — Ambulatory Visit (INDEPENDENT_AMBULATORY_CARE_PROVIDER_SITE_OTHER): Payer: Medicare Other | Admitting: Family Medicine

## 2018-09-27 VITALS — BP 118/60 | Ht 68.0 in | Wt 140.0 lb

## 2018-09-27 DIAGNOSIS — E785 Hyperlipidemia, unspecified: Secondary | ICD-10-CM | POA: Diagnosis not present

## 2018-09-27 DIAGNOSIS — F0390 Unspecified dementia without behavioral disturbance: Secondary | ICD-10-CM | POA: Diagnosis not present

## 2018-09-27 DIAGNOSIS — M1712 Unilateral primary osteoarthritis, left knee: Secondary | ICD-10-CM | POA: Diagnosis not present

## 2018-09-27 MED ORDER — DONEPEZIL HCL 10 MG PO TABS: 10.0000 mg | ORAL_TABLET | Freq: Every day | ORAL | 1 refills | Status: DC

## 2018-09-27 NOTE — Progress Notes (Signed)
Virtual Visit via Telephone Note  I connected with Rose [name] on 09/27/18 at  9:00 AM EDT by telephone and verified that I am speaking with the correct person using two identifiers.  Location: Patient: home Provider: office   I discussed the limitations, risks, security and privacy concerns of performing an evaluation and management service by telephone and the availability of in person appointments. I also discussed with the patient that there may be a patient responsible charge related to this service. The patient expressed understanding and agreed to proceed. Sister [name] and her other sister present, [name]  The main contributor This visit type is conducted due to national recommendations for restrictions regarding the COVID -19 Pandemic. Due to the patient's age and / or co morbidities, this format is felt to be most appropriate at this time without adequate follow up. The patient has no access to video technology/ had technical difficulties with video, requiring transitioning to audio format  only ( telephone ). All issues noted this document were discussed and addressed,no physical exam can be performed in this format.    History of Present Illness: F/u dementia Pt states she feels well and has no concerns or complaints. Reports good appetite and denies any adverse s/e of her medications Denies recent fever or chills. Denies sinus pressure, nasal congestion, ear pain or sore throat. Denies chest congestion, productive cough or wheezing. Denies chest pains, palpitations and leg swelling Denies abdominal pain, nausea, vomiting,diarrhea or constipation.   Denies dysuria, frequency, hesitancy or incontinence. Denies joint pain, swelling and limitation in mobility. Denies headaches, seizures, numbness, or tingling. Denies depression, anxiety or insomnia. Denies skin break down or rash.       Observations/Objective: BP 118/60   Ht 5\' 8"  (1.727 m)   Wt 140 lb (63.5 kg)    BMI 21.29 kg/m  Good communication with no confusion and intact memory. Alert and oriented x 3 No signs of respiratory distress during sppech    Assessment and Plan: Dementia ([place]) Reportedly complying with medication with no adverse s/e , increase to treating dose. Information and support to be provided to the family  Primary osteoarthritis of left knee Denies recent flare of pain or reduced mobility, gets intra articular injections periodicaly  Hyperlipidemia LDL goal <100 Hyperlipidemia:Low fat diet discussed and encouraged.   Lipid Panel  Lab Results  Component Value Date   CHOL 220 (H) 05/29/2018   HDL 94 05/29/2018   LDLCALC 111 (H) 05/29/2018   TRIG 64 05/29/2018   CHOLHDL 2.3 05/29/2018   Encouraged to reduce fried and fatty foods      Follow Up Instructions:    I discussed the assessment and treatment plan with the patient. The patient was provided an opportunity to ask questions and all were answered. The patient agreed with the plan and demonstrated an understanding of the instructions.   The patient was advised to call back or seek an in-person evaluation if the symptoms worsen or if the condition fails to improve as anticipated.  I provided 25 minutes of non-face-to-face time during this encounter.   [name], MD

## 2018-09-27 NOTE — Patient Instructions (Addendum)
F/U in office in early September , call if you need me sooner  Dose increase in Aricept  Starting today, to 10 mg daily, oK to take TWO 5 mg tablets together every day until done  Once you complete the once weekly vit D capsules that you have , no more refills.  Start OTC vitamin D 3 ,1000 IU once daily, take with your  Centrum and calcium tablet  We will get information to you, as discussed  Thanks for choosing [facility], we consider it a privelige to serve you.  Be careful regarding home safety,to reduce your risk of falling  Social distancing. Frequent hand washing with soap and water Keeping your hands off of your face, wearing a face mask and maintaining a 6 ft distance These  practices will help to keep both you and your community healthy during this time. Please practice them faithfully!

## 2018-09-28 ENCOUNTER — Encounter: Payer: Self-pay | Admitting: Family Medicine

## 2018-09-28 NOTE — Assessment & Plan Note (Signed)
Denies recent flare of pain or reduced mobility, gets intra articular injections periodicaly

## 2018-09-28 NOTE — Assessment & Plan Note (Signed)
Hyperlipidemia:Low fat diet discussed and encouraged.   Lipid Panel  Lab Results  Component Value Date   CHOL 220 (H) 05/29/2018   HDL 94 05/29/2018   LDLCALC 111 (H) 05/29/2018   TRIG 64 05/29/2018   CHOLHDL 2.3 05/29/2018   Encouraged to reduce fried and fatty foods

## 2018-09-28 NOTE — Assessment & Plan Note (Signed)
Reportedly complying with medication with no adverse s/e , increase to treating dose. Information and support to be provided to the family

## 2018-10-25 ENCOUNTER — Telehealth: Payer: Self-pay | Admitting: *Deleted

## 2018-10-25 ENCOUNTER — Other Ambulatory Visit: Payer: Self-pay

## 2018-10-25 MED ORDER — DONEPEZIL HCL 10 MG PO TABS: 10.0000 mg | ORAL_TABLET | Freq: Every day | ORAL | 1 refills | Status: DC

## 2018-10-25 NOTE — Telephone Encounter (Signed)
Rose [name] (sister) called in stating that Rose [name] was out of donepezil and would like [facility] [place]

## 2018-10-25 NOTE — Telephone Encounter (Signed)
Med sent.

## 2018-11-12 ENCOUNTER — Ambulatory Visit: Payer: Medicare Other | Admitting: Family Medicine

## 2018-12-12 ENCOUNTER — Ambulatory Visit: Payer: Medicare Other

## 2018-12-12 ENCOUNTER — Ambulatory Visit (INDEPENDENT_AMBULATORY_CARE_PROVIDER_SITE_OTHER): Payer: Medicare Other | Admitting: Orthopedic Surgery

## 2018-12-12 ENCOUNTER — Other Ambulatory Visit: Payer: Self-pay

## 2018-12-12 ENCOUNTER — Encounter: Payer: Self-pay | Admitting: Orthopedic Surgery

## 2018-12-12 VITALS — BP 156/72 | HR 78 | Temp 97.2°F | Ht 68.0 in | Wt 141.0 lb

## 2018-12-12 DIAGNOSIS — M1712 Unilateral primary osteoarthritis, left knee: Secondary | ICD-10-CM

## 2018-12-12 NOTE — Progress Notes (Signed)
Progress Note   Patient ID: Rose [name], female   DOB: 1935-02-03, 84 y.o.   MRN: [id number]   Chief Complaint  Patient presents with  . Knee Pain    c/o left knee pain     Encounter Diagnosis  Name Primary?  . Primary osteoarthritis of left knee Yes    Allergies  Allergen Reactions  . Penicillins      Current Outpatient Medications:  .  alendronate (FOSAMAX) 70 MG tablet, Take 1 tablet (70 mg total) by mouth every 7 (seven) days. Take with a full glass of water on an empty stomach., Disp: 4 tablet, Rfl: 11 .  Calcium 500-125 MG-UNIT TABS, Take by mouth 3 (three) times daily.  , Disp: , Rfl:  .  donepezil (ARICEPT) 10 MG tablet, Take 1 tablet (10 mg total) by mouth at bedtime., Disp: 90 tablet, Rfl: 61   84 year old female with osteoarthritis left knee moderate valgus deformity but still functioning well with mild increase in left lateral knee pain occasional giving way    Review of Systems  Neurological: Negative for tingling.      BP (!) 156/72   Pulse 78   Temp (!) 97.2 F (36.2 C)   Ht 5\' 8"  (1.727 m)   Wt 141 lb (64 kg)   BMI 21.44 kg/m   Physical Exam Vitals signs and nursing note reviewed.  Constitutional:      Appearance: Normal appearance.  Musculoskeletal:     Comments: Knee is in valgus alignment lateral joint line is tender she has some suprapatellar tenderness as well.  Her range of motion is approximately 115 degrees with pain at terminal flexion otherwise the knee feels stable  Small effusion  Neurological:     Mental Status: She is alert and oriented to person, place, and time.  Psychiatric:        Mood and Affect: Mood normal.    No assistive devices for ambulation  Medical decisions:  (Established problem worse, x-ray ,physical therapy, over-the-counter medicines, read outside film or summarize x-ray)  Data  Imaging:   Today's x-ray shows osteoarthritis lateral compartment severe with moderate valgus deformity some secondary  bone changes  Encounter Diagnosis  Name Primary?  . Primary osteoarthritis of left knee Yes    PLAN:   Procedure note left knee injection   verbal consent was obtained to inject left knee joint  Timeout was completed to confirm the site of injection  The medications used were 40 mg of Depo-Medrol and 1% lidocaine 3 cc  Anesthesia was provided by ethyl chloride and the skin was prepped with alcohol.  After cleaning the skin with alcohol a 20-gauge needle was used to inject the left knee joint. There were no complications. A sterile bandage was applied.  Follow-up in a year for x-ray    Arther Abbott, MD 12/12/2018 11:51 AM

## 2018-12-31 ENCOUNTER — Telehealth: Payer: Self-pay | Admitting: *Deleted

## 2018-12-31 ENCOUNTER — Ambulatory Visit (INDEPENDENT_AMBULATORY_CARE_PROVIDER_SITE_OTHER): Payer: Medicare Other | Admitting: Family Medicine

## 2018-12-31 ENCOUNTER — Other Ambulatory Visit: Payer: Self-pay

## 2018-12-31 ENCOUNTER — Encounter: Payer: Self-pay | Admitting: Family Medicine

## 2018-12-31 VITALS — BP 138/82 | HR 82 | Temp 97.1°F | Resp 15 | Ht 68.0 in | Wt 139.0 lb

## 2018-12-31 DIAGNOSIS — E785 Hyperlipidemia, unspecified: Secondary | ICD-10-CM | POA: Diagnosis not present

## 2018-12-31 DIAGNOSIS — M1712 Unilateral primary osteoarthritis, left knee: Secondary | ICD-10-CM

## 2018-12-31 DIAGNOSIS — F0390 Unspecified dementia without behavioral disturbance: Secondary | ICD-10-CM | POA: Diagnosis not present

## 2018-12-31 DIAGNOSIS — E559 Vitamin D deficiency, unspecified: Secondary | ICD-10-CM

## 2018-12-31 DIAGNOSIS — M810 Age-related osteoporosis without current pathological fracture: Secondary | ICD-10-CM | POA: Diagnosis not present

## 2018-12-31 MED ORDER — ALENDRONATE SODIUM 70 MG PO TABS: 70.0000 mg | ORAL_TABLET | ORAL | 11 refills | Status: AC

## 2018-12-31 MED ORDER — DONEPEZIL HCL 10 MG PO TABS: 10.0000 mg | ORAL_TABLET | Freq: Every day | ORAL | 3 refills | Status: AC

## 2018-12-31 NOTE — Telephone Encounter (Signed)
Rose [name] (on [place]) called about pt saying that when she was here earlier she forgot to let Dr.Simpson know that she needed her aricept and fosamax as she was almost out of both and [facility] [address]

## 2018-12-31 NOTE — Assessment & Plan Note (Signed)
Denies any falls, gets intra articular injections occasionally for Ortho

## 2018-12-31 NOTE — Assessment & Plan Note (Addendum)
Severe and deteriorating, speaking of people wanting her  Property and unable to tell the month,date ,  year or season Not taking medication regularly.

## 2018-12-31 NOTE — Assessment & Plan Note (Signed)
Needs to take once weekly fosamax and daily calcium

## 2018-12-31 NOTE — Telephone Encounter (Signed)
meds refilled 

## 2018-12-31 NOTE — Patient Instructions (Addendum)
F/U with MD in mid January, call if you need me sooner  Call in next 1 to 2 weeks for flu vaccine   Please take these medications   Aricept 1 at bedtime for memory  Fosamax once weekly for bones  Calcium with D every day  Thanks for choosing [facility], we consider it a privelige to serve you.   Fasting lipid, cmp and EGFR, CBC, TSH and vit D  In January

## 2018-12-31 NOTE — Progress Notes (Signed)
   Rose [name]     MRN: [id number]      DOB: 14-Jan-1935   HPI Rose [name] is here for follow up and re-evaluation of chronic medical conditions, medication management and review of any available recent lab and radiology data.  Preventive health is updated, specifically  Cancer screening and Immunization.   Complains of left  knee pain and stiffness and sees orthopedics for this periodically.  She denies falls.  She reports a good appetite and denies constipation.  She continues to states she gets upset when people bother her however denies  aggressive behavior. ROS Severe dementia, unreliable historian , no family member in room with her. Denies recent fever or chills. Denies sinus pressure, nasal congestion, ear pain or sore throat. Denies chest congestion, productive cough or wheezing. Denies chest pains, palpitations and leg swelling Denies abdominal pain, nausea, vomiting,diarrhea or constipation.   Denies dysuria, frequency, hesitancy or incontinence. . Denies headaches, seizures, numbness, or tingling.  Denies skin break down or rash.   PE  BP 138/82   Pulse 82   Temp (!) 97.1 F (36.2 C) (Temporal)   Resp 15   Ht 5\' 8"  (1.727 m)   Wt 139 lb (63 kg)   SpO2 98%   BMI 21.13 kg/m    Patient alert not oriented to plave and time, d and in no cardiopulmonary distress.  HEENT: No facial asymmetry, EOMI,   oropharynx pink and moist.  Neck supple no JVD, no mass.  Chest: Clear to auscultation bilaterally.  CVS: S1, S2 no murmurs, no S3.Regular rate.  ABD: Soft non tender.   Ext: No edema  MS: Adequate ROM spine, shoulders, hips and reduced in left  knee.  Skin: Intact, no ulcerations or rash noted.  Psych: Good eye contact, normal affect. Memory markedly impaired not anxious or depressed appearing.  CNS: CN 2-12 intact, power,  normal throughout.no focal deficits noted.   Assessment & Plan  Dementia ([place]) Severe and deteriorating, speaking of people  wanting her  Property and unable to tell the month,date ,  year or season Not taking medication regularly.  Osteoporosis Needs to take once weekly fosamax and daily calcium  Primary osteoarthritis of left knee Denies any falls, gets intra articular injections occasionally for Ortho  Hyperlipidemia LDL goal <100 Hyperlipidemia:Low fat diet discussed and encouraged.   Lipid Panel  Lab Results  Component Value Date   CHOL 220 (H) 05/29/2018   HDL 94 05/29/2018   LDLCALC 111 (H) 05/29/2018   TRIG 64 05/29/2018   CHOLHDL 2.3 05/29/2018  low fat diet encouraged

## 2019-01-02 ENCOUNTER — Other Ambulatory Visit: Payer: Self-pay

## 2019-01-02 ENCOUNTER — Ambulatory Visit (INDEPENDENT_AMBULATORY_CARE_PROVIDER_SITE_OTHER): Payer: Medicare Other

## 2019-01-02 DIAGNOSIS — Z23 Encounter for immunization: Secondary | ICD-10-CM

## 2019-01-04 NOTE — Assessment & Plan Note (Signed)
Hyperlipidemia:Low fat diet discussed and encouraged.   Lipid Panel  Lab Results  Component Value Date   CHOL 220 (H) 05/29/2018   HDL 94 05/29/2018   LDLCALC 111 (H) 05/29/2018   TRIG 64 05/29/2018   CHOLHDL 2.3 05/29/2018  low fat diet encouraged

## 2019-01-16 ENCOUNTER — Ambulatory Visit: Payer: Medicare Other | Admitting: Family Medicine

## 2019-01-24 ENCOUNTER — Encounter (INDEPENDENT_AMBULATORY_CARE_PROVIDER_SITE_OTHER): Payer: Self-pay

## 2019-01-24 ENCOUNTER — Ambulatory Visit (INDEPENDENT_AMBULATORY_CARE_PROVIDER_SITE_OTHER): Payer: Medicare Other | Admitting: Family Medicine

## 2019-01-24 ENCOUNTER — Other Ambulatory Visit: Payer: Self-pay

## 2019-01-24 ENCOUNTER — Encounter: Payer: Self-pay | Admitting: Family Medicine

## 2019-01-24 VITALS — BP 122/72 | HR 64 | Temp 98.1°F | Resp 15 | Ht 68.0 in | Wt 136.0 lb

## 2019-01-24 DIAGNOSIS — G301 Alzheimer's disease with late onset: Secondary | ICD-10-CM

## 2019-01-24 DIAGNOSIS — M810 Age-related osteoporosis without current pathological fracture: Secondary | ICD-10-CM | POA: Diagnosis not present

## 2019-01-24 DIAGNOSIS — M1712 Unilateral primary osteoarthritis, left knee: Secondary | ICD-10-CM | POA: Diagnosis not present

## 2019-01-24 DIAGNOSIS — F0281 Dementia in other diseases classified elsewhere with behavioral disturbance: Secondary | ICD-10-CM | POA: Diagnosis not present

## 2019-01-24 DIAGNOSIS — J3089 Other allergic rhinitis: Secondary | ICD-10-CM

## 2019-01-24 NOTE — Patient Instructions (Signed)
F/U in January, call if you need me sooner  New additional medication tylenol pm at bedtime for help with arthritis and sleep  Thanks for choosing [facility], we consider it a privelige to serve you.

## 2019-01-24 NOTE — Progress Notes (Signed)
   Rose [name]     MRN: [id number]      DOB: 1935/03/30   HPI Rose [name] is here with her sister Rose [name], who is concerned re c/o headache this morning and feeling light headed. Pt denies both but has severe dementia, disoriented x 4 . Rose [name] denies pain and states she feels well. Rose [name] reports that times Rose [name] gets easily agitiateds and wanders at night, when she started saying this Rose [name] got visible angry and the entire mood of the visit changed from very pleasant to unpleasant There is no h/o fever, chills , cough or malodorous urine. Family would like pt to increase intake  ROS History as above, and primarily from sibling Denies recent fever or chills. Denies sinus pressure, nasal congestion, ear pain or sore throat. Denies chest congestion, productive cough or wheezing. Denies chest pains, palpitations and leg swelling Denies abdominal pain, nausea, vomiting,diarrhea or constipation.   Denies dysuria, frequency, hesitancy or incontinence. C/o left knee pain and swelling intermittently. Denies depression, c/o  anxiety and insomnia. Denies skin break down or rash.   PE  BP 122/72   Pulse 64   Temp 98.1 F (36.7 C) (Temporal)   Resp 15   Ht 5\' 8"  (1.727 m)   Wt 136 lb (61.7 kg)   SpO2 96%   BMI 20.68 kg/m   Patient alert and disoriented x 4  and in no cardiopulmonary distress.  HEENT: No facial asymmetry, EOMI,    .  Neck supple no JVD, no mass.  Chest: Clear to auscultation bilaterally.  CVS: S1, S2 no murmurs, no S3.Regular rate.  ABD: Soft non tender.   Ext: No edema  MS: Adequate ROM spine, shoulders, hips and reduced in left  knee.  Skin: Intact, no ulcerations or rash noted.  Psych: Good eye contact, labile affect. Memory loss severe CNS: CN 2-12 intact, power,  normal throughout.no focal deficits noted.   Assessment & Plan  Dementia with behavioral disturbance ([place]) Increased agitation noted in the home with behavioral issues, getting angry  easily. No physical threat to sibling who lives with her Poor sleep noted, will add tylenol pm and monitor response, continue Aricept as before  Allergic rhinitis Intermittent sneezing and runny nose, addition of benadryl will help  Primary osteoarthritis of left knee Chronic and unchanged, receives intra articular injections sporadically, fall risk reduction discussed  Osteoporosis Continue weekly fosamax and daily calcium with D, as well as weight bearing exercise

## 2019-01-25 ENCOUNTER — Encounter: Payer: Self-pay | Admitting: Family Medicine

## 2019-01-25 DIAGNOSIS — F03918 Unspecified dementia, unspecified severity, with other behavioral disturbance: Secondary | ICD-10-CM | POA: Insufficient documentation

## 2019-01-25 DIAGNOSIS — F0391 Unspecified dementia with behavioral disturbance: Secondary | ICD-10-CM | POA: Insufficient documentation

## 2019-01-25 NOTE — Assessment & Plan Note (Signed)
Chronic and unchanged, receives intra articular injections sporadically, fall risk reduction discussed

## 2019-01-25 NOTE — Assessment & Plan Note (Signed)
Increased agitation noted in the home with behavioral issues, getting angry easily. No physical threat to sibling who lives with her Poor sleep noted, will add tylenol pm and monitor response, continue Aricept as before

## 2019-01-25 NOTE — Assessment & Plan Note (Signed)
Continue weekly fosamax and daily calcium with D, as well as weight bearing exercise

## 2019-01-25 NOTE — Assessment & Plan Note (Signed)
Intermittent sneezing and runny nose, addition of benadryl will help

## 2019-05-29 DIAGNOSIS — Z23 Encounter for immunization: Secondary | ICD-10-CM | POA: Diagnosis not present

## 2019-06-03 ENCOUNTER — Ambulatory Visit: Payer: Medicare Other

## 2019-06-04 ENCOUNTER — Encounter: Payer: Medicare Other | Admitting: Family Medicine

## 2019-06-04 DIAGNOSIS — E559 Vitamin D deficiency, unspecified: Secondary | ICD-10-CM | POA: Diagnosis not present

## 2019-06-04 DIAGNOSIS — E785 Hyperlipidemia, unspecified: Secondary | ICD-10-CM | POA: Diagnosis not present

## 2019-06-07 ENCOUNTER — Encounter: Payer: Medicare Other | Admitting: Family Medicine

## 2019-06-10 ENCOUNTER — Other Ambulatory Visit: Payer: Self-pay

## 2019-06-10 ENCOUNTER — Encounter (INDEPENDENT_AMBULATORY_CARE_PROVIDER_SITE_OTHER): Payer: Self-pay | Admitting: *Deleted

## 2019-06-10 ENCOUNTER — Encounter: Payer: Self-pay | Admitting: Family Medicine

## 2019-06-10 ENCOUNTER — Ambulatory Visit (INDEPENDENT_AMBULATORY_CARE_PROVIDER_SITE_OTHER): Payer: Medicare Other | Admitting: Family Medicine

## 2019-06-10 VITALS — BP 138/82 | HR 56 | Temp 97.6°F | Resp 15 | Ht 68.0 in | Wt 143.0 lb

## 2019-06-10 DIAGNOSIS — G301 Alzheimer's disease with late onset: Secondary | ICD-10-CM

## 2019-06-10 DIAGNOSIS — Z1211 Encounter for screening for malignant neoplasm of colon: Secondary | ICD-10-CM | POA: Diagnosis not present

## 2019-06-10 DIAGNOSIS — E559 Vitamin D deficiency, unspecified: Secondary | ICD-10-CM | POA: Diagnosis not present

## 2019-06-10 DIAGNOSIS — M810 Age-related osteoporosis without current pathological fracture: Secondary | ICD-10-CM

## 2019-06-10 DIAGNOSIS — F0281 Dementia in other diseases classified elsewhere with behavioral disturbance: Secondary | ICD-10-CM | POA: Diagnosis not present

## 2019-06-10 DIAGNOSIS — R7989 Other specified abnormal findings of blood chemistry: Secondary | ICD-10-CM

## 2019-06-10 DIAGNOSIS — Z1231 Encounter for screening mammogram for malignant neoplasm of breast: Secondary | ICD-10-CM

## 2019-06-10 LAB — TSH: TSH: 8.34 mIU/L — ABNORMAL HIGH (ref 0.40–4.50)

## 2019-06-10 LAB — TEST AUTHORIZATION

## 2019-06-10 LAB — COMPLETE METABOLIC PANEL WITH GFR
AG Ratio: 1.7 (calc) (ref 1.0–2.5)
ALT: 11 U/L (ref 6–29)
AST: 20 U/L (ref 10–35)
Albumin: 4 g/dL (ref 3.6–5.1)
Alkaline phosphatase (APISO): 123 U/L (ref 37–153)
BUN: 10 mg/dL (ref 7–25)
CO2: 31 mmol/L (ref 20–32)
Calcium: 9.5 mg/dL (ref 8.6–10.4)
Chloride: 103 mmol/L (ref 98–110)
Creat: 0.88 mg/dL (ref 0.60–0.88)
GFR, Est African American: 71 mL/min/{1.73_m2} (ref >=60)
GFR, Est Non African American: 61 mL/min/{1.73_m2} (ref >=60)
Globulin: 2.4 g/dL (calc) (ref 1.9–3.7)
Glucose, Bld: 102 mg/dL — ABNORMAL HIGH (ref 65–99)
Potassium: 3.8 mmol/L (ref 3.5–5.3)
Sodium: 141 mmol/L (ref 135–146)
Total Bilirubin: 0.5 mg/dL (ref 0.2–1.2)
Total Protein: 6.4 g/dL (ref 6.1–8.1)

## 2019-06-10 LAB — T4, FREE: Free T4: 1.1 ng/dL (ref 0.8–1.8)

## 2019-06-10 LAB — CBC
HCT: 40.3 % (ref 35.0–45.0)
Hemoglobin: 13.4 g/dL (ref 11.7–15.5)
MCH: 29.1 pg (ref 27.0–33.0)
MCHC: 33.3 g/dL (ref 32.0–36.0)
MCV: 87.6 fL (ref 80.0–100.0)
MPV: 10.6 fL (ref 7.5–12.5)
Platelets: 236 10*3/uL (ref 140–400)
RBC: 4.6 10*6/uL (ref 3.80–5.10)
RDW: 12.2 % (ref 11.0–15.0)
WBC: 4.2 10*3/uL (ref 3.8–10.8)

## 2019-06-10 LAB — LIPID PANEL
Cholesterol: 207 mg/dL — ABNORMAL HIGH (ref <200)
HDL: 90 mg/dL (ref >=50)
LDL Cholesterol (Calc): 101 mg/dL (calc) — ABNORMAL HIGH
Non-HDL Cholesterol (Calc): 117 mg/dL (calc) (ref <130)
Total CHOL/HDL Ratio: 2.3 (calc) (ref <5.0)
Triglycerides: 69 mg/dL (ref <150)

## 2019-06-10 LAB — VITAMIN D 25 HYDROXY (VIT D DEFICIENCY, FRACTURES): Vit D, 25-Hydroxy: 22 ng/mL — ABNORMAL LOW (ref 30–100)

## 2019-06-10 LAB — T3, FREE: T3, Free: 2.9 pg/mL (ref 2.3–4.2)

## 2019-06-10 NOTE — Assessment & Plan Note (Signed)
Add free T 3, free T4, may need supplement

## 2019-06-10 NOTE — Assessment & Plan Note (Signed)
Needs once weekly fosamax, need to check with family if taking medication consitently

## 2019-06-10 NOTE — Progress Notes (Signed)
   VANEZZA CORR     MRN: [id number]      DOB: 05-22-1934   HPI Ms. Brisky is here for follow up and re-evaluation of chronic medical conditions, medication management and review of any available recent lab and radiology data.  Preventive health is updated, specifically  Cancer screening and Immunization.   There are no new concerns.  There are no specific complaints   ROS Denies recent fever or chills. Denies sinus pressure, nasal congestion, ear pain or sore throat. Denies chest congestion, productive cough or wheezing. Denies chest pains, palpitations and leg swelling Denies abdominal pain, nausea, vomiting,diarrhea or constipation.   Denies dysuria, frequency, hesitancy or incontinence. Denies joint pain, swelling and limitation in mobility. Denies headaches, seizures, numbness, or tingling. Denies depression, anxiety or insomnia. Denies skin break down or rash.   PE  BP 138/82   Pulse (!) 56   Temp 97.6 F (36.4 C) (Temporal)   Resp 15   Ht 5\' 8"  (1.727 m)   Wt 143 lb 0.6 oz (64.9 kg)   SpO2 96%   BMI 21.75 kg/m    Patient alert disoriented and in no cardiopulmonary distress.  Speaking excessively about little children who she loves and who love her  HEENT: No facial asymmetry, EOMI,     Neck supple .  Chest: Clear to auscultation bilaterally.  CVS: S1, S2 no murmurs, no S3.Regular rate.  ABD: Soft non tender.   Ext: No edema  MS: Adequate ROM spine, shoulders, hips and knees.  Skin: Intact, no ulcerations or rash noted.  Psych: Good eye contact, normal affect. Memory impaired, confusd not anxious or depressed appearing.  CNS: CN 2-12 intact, power,  normal throughout.no focal deficits noted.   Assessment & Plan  Abnormal TSH Add free T 3, free T4, may need supplement  Osteoporosis Needs once weekly fosamax, need to check with family if taking medication consitently  Vitamin D deficiency Updated lab needed at/ before next  visit.   Dementia with behavioral disturbance ([place]) Severe and deteriorating, will discuss situation at home with responsible sibling after visit briefly

## 2019-06-10 NOTE — Assessment & Plan Note (Signed)
Severe and deteriorating, will discuss situation at home with responsible sibling after visit briefly

## 2019-06-10 NOTE — Patient Instructions (Addendum)
F/u in office with MD in 6 months, call if you need me before  Please schedule mammogram at checkout  You are referred to Dr Laural Golden for colonoscopy which is past due   PLEASE commit to taking aricept daily and once weekly fosamax  Free T3 and free T4 and vit D are added to recent labs

## 2019-06-10 NOTE — Assessment & Plan Note (Signed)
Updated lab needed at/ before next visit.   

## 2019-06-12 ENCOUNTER — Other Ambulatory Visit: Payer: Self-pay

## 2019-06-12 ENCOUNTER — Encounter: Payer: Self-pay | Admitting: Family Medicine

## 2019-06-12 ENCOUNTER — Ambulatory Visit (INDEPENDENT_AMBULATORY_CARE_PROVIDER_SITE_OTHER): Payer: Medicare Other | Admitting: Family Medicine

## 2019-06-12 VITALS — BP 138/82 | HR 56 | Resp 15 | Ht 68.0 in | Wt 143.0 lb

## 2019-06-12 DIAGNOSIS — Z0000 Encounter for general adult medical examination without abnormal findings: Secondary | ICD-10-CM

## 2019-06-12 NOTE — Patient Instructions (Signed)
Rose [name] , Thank you for taking time to come for your Medicare Wellness Visit. I appreciate your ongoing commitment to your health goals. Please review the following plan we discussed and let me know if I can assist you in the future.   Please continue to practice social distancing to keep you, your family, and our community safe.  If you must go out, please wear a Mask and practice good handwashing.  Screening recommendations/referrals: Colonoscopy: no longer needed Mammogram: up to date Bone Density: up to date Recommended yearly ophthalmology/optometry visit for glaucoma screening and checkup Recommended yearly dental visit for hygiene and checkup  Vaccinations: Influenza vaccine:  up to date Pneumococcal vaccine:  up to date Tdap vaccine:  up to date Shingles vaccine: completed   Advanced directives: declined  Conditions/risks identified: Falls   Next appointment: 12/09/2019   Preventive Care 83 Years and Older, Female Preventive care refers to lifestyle choices and visits with your health care provider that can promote health and wellness. What does preventive care include?  A yearly physical exam. This is also called an annual well check.  Dental exams once or twice a year.  Routine eye exams. Ask your health care provider how often you should have your eyes checked.  Personal lifestyle choices, including:  Daily care of your teeth and gums.  Regular physical activity.  Eating a healthy diet.  Avoiding tobacco and drug use.  Limiting alcohol use.  Practicing safe sex.  Taking low-dose aspirin every day.  Taking vitamin and mineral supplements as recommended by your health care provider. What happens during an annual well check? The services and screenings done by your health care provider during your annual well check will depend on your age, overall health, lifestyle risk factors, and family history of disease. Counseling  Your health care provider may  ask you questions about your:  Alcohol use.  Tobacco use.  Drug use.  Emotional well-being.  Home and relationship well-being.  Sexual activity.  Eating habits.  History of falls.  Memory and ability to understand (cognition).  Work and work Statistician.  Reproductive health. Screening  You may have the following tests or measurements:  Height, weight, and BMI.  Blood pressure.  Lipid and cholesterol levels. These may be checked every 5 years, or more frequently if you are over 53 years old.  Skin check.  Lung cancer screening. You may have this screening every year starting at age 17 if you have a 30-pack-year history of smoking and currently smoke or have quit within the past 15 years.  Fecal occult blood test (FOBT) of the stool. You may have this test every year starting at age 74.  Flexible sigmoidoscopy or colonoscopy. You may have a sigmoidoscopy every 5 years or a colonoscopy every 10 years starting at age 35.  Hepatitis C blood test.  Hepatitis B blood test.  Sexually transmitted disease (STD) testing.  Diabetes screening. This is done by checking your blood sugar (glucose) after you have not eaten for a while (fasting). You may have this done every 1-3 years.  Bone density scan. This is done to screen for osteoporosis. You may have this done starting at age 41.  Mammogram. This may be done every 1-2 years. Talk to your health care provider about how often you should have regular mammograms. Talk with your health care provider about your test results, treatment options, and if necessary, the need for more tests. Vaccines  Your health care provider may recommend certain vaccines,  such as:  Influenza vaccine. This is recommended every year.  Tetanus, diphtheria, and acellular pertussis (Tdap, Td) vaccine. You may need a Td booster every 10 years.  Zoster vaccine. You may need this after age 48.  Pneumococcal 13-valent conjugate (PCV13) vaccine. One  dose is recommended after age 59.  Pneumococcal polysaccharide (PPSV23) vaccine. One dose is recommended after age 42. Talk to your health care provider about which screenings and vaccines you need and how often you need them. This information is not intended to replace advice given to you by your health care provider. Make sure you discuss any questions you have with your health care provider. Document Released: 05/29/2015 Document Revised: 01/20/2016 Document Reviewed: 03/03/2015 Elsevier Interactive Patient Education  2017 [place] Prevention in the Home Falls can cause injuries. They can happen to people of all ages. There are many things you can do to make your home safe and to help prevent falls. What can I do on the outside of my home?  Regularly fix the edges of walkways and driveways and fix any cracks.  Remove anything that might make you trip as you walk through a door, such as a raised step or threshold.  Trim any bushes or trees on the path to your home.  Use bright outdoor lighting.  Clear any walking paths of anything that might make someone trip, such as rocks or tools.  Regularly check to see if handrails are loose or broken. Make sure that both sides of any steps have handrails.  Any raised decks and porches should have guardrails on the edges.  Have any leaves, snow, or ice cleared regularly.  Use sand or salt on walking paths during winter.  Clean up any spills in your garage right away. This includes oil or grease spills. What can I do in the bathroom?  Use night lights.  Install grab bars by the toilet and in the tub and shower. Do not use towel bars as grab bars.  Use non-skid mats or decals in the tub or shower.  If you need to sit down in the shower, use a plastic, non-slip stool.  Keep the floor dry. Clean up any water that spills on the floor as soon as it happens.  Remove soap buildup in the tub or shower regularly.  Attach bath  mats securely with double-sided non-slip rug tape.  Do not have throw rugs and other things on the floor that can make you trip. What can I do in the bedroom?  Use night lights.  Make sure that you have a light by your bed that is easy to reach.  Do not use any sheets or blankets that are too big for your bed. They should not hang down onto the floor.  Have a firm chair that has side arms. You can use this for support while you get dressed.  Do not have throw rugs and other things on the floor that can make you trip. What can I do in the kitchen?  Clean up any spills right away.  Avoid walking on wet floors.  Keep items that you use a lot in easy-to-reach places.  If you need to reach something above you, use a strong step stool that has a grab bar.  Keep electrical cords out of the way.  Do not use floor polish or wax that makes floors slippery. If you must use wax, use non-skid floor wax.  Do not have throw rugs and other things on  the floor that can make you trip. What can I do with my stairs?  Do not leave any items on the stairs.  Make sure that there are handrails on both sides of the stairs and use them. Fix handrails that are broken or loose. Make sure that handrails are as long as the stairways.  Check any carpeting to make sure that it is firmly attached to the stairs. Fix any carpet that is loose or worn.  Avoid having throw rugs at the top or bottom of the stairs. If you do have throw rugs, attach them to the floor with carpet tape.  Make sure that you have a light switch at the top of the stairs and the bottom of the stairs. If you do not have them, ask someone to add them for you. What else can I do to help prevent falls?  Wear shoes that:  Do not have high heels.  Have rubber bottoms.  Are comfortable and fit you well.  Are closed at the toe. Do not wear sandals.  If you use a stepladder:  Make sure that it is fully opened. Do not climb a closed  stepladder.  Make sure that both sides of the stepladder are locked into place.  Ask someone to hold it for you, if possible.  Clearly mark and make sure that you can see:  Any grab bars or handrails.  First and last steps.  Where the edge of each step is.  Use tools that help you move around (mobility aids) if they are needed. These include:  Canes.  Walkers.  Scooters.  Crutches.  Turn on the lights when you go into a dark area. Replace any light bulbs as soon as they burn out.  Set up your furniture so you have a clear path. Avoid moving your furniture around.  If any of your floors are uneven, fix them.  If there are any pets around you, be aware of where they are.  Review your medicines with your doctor. Some medicines can make you feel dizzy. This can increase your chance of falling. Ask your doctor what other things that you can do to help prevent falls. This information is not intended to replace advice given to you by your health care provider. Make sure you discuss any questions you have with your health care provider. Document Released: 02/26/2009 Document Revised: 10/08/2015 Document Reviewed: 06/06/2014 Elsevier Interactive Patient Education  2017 [employer].

## 2019-06-12 NOTE — Progress Notes (Signed)
Subjective:   Rose [name] is a 85 y.o. female who presents for Medicare Annual (Subsequent) preventive examination.  Location of Patient: Home Location of Provider: Telehealth Consent was obtain for visit to be over via telehealth.  I verified that I am speaking with the correct person using two identifiers.  Review of Systems:    Cardiac Risk Factors include: advanced age (>14men, >33 women)     Objective:     Vitals: BP 138/82   Pulse (!) 56   Resp 15   Ht 5\' 8"  (1.727 m)   Wt 143 lb (64.9 kg)   BMI 21.74 kg/m   Body mass index is 21.74 kg/m.  Advanced Directives 05/28/2018  Does Patient Have a Medical Advance Directive? Yes  Type of Advance Directive Living will  Does patient want to make changes to medical advance directive? No - Patient declined    Tobacco Social History   Tobacco Use  Smoking Status Never Smoker  Smokeless Tobacco Never Used     Counseling given: Yes   Clinical Intake:  Pre-visit preparation completed: Yes  Pain : No/denies pain Pain Score: 0-No pain     BMI - recorded: 21.74 Nutritional Status: BMI of 19-24  Normal Nutritional Risks: None Diabetes: No  How often do you need to have someone help you when you read instructions, pamphlets, or other written materials from your doctor or pharmacy?: 1 - Never What is the last grade level you completed in school?: 12  Interpreter Needed?: No     Past Medical History:  Diagnosis Date  . Chronic anxiety   . Dyslipidemia   . Hypothyroidism   . Palpitations    Frequent PACs by Holter monitor June 2011   Past Surgical History:  Procedure Laterality Date  . ABDOMINAL HYSTERECTOMY    . APPENDECTOMY    . CHOLECYSTECTOMY    . COLONOSCOPY  09/08/2011   Procedure: COLONOSCOPY;  Surgeon: Rogene Houston, MD;  Location: [facility];  Service: Endoscopy;  Laterality: N/A;  2:10  . PARTIAL HYSTERECTOMY     Family History  Problem Relation Age of Onset  . Colon cancer Mother    . Colon cancer Father   . Heart disease Father   . Cancer Brother 51       prostate   . Hypertension Sister   . Hypertension Sister    Social History   Socioeconomic History  . Marital status: Widowed    Spouse name: Not on file  . Number of children: 0  . Years of education: 12+  . Highest education level: Associate degree: occupational, Hotel manager, or vocational program  Occupational History  . Occupation: retired   Tobacco Use  . Smoking status: Never Smoker  . Smokeless tobacco: Never Used  Substance and Sexual Activity  . Alcohol use: No  . Drug use: No  . Sexual activity: Not Currently  Other Topics Concern  . Not on file  Social History Narrative   Sister from up [place] is currently living with her    Social Determinants of Health   Financial Resource Strain:   . Difficulty of Paying Living Expenses: Not on file  Food Insecurity:   . Worried About Charity fundraiser in the Last Year: Not on file  . Ran Out of Food in the Last Year: Not on file  Transportation Needs:   . Lack of Transportation (Medical): Not on file  . Lack of Transportation (Non-Medical): Not on file  Physical Activity:   .  Days of Exercise per Week: Not on file  . Minutes of Exercise per Session: Not on file  Stress:   . Feeling of Stress : Not on file  Social Connections:   . Frequency of Communication with Friends and Family: Not on file  . Frequency of Social Gatherings with Friends and Family: Not on file  . Attends Religious Services: Not on file  . Active Member of Clubs or Organizations: Not on file  . Attends Archivist Meetings: Not on file  . Marital Status: Not on file    Outpatient Encounter Medications as of 06/12/2019  Medication Sig  . alendronate (FOSAMAX) 70 MG tablet Take 1 tablet (70 mg total) by mouth every 7 (seven) days. Take with a full glass of water on an empty stomach.  . Calcium 500-125 MG-UNIT TABS Take by mouth 3 (three) times daily.    .  diphenhydramine-acetaminophen (TYLENOL PM) 25-500 MG TABS tablet Take 1 tablet by mouth at bedtime as needed.  . donepezil (ARICEPT) 10 MG tablet Take 1 tablet (10 mg total) by mouth at bedtime.   No facility-administered encounter medications on file as of 06/12/2019.    Activities of Daily Living In your present state of health, do you have any difficulty performing the following activities: 06/12/2019  Hearing? N  Vision? N  Difficulty concentrating or making decisions? N  Walking or climbing stairs? N  Dressing or bathing? N  Doing errands, shopping? N  Preparing Food and eating ? N  Using the Toilet? N  In the past six months, have you accidently leaked urine? N  Do you have problems with loss of bowel control? N  Managing your Medications? N  Managing your Finances? N  Housekeeping or managing your Housekeeping? N  Some recent data might be hidden    Patient Care Team: Fayrene Helper, MD as PCP - General Lowella Bandy, MD as Attending Physician (Urology) Rutherford Guys, MD as Attending Physician (Ophthalmology) Satira Sark, MD as Consulting Physician (Cardiology) Carole Civil, MD as Consulting Physician (Orthopedic Surgery)    Assessment:   This is a routine wellness examination for [place].  Exercise Activities and Dietary recommendations Current Exercise Habits: Home exercise routine, Type of exercise: walking, Time (Minutes): 40, Frequency (Times/Week): 2, Weekly Exercise (Minutes/Week): 80, Intensity: Mild, Exercise limited by: None identified  Goals    . DIET - EAT MORE FRUITS AND VEGETABLES    . Increase physical activity       Fall Risk Fall Risk  06/12/2019 06/10/2019 01/24/2019 12/31/2018 09/27/2018  Falls in the past year? 0 0 0 0 0  Comment - - - - -  Number falls in past yr: 0 0 0 0 0  Injury with Fall? 0 0 0 0 0   Is the patient's home free of loose throw rugs in walkways, pet beds, electrical cords, etc?   yes      Grab bars in the  bathroom? yes      Handrails on the stairs?   yes      Adequate lighting?   yes   Depression Screen PHQ 2/9 Scores 06/12/2019 06/10/2019 12/31/2018 05/28/2018  PHQ - 2 Score 0 0 0 1  PHQ- 9 Score - - - -     Cognitive Function MMSE - Mini Mental State Exam 06/10/2019 05/28/2018 10/19/2016  Not completed: - Refused -  Orientation to time 0 0 5  Orientation to Place 5 1 3   Registration 0 0 2  Attention/ Calculation 3 0 1  Recall 0 0 2  Language- name 2 objects 2 0 1  Language- repeat 0 1 1  Language- follow 3 step command 1 1 3   Language- read & follow direction 0 0 1  Write a sentence 1 0 1  Copy design 1 0 0  Total score 13 3 20      6CIT Screen 06/12/2019 05/28/2018  What Year? 4 points 4 points  What month? 3 points 3 points  What time? 0 points 0 points  Count back from 20 4 points 4 points  Months in reverse 4 points 4 points  Repeat phrase 10 points 10 points  Total Score 25 25    Immunization History  Administered Date(s) Administered  . Fluad Quad(high Dose 65+) 01/02/2019  . Influenza Split 01/08/2014  . Influenza Whole 02/14/2007, 01/18/2009, 02/11/2010  . Influenza,inj,Quad PF,6+ Mos 01/07/2015, 02/12/2018  . Influenza-Unspecified 02/15/2017  . Pneumococcal Conjugate-13 11/25/2013  . Pneumococcal Polysaccharide-23 03/30/2004  . Td 01/21/2004  . Zoster 07/12/2006    Qualifies for Shingles Vaccine? completed  Screening Tests Health Maintenance  Topic Date Due  . COLONOSCOPY  09/07/2016  . TETANUS/TDAP  06/09/2020 (Originally 01/20/2014)  . INFLUENZA VACCINE  Completed  . DEXA SCAN  Completed  . PNA vac Low Risk Adult  Completed    Cancer Screenings: Lung: Low Dose CT Chest recommended if Age 36-80 years, 30 pack-year currently smoking OR have quit w/in 15years. Patient does not qualify. Breast:  Up to date on Mammogram? Yes   Up to date of Bone Density/Dexa? Yes Colorectal:  N/a   Additional Screenings:   Hepatitis C Screening:      Plan:      1.  Encounter for Medicare annual wellness exam   I have personally reviewed and noted the following in the patient's chart:   . Medical and social history . Use of alcohol, tobacco or illicit drugs  . Current medications and supplements . Functional ability and status . Nutritional status . Physical activity . Advanced directives . List of other physicians . Hospitalizations, surgeries, and ER visits in previous 12 months . Vitals . Screenings to include cognitive, depression, and falls . Referrals and appointments  In addition, I have reviewed and discussed with patient certain preventive protocols, quality metrics, and best practice recommendations. A written personalized care plan for preventive services as well as general preventive health recommendations were provided to patient.    I provided 20 minutes of non-face-to-face time during this encounter.   Perlie Mayo, NP  06/12/2019

## 2019-06-21 ENCOUNTER — Ambulatory Visit (HOSPITAL_COMMUNITY): Payer: Medicare Other

## 2019-06-26 DIAGNOSIS — Z23 Encounter for immunization: Secondary | ICD-10-CM | POA: Diagnosis not present

## 2019-07-03 ENCOUNTER — Encounter: Payer: Self-pay | Admitting: Family Medicine

## 2019-07-03 ENCOUNTER — Other Ambulatory Visit: Payer: Self-pay

## 2019-07-03 ENCOUNTER — Ambulatory Visit (INDEPENDENT_AMBULATORY_CARE_PROVIDER_SITE_OTHER): Payer: Medicare Other | Admitting: Family Medicine

## 2019-07-03 VITALS — BP 140/78 | HR 71 | Temp 97.8°F | Resp 15 | Ht 65.0 in | Wt 141.0 lb

## 2019-07-03 DIAGNOSIS — G301 Alzheimer's disease with late onset: Secondary | ICD-10-CM

## 2019-07-03 DIAGNOSIS — Y92009 Unspecified place in unspecified non-institutional (private) residence as the place of occurrence of the external cause: Secondary | ICD-10-CM

## 2019-07-03 DIAGNOSIS — W19XXXA Unspecified fall, initial encounter: Secondary | ICD-10-CM | POA: Diagnosis not present

## 2019-07-03 DIAGNOSIS — F0281 Dementia in other diseases classified elsewhere with behavioral disturbance: Secondary | ICD-10-CM | POA: Diagnosis not present

## 2019-07-03 NOTE — Patient Instructions (Addendum)
Happy New Year! May you have a year filled with hope, love, happiness and laughter.  I appreciate the opportunity to provide you with care for your health and wellness. Today we discussed: fall  Follow up: 12/09/2019 as scheduled  No labs or referrals today  GOALS:  Enjoy each others time. Avoid conflict over sleep, food, and the alike. Allow her to eat what she enjoys, you can supplement with ensure or boost.  Try and make the home safe by avoiding cords in the walk ways, remove throw rugs, lock doors and windows at night if possible.   Exercise: walking is very healthy and can promote good sleep and eating.  Sunshine is also helpful, maybe sitting outside on nice days for a few minutes, feed the birds or plant some flowers to watch grow.  Listen to favorite songs and watch favorite shows.  Please continue to practice social distancing to keep you, your family, and our community safe.  If you must go out, please wear a mask and practice good handwashing.  It was a pleasure to see you and I look forward to continuing to work together on your health and well-being. Please do not hesitate to call the office if you need care or have questions about your care.  Have a wonderful day and week. With Gratitude, Cherly Beach, DNP, AGNP-BC

## 2019-07-03 NOTE — Assessment & Plan Note (Signed)
Encouraged to make sure that the home is safe as possible for her. No injury is noted today.  PE is notable for crepitus in bilateral knees.  No wounds bruising or deformities noted.

## 2019-07-03 NOTE — Progress Notes (Signed)
Subjective:  Patient ID: Rose [name], female    DOB: Feb 02, 1935  Age: 84 y.o. MRN: [id number]  CC:  Chief Complaint  Patient presents with  . Fall    left knee is hurting. pt states her back is not hurting.       HPI  HPI Ms. Radhakrishnan is a 85 year old female patient who presents today after having a fall on Saturday, February 13.  Fall was unwitnessed they found her down on the ground in a living room.  Lives with sister who is unavailable to come in today.  Other sister who lives across the street is here with her today.  They report that they have a lot of trouble getting her to eat at times take her medicines at times.  Reports that she does not sleep very well.  Worsening dementia with behavioral disturbance.  Has been taking Aricept but unsure of the actual consistency of taking this. She reports that she has not had a fall and she does not know what her sister is talking about.  And is very frustrated with the conversation today.  Today patient denies signs and symptoms of COVID 19 infection including fever, chills, cough, shortness of breath, and headache. Past Medical, Surgical, Social History, Allergies, and Medications have been Reviewed.   Past Medical History:  Diagnosis Date  . Chronic anxiety   . Dyslipidemia   . Hypothyroidism   . Palpitations    Frequent PACs by Holter monitor June 2011    Current Meds  Medication Sig  . alendronate (FOSAMAX) 70 MG tablet Take 1 tablet (70 mg total) by mouth every 7 (seven) days. Take with a full glass of water on an empty stomach.  . Calcium 500-125 MG-UNIT TABS Take by mouth 3 (three) times daily.    . diphenhydramine-acetaminophen (TYLENOL PM) 25-500 MG TABS tablet Take 1 tablet by mouth at bedtime as needed.  . donepezil (ARICEPT) 10 MG tablet Take 1 tablet (10 mg total) by mouth at bedtime.    ROS:  Review of Systems  Constitutional: Negative.   HENT: Negative.   Eyes: Negative.   Respiratory: Negative.     Cardiovascular: Negative.   Gastrointestinal: Negative.   Genitourinary: Negative.   Musculoskeletal: Negative.   Skin: Negative.   Neurological: Negative.   Endo/Heme/Allergies: Negative.   Psychiatric/Behavioral: Negative.   All other systems reviewed and are negative.    Objective:   Today's Vitals: BP 140/78   Pulse 71   Temp 97.8 F (36.6 C) (Temporal)   Resp 15   Ht 5\' 5"  (1.651 m)   Wt 141 lb 0.6 oz (64 kg)   SpO2 96%   BMI 23.47 kg/m  Vitals with BMI 07/03/2019 06/12/2019 06/10/2019  Height 5\' 5"  5\' 8"  -  Weight 141 lbs 1 oz 143 lbs -  BMI 0000000 [id number] -  Systolic [id number] 0000000 0000000  Diastolic 78 82 82  Pulse 71 56 -     Physical Exam Vitals and nursing note reviewed.  Constitutional:      Appearance: Normal appearance. She is normal weight.  HENT:     Head: Normocephalic and atraumatic.     Right Ear: External ear normal.     Left Ear: External ear normal.     Nose: Nose normal.     Mouth/Throat:     Pharynx: Oropharynx is clear.  Eyes:     General:        Right eye: No discharge.  Left eye: No discharge.     Conjunctiva/sclera: Conjunctivae normal.  Cardiovascular:     Rate and Rhythm: Normal rate and regular rhythm.     Pulses: Normal pulses.     Heart sounds: Normal heart sounds.  Pulmonary:     Effort: Pulmonary effort is normal.     Breath sounds: Normal breath sounds.  Musculoskeletal:        General: Deformity present. No swelling, tenderness or signs of injury.     Cervical back: Normal range of motion and neck supple.     Right lower leg: No edema.     Left lower leg: No edema.     Comments: Noted crepitus bilateral knees.  Left knee does seem to be larger than the right knee but not noted with swelling and effusion but noted possibly with joint deformity and change.  Skin:    General: Skin is warm.  Neurological:     General: No focal deficit present.     Mental Status: She is alert and oriented to person, place, and time.   Psychiatric:        Mood and Affect: Affect is flat and angry.        Behavior: Behavior is agitated and withdrawn.        Cognition and Memory: Cognition is impaired. Memory is impaired.        Judgment: Judgment is impulsive.      Assessment   1. Late onset Alzheimer's disease with behavioral disturbance ([place])   2. Fall in home, initial encounter     Tests ordered No orders of the defined types were placed in this encounter.    Plan: Please see assessment and plan per problem list above.   No orders of the defined types were placed in this encounter.   Patient to follow-up in 12/09/2019   Perlie Mayo, NP

## 2019-07-03 NOTE — Assessment & Plan Note (Addendum)
As noted previously her MMSE has worsened. Sister that lives across the street is here today.  She lives with Rose [name].  He was not here.  I provided education on how to make home life a little bit better at this time. As avoiding conflict over sleep, food and the like.  Allowing her to eat and drink what she would like possibly offering supplementation with Ensure or boost if they do not feel like she is in nutrients.  Encouraging her to take medications and try not to feel frustrated she is not taking the medications. Given her current MMSE score of 13 unsure if Aricept is even needed at this time. I have asked him to make sure that the home is safe by avoiding court in the hallways and removing throw rugs and locking doors and windows at night.  In addition to trying to help her get exercise and activity and to help promote good sleep and eating.  As well as trying to get sunshine on a regular basis and giving her activities such as planting flowers or feeding birds.  Watching favorite shows a listening to favorite songs is also very comforting.  If they feel like she is unsafe at home this is something that needs to be discussed in detail with Dr. Moshe Cipro for possible need of placement and/or 24-hour care if available.

## 2019-07-05 ENCOUNTER — Ambulatory Visit: Payer: Medicare Other | Admitting: Orthopedic Surgery

## 2019-09-05 ENCOUNTER — Other Ambulatory Visit: Payer: Self-pay

## 2019-09-05 ENCOUNTER — Encounter: Payer: Self-pay | Admitting: Orthopaedic Surgery

## 2019-09-05 ENCOUNTER — Ambulatory Visit (INDEPENDENT_AMBULATORY_CARE_PROVIDER_SITE_OTHER): Payer: Medicare Other | Admitting: Orthopaedic Surgery

## 2019-09-05 ENCOUNTER — Ambulatory Visit: Payer: Medicare Other

## 2019-09-05 VITALS — BP 161/60 | HR 62 | Ht 65.0 in | Wt 138.0 lb

## 2019-09-05 DIAGNOSIS — G8929 Other chronic pain: Secondary | ICD-10-CM

## 2019-09-05 DIAGNOSIS — M25562 Pain in left knee: Secondary | ICD-10-CM

## 2019-09-05 NOTE — Progress Notes (Signed)
Patient [id number] Rose [name], female DOB:[birth year], 85 y.o. [id number]  Chief Complaint  Patient presents with  . Knee Pain    left     HPI  Rose [name] is a 85 y.o. female who has left knee pain that has gotten worse over the last couple of months. She has no trauma. She has swelling and popping and deep pain, more laterally.  She has no redness or giving way.  Tylenol does not help.     Body mass index is 22.96 kg/m.  ROS  Review of Systems  Constitutional: Positive for activity change.  Musculoskeletal: Positive for arthralgias, gait problem and joint swelling.  All other systems reviewed and are negative.   All other systems reviewed and are negative.  The following is a summary of the past history medically, past history surgically, known current medicines, social history and family history.  This information is gathered electronically by the computer from prior information and documentation.  I review this each visit and have found including this information at this point in the chart is beneficial and informative.    Past Medical History:  Diagnosis Date  . Chronic anxiety   . Dyslipidemia   . Hypothyroidism   . Palpitations    Frequent PACs by Holter monitor June 2011    Past Surgical History:  Procedure Laterality Date  . ABDOMINAL HYSTERECTOMY    . APPENDECTOMY    . CHOLECYSTECTOMY    . COLONOSCOPY  09/08/2011   Procedure: COLONOSCOPY;  Surgeon: Rogene Houston, MD;  Location: [facility];  Service: Endoscopy;  Laterality: N/A;  2:10  . PARTIAL HYSTERECTOMY      Family History  Problem Relation Age of Onset  . Colon cancer Mother   . Colon cancer Father   . Heart disease Father   . Cancer Brother 10       prostate   . Hypertension Sister   . Hypertension Sister     Social History Social History   Tobacco Use  . Smoking status: Never Smoker  . Smokeless tobacco: Never Used  Substance Use Topics  . Alcohol use: No  . Drug use: No     Allergies  Allergen Reactions  . Penicillins     Current Outpatient Medications  Medication Sig Dispense Refill  . alendronate (FOSAMAX) 70 MG tablet Take 1 tablet (70 mg total) by mouth every 7 (seven) days. Take with a full glass of water on an empty stomach. 4 tablet 11  . Calcium 500-125 MG-UNIT TABS Take by mouth 3 (three) times daily.      . diphenhydramine-acetaminophen (TYLENOL PM) 25-500 MG TABS tablet Take 1 tablet by mouth at bedtime as needed. 30 tablet 3  . donepezil (ARICEPT) 10 MG tablet Take 1 tablet (10 mg total) by mouth at bedtime. 90 tablet 3   No current facility-administered medications for this visit.     Physical Exam  Blood pressure (!) 161/60, pulse 62, height 5\' 5"  (1.651 m), weight 138 lb (62.6 kg).  Constitutional: overall normal hygiene, normal nutrition, well developed, normal grooming, normal body habitus. Assistive device:none  Musculoskeletal: gait and station Limp left, muscle tone and strength are normal, no tremors or atrophy is present.  .  Neurological: coordination overall normal.  Deep tendon reflex/nerve stretch intact.  Sensation normal.  Cranial nerves II-XII intact.   Skin:   Normal overall no scars, lesions, ulcers or rashes. No psoriasis.  Psychiatric: Alert and oriented x 3.  Recent memory intact, remote  memory unclear.  Normal mood and affect. Well groomed.  Good eye contact.  Cardiovascular: overall no swelling, no varicosities, no edema bilaterally, normal temperatures of the legs and arms, no clubbing, cyanosis and good capillary refill.  Lymphatic: palpation is normal.  Left knee has pain, some effusion, crepitus ROM 0 to 105, lateral knee pain, knock-knee early deformity, stable, NV intact.  No distal edema is present.  All other systems reviewed and are negative   The patient has been educated about the nature of the problem(s) and counseled on treatment options.  The patient appeared to understand what I have  discussed and is in agreement with it.  Encounter Diagnosis  Name Primary?  . Chronic pain of left knee Yes   X-rays were done of the left knee, reported separately.  PROCEDURE NOTE:  The patient requests injections of the left knee , verbal consent was obtained.  The left knee was prepped appropriately after time out was performed.   Sterile technique was observed and injection of 1 cc of Depo-Medrol 40 mg with several cc's of plain xylocaine. Anesthesia was provided by ethyl chloride and a 20-gauge needle was used to inject the knee area. The injection was tolerated well.  A band aid dressing was applied.  The patient was advised to apply ice later today and tomorrow to the injection sight as needed.   PLAN Call if any problems.  Precautions discussed.  Continue current medications.   Return to clinic 2 weeks   She is candidate for total knee.  Electronically Signed Sanjuana Kava, MD 4/22/202110:31 AM

## 2019-09-19 ENCOUNTER — Ambulatory Visit (INDEPENDENT_AMBULATORY_CARE_PROVIDER_SITE_OTHER): Payer: Medicare Other | Admitting: Orthopaedic Surgery

## 2019-09-19 ENCOUNTER — Encounter: Payer: Self-pay | Admitting: Orthopaedic Surgery

## 2019-09-19 ENCOUNTER — Other Ambulatory Visit: Payer: Self-pay

## 2019-09-19 VITALS — BP 150/71 | HR 62 | Ht 65.0 in | Wt 138.0 lb

## 2019-09-19 DIAGNOSIS — M25562 Pain in left knee: Secondary | ICD-10-CM | POA: Diagnosis not present

## 2019-09-19 DIAGNOSIS — G8929 Other chronic pain: Secondary | ICD-10-CM

## 2019-09-19 NOTE — Progress Notes (Signed)
PROCEDURE NOTE:  The patient requests injections of the left knee , verbal consent was obtained.  The left knee was prepped appropriately after time out was performed.   Sterile technique was observed and injection of 1 cc of Depo-Medrol 40 mg with several cc's of plain xylocaine. Anesthesia was provided by ethyl chloride and a 20-gauge needle was used to inject the knee area. The injection was tolerated well.  A band aid dressing was applied.  The patient was advised to apply ice later today and tomorrow to the injection sight as needed.  I will see as needed.  Electronically Signed Sanjuana Kava, MD 5/6/20218:43 AM

## 2019-10-07 DIAGNOSIS — B351 Tinea unguium: Secondary | ICD-10-CM | POA: Diagnosis not present

## 2019-10-07 DIAGNOSIS — M79674 Pain in right toe(s): Secondary | ICD-10-CM | POA: Diagnosis not present

## 2019-10-07 DIAGNOSIS — I739 Peripheral vascular disease, unspecified: Secondary | ICD-10-CM | POA: Diagnosis not present

## 2019-10-07 DIAGNOSIS — M79675 Pain in left toe(s): Secondary | ICD-10-CM | POA: Diagnosis not present

## 2019-10-15 IMAGING — CT CT HEAD W/O CM
3 series · 15 of 47 positions shown, 18 images · non-contrast
Comparison: 06/12/2012

CLINICAL DATA: Dementia, confusion, worsening symptoms, memory loss

EXAM:
CT HEAD WITHOUT CONTRAST
TECHNIQUE: Contiguous axial images were obtained from the base of the skull
through the vertex without intravenous contrast. Sagittal and
coronal MPR images reconstructed from axial data set.

[Series 2: head wo · axial · 0.45mm/px · z∈[+33,+158]mm · 9 of 31 slices shown, 12 images]
[im 3/31  brain]
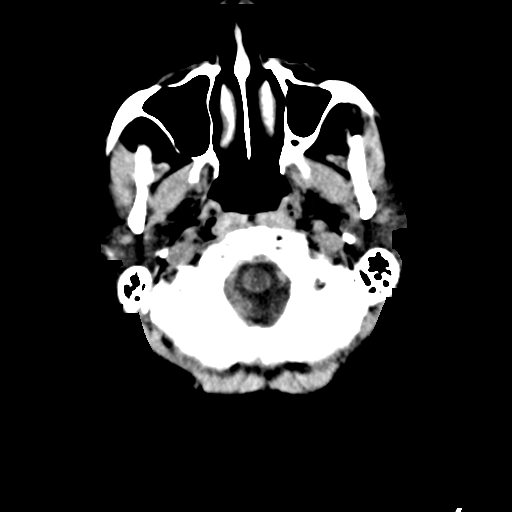
[im 3/31  bone]
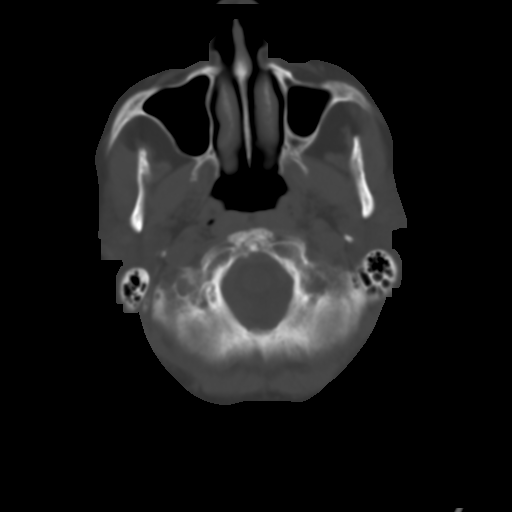
[im 6/31  brain]
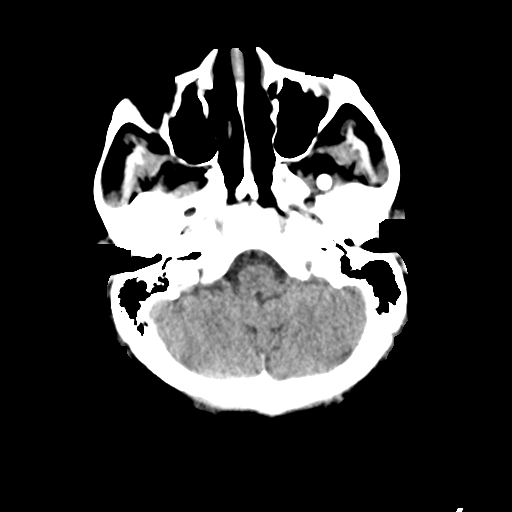
[im 9/31  brain]
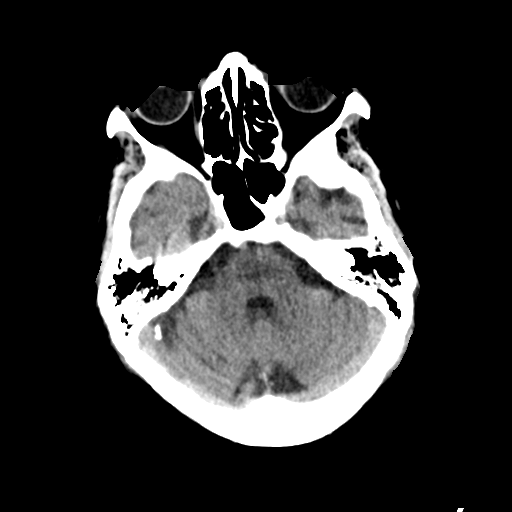
[im 12/31  brain]
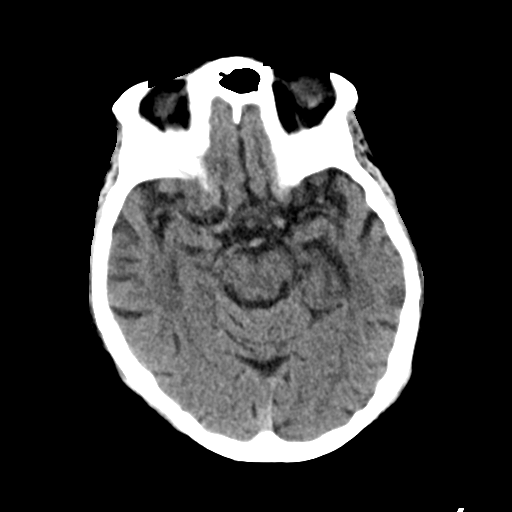
[im 16/31  brain]
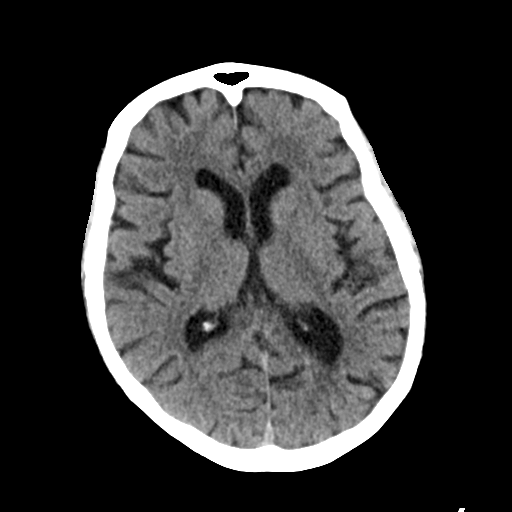
[im 16/31  bone]
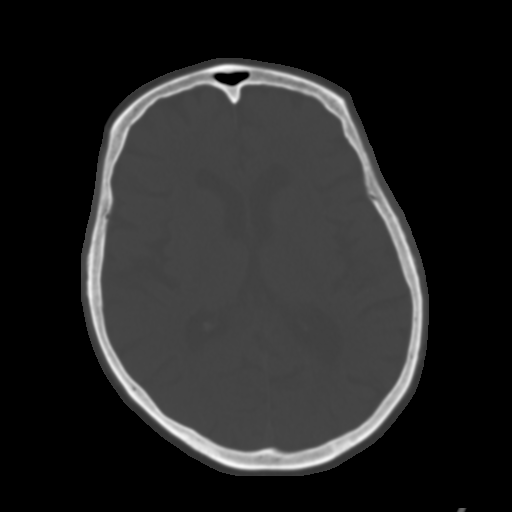
[im 19/31  brain]
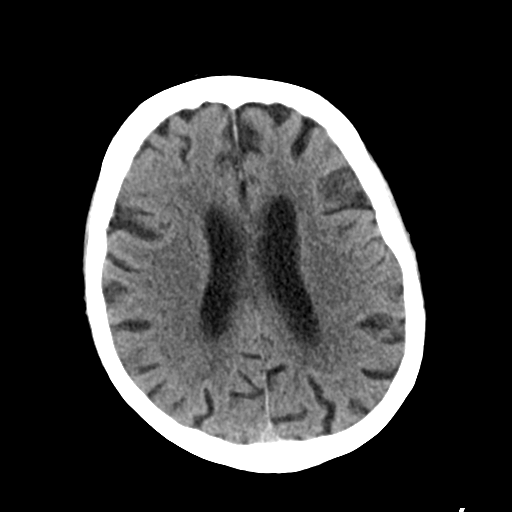
[im 22/31  brain]
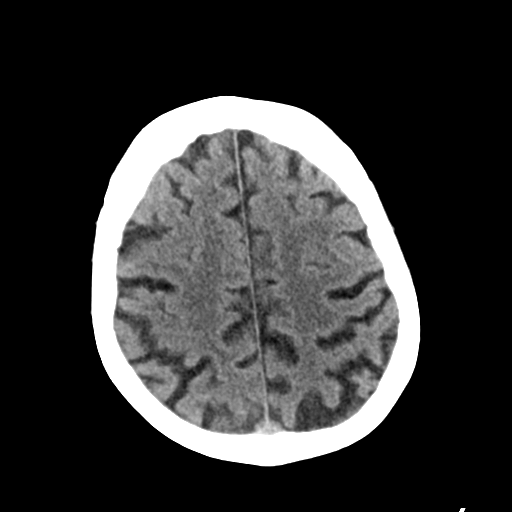
[im 25/31  brain]
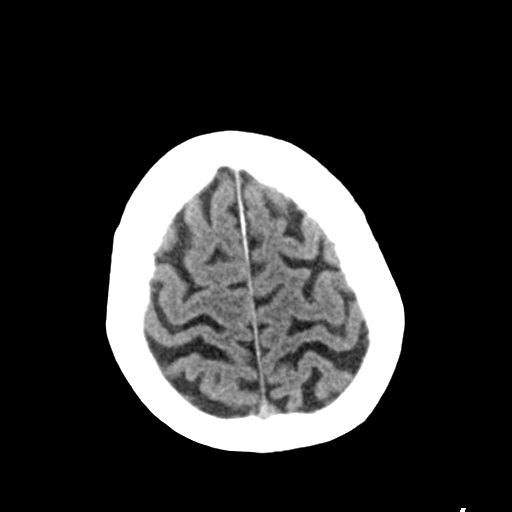
[im 28/31  brain]
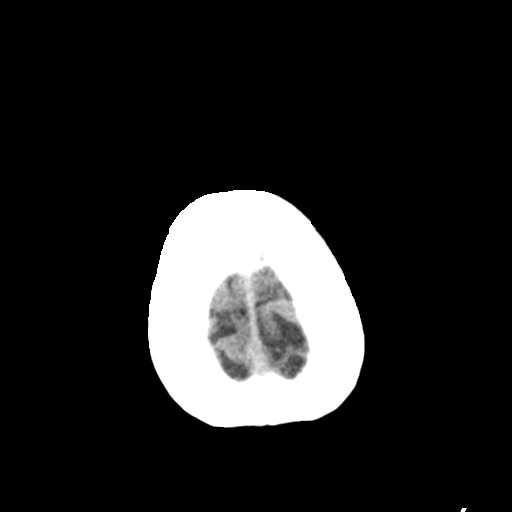
[im 28/31  bone]
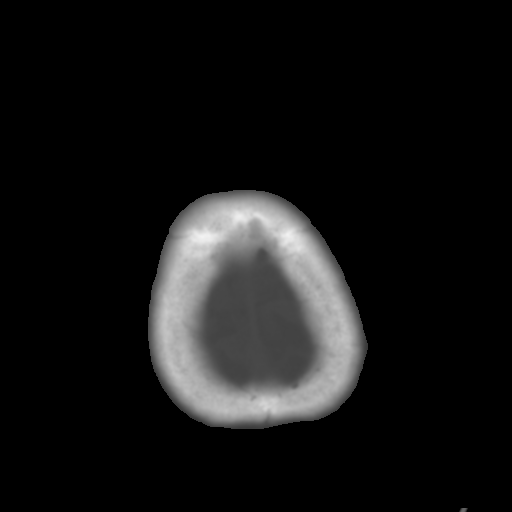

[Series 4: coronal soft tissue · coronal · 0.30mm/px · 3 of 67 slices shown]
[im 23/67  brain]
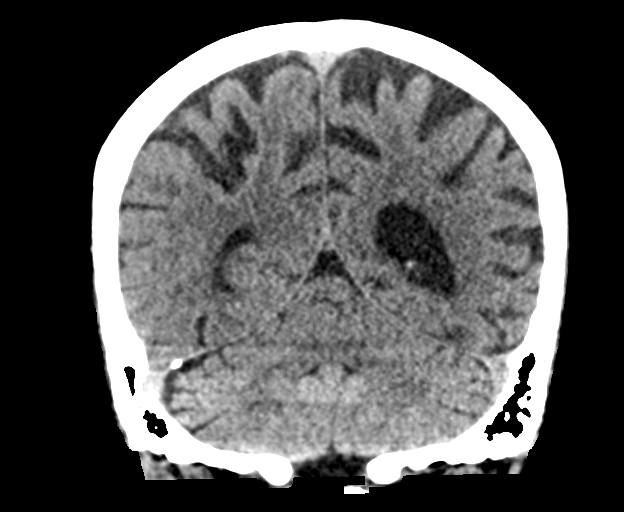
[im 30/67  brain]
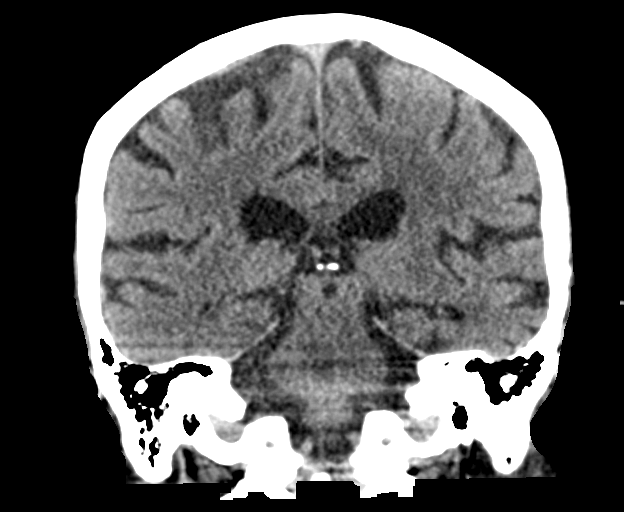
[im 37/67  brain]
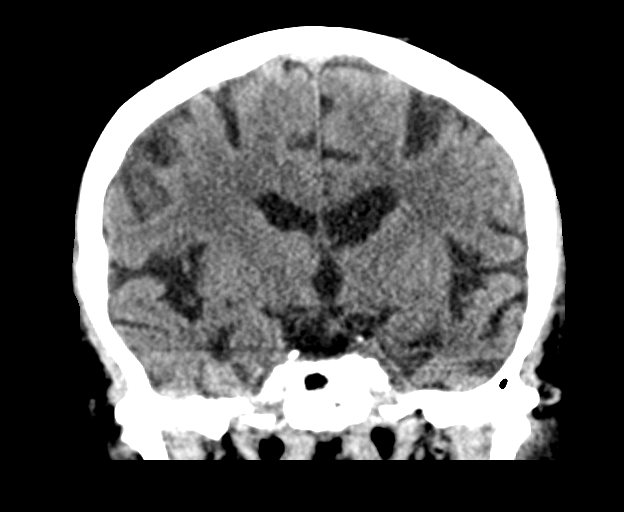

[Series 5: sagittal soft tissue · sagittal · 0.32mm/px · 3 of 59 slices shown]
[im 20/59  brain]
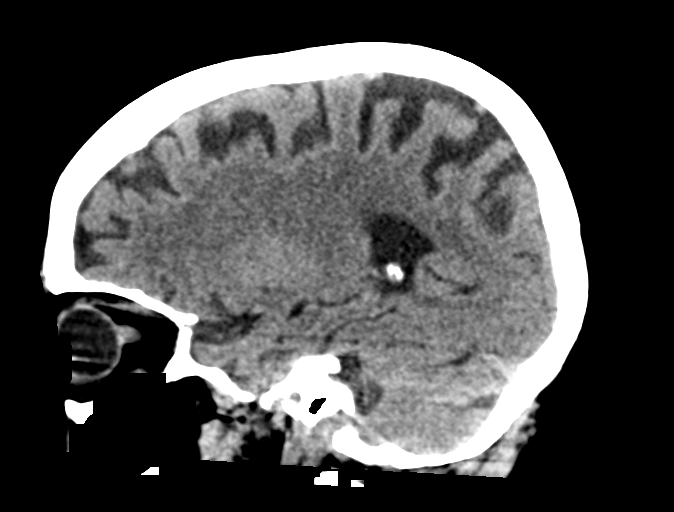
[im 30/59  brain]
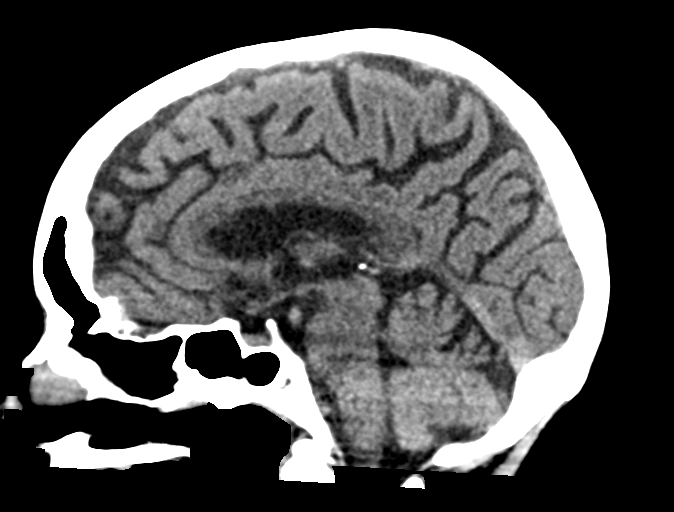
[im 39/59  brain]
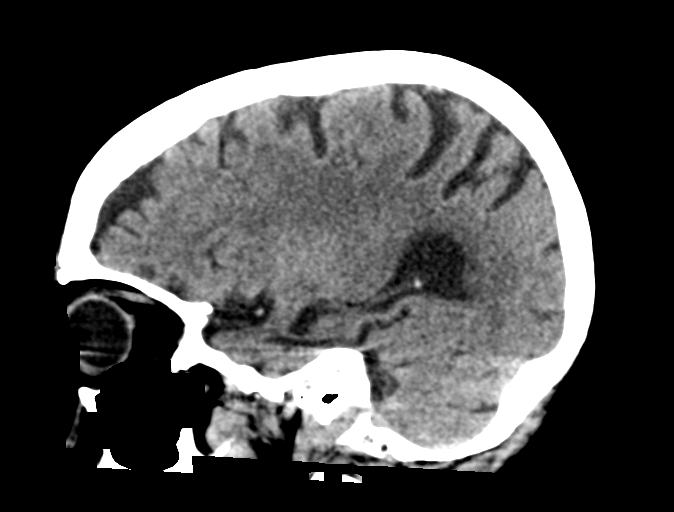

[15 of 47 positions shown; findings below may reference images not displayed]

FINDINGS: Brain: Generalized atrophy. Normal ventricular morphology. No
midline shift or mass effect. Minimal small vessel chronic ischemic
changes of deep cerebral white matter. No intracranial hemorrhage,
mass lesion, or evidence of acute infarction. No extra-axial fluid
collections.

Vascular: No hyperdense vessels.

Skull: Intact

Sinuses/Orbits: Clear

Other: N/A
IMPRESSION: Atrophy with minimal small vessel chronic ischemic changes of deep
cerebral white matter.

No acute intracranial abnormalities.

## 2019-12-09 ENCOUNTER — Ambulatory Visit (INDEPENDENT_AMBULATORY_CARE_PROVIDER_SITE_OTHER): Payer: Medicare Other | Admitting: Family Medicine

## 2019-12-09 ENCOUNTER — Encounter: Payer: Self-pay | Admitting: Family Medicine

## 2019-12-09 ENCOUNTER — Other Ambulatory Visit: Payer: Self-pay

## 2019-12-09 VITALS — BP 129/64 | HR 64 | Temp 97.4°F | Resp 16 | Ht 65.0 in | Wt 141.0 lb

## 2019-12-09 DIAGNOSIS — I491 Atrial premature depolarization: Secondary | ICD-10-CM

## 2019-12-09 DIAGNOSIS — F02818 Dementia in other diseases classified elsewhere, unspecified severity, with other behavioral disturbance: Secondary | ICD-10-CM

## 2019-12-09 DIAGNOSIS — I10 Essential (primary) hypertension: Secondary | ICD-10-CM | POA: Diagnosis not present

## 2019-12-09 DIAGNOSIS — M810 Age-related osteoporosis without current pathological fracture: Secondary | ICD-10-CM

## 2019-12-09 DIAGNOSIS — G4701 Insomnia due to medical condition: Secondary | ICD-10-CM | POA: Diagnosis not present

## 2019-12-09 DIAGNOSIS — E559 Vitamin D deficiency, unspecified: Secondary | ICD-10-CM

## 2019-12-09 DIAGNOSIS — R7989 Other specified abnormal findings of blood chemistry: Secondary | ICD-10-CM | POA: Diagnosis not present

## 2019-12-09 DIAGNOSIS — G301 Alzheimer's disease with late onset: Secondary | ICD-10-CM | POA: Diagnosis not present

## 2019-12-09 DIAGNOSIS — F0281 Dementia in other diseases classified elsewhere with behavioral disturbance: Secondary | ICD-10-CM

## 2019-12-09 NOTE — Patient Instructions (Addendum)
F/U in 6 months, with MD, call if you need me sooner  Fasting CBC, lipid , cmp and EGFR, TSH vitamin D in January 5 days before visit   Careful not to fall.  Happy 46!  Thanks for choosing [facility], we consider it a privelige to serve you.

## 2019-12-10 NOTE — Assessment & Plan Note (Signed)
incrreasing difficulty wih falling and staying asleep, with night wandering, avised tylenol pM one to 2 at bedtime

## 2019-12-10 NOTE — Progress Notes (Signed)
   Rose [name]     MRN: [id number]      DOB: 04-04-1935   HPI Rose [name] is here for follow up and re-evaluation of chronic medical conditions, medication management and review of any available recent lab and radiology data.  Preventive health is updated, specifically  Cancer screening and Immunization.  No interest in mammogram currently [place] states she feels ell and has no medical concerns or complaints. She is unable to recall her birthday or verify her age at the visit. Her dementia is increasingly worsening, sister , Rose [name] , who accompanies her reports increased agitation and irritability, and continiues to resist taking medication No falls. Appetite is good.    ROS History from sister as pt is incapable due to severe dementia Denies recent fever or chills. Denies sinus pressure, nasal congestion, Denies chest congestion, productive cough or wheezing. Denies chest pains, palpitations and leg swelling Denies abdominal pain, nausea, vomiting,diarrhea or constipation.   Denies dysuria, frequency, hesitancy or incontinence. Denies joint pain, swelling and limitation in mobility.Has left knee arthritis , but  No recent flare Denies headaches, seizures, numbness, or tingling. . Denies skin break down or rash.   PE  BP (!) 129/64 (BP Location: Right Arm, Patient Position: Sitting, Cuff Size: Normal)   Pulse 64   Temp (!) 97.4 F (36.3 C) (Temporal)   Resp 16   Ht 5\' 5"  (1.651 m)   Wt 141 lb (64 kg)   SpO2 96%   BMI 23.46 kg/m   Patient alert  and in no cardiopulmonary distress.  HEENT: No facial asymmetry, EOMI,     Neck supple .  Chest: Clear to auscultation bilaterally.  CVS: S1, S2 no murmurs, no S3.Regular rate.  ABD: Soft non tender.   Ext: No edema  MS: Adequate ROM spine, shoulders, hips and right knee, decreased in left knee which has deformity and crepitus.  Skin: Intact, no ulcerations or rash noted.  Psych: Good eye contact, severe . Memory  loss, mildly anxious toward end of visit and irritable not  depressed appearing.  CNS: CN 2-12 intact, power,  normal throughout.no focal deficits noted.   Assessment & Plan  Dementia with behavioral disturbance (HCC) Marked deterioration, pt unable to recall her birthday. Increasing difficulty caring for her at home reported due to mood swings and irritability. Sister reports that she has decided on placement in the not too distant future medication compliance is a major challenge  Osteoporosis On bisphosphonate and calcium, medication compliance a challenge  Insomnia incrreasing difficulty wih falling and staying asleep, with night wandering, avised tylenol pM one to 2 at bedtime

## 2019-12-10 NOTE — Assessment & Plan Note (Addendum)
Marked deterioration, pt unable to recall her birthday. Increasing difficulty caring for her at home reported due to mood swings and irritability. Sister reports that she has decided on placement in the not too distant future medication compliance is a major challenge

## 2019-12-10 NOTE — Assessment & Plan Note (Signed)
On bisphosphonate and calcium, medication compliance a challenge

## 2019-12-16 ENCOUNTER — Ambulatory Visit: Payer: Medicare Other | Admitting: Orthopedic Surgery

## 2019-12-16 ENCOUNTER — Encounter: Payer: Self-pay | Admitting: Orthopedic Surgery

## 2020-01-13 DIAGNOSIS — M79675 Pain in left toe(s): Secondary | ICD-10-CM | POA: Diagnosis not present

## 2020-01-13 DIAGNOSIS — L851 Acquired keratosis [keratoderma] palmaris et plantaris: Secondary | ICD-10-CM | POA: Diagnosis not present

## 2020-01-13 DIAGNOSIS — M79674 Pain in right toe(s): Secondary | ICD-10-CM | POA: Diagnosis not present

## 2020-01-13 DIAGNOSIS — B351 Tinea unguium: Secondary | ICD-10-CM | POA: Diagnosis not present

## 2020-01-15 ENCOUNTER — Ambulatory Visit (INDEPENDENT_AMBULATORY_CARE_PROVIDER_SITE_OTHER): Payer: Medicare Other | Admitting: Orthopedic Surgery

## 2020-01-15 ENCOUNTER — Encounter: Payer: Self-pay | Admitting: Orthopedic Surgery

## 2020-01-15 ENCOUNTER — Other Ambulatory Visit: Payer: Self-pay

## 2020-01-15 VITALS — BP 142/78 | HR 70 | Ht 65.0 in | Wt 141.0 lb

## 2020-01-15 DIAGNOSIS — G8929 Other chronic pain: Secondary | ICD-10-CM

## 2020-01-15 DIAGNOSIS — M25562 Pain in left knee: Secondary | ICD-10-CM

## 2020-01-15 NOTE — Patient Instructions (Signed)

## 2020-01-15 NOTE — Progress Notes (Signed)
Chief Complaint  Patient presents with   Knee Pain    left    inj req   Procedure note left knee injection   verbal consent was obtained to inject left knee joint  Timeout was completed to confirm the site of injection  The medications used were 40 mg of Depo-Medrol and 1% lidocaine 3 cc  Anesthesia was provided by ethyl chloride and the skin was prepped with alcohol.  After cleaning the skin with alcohol a 20-gauge needle was used to inject the left knee joint. There were no complications. A sterile bandage was applied.  Encounter Diagnosis  Name Primary?   Chronic pain of left knee Yes    4 mos fu

## 2020-01-31 ENCOUNTER — Ambulatory Visit (INDEPENDENT_AMBULATORY_CARE_PROVIDER_SITE_OTHER): Payer: Medicare Other

## 2020-01-31 ENCOUNTER — Other Ambulatory Visit: Payer: Self-pay

## 2020-01-31 DIAGNOSIS — Z23 Encounter for immunization: Secondary | ICD-10-CM | POA: Diagnosis not present

## 2020-03-09 DIAGNOSIS — Z23 Encounter for immunization: Secondary | ICD-10-CM | POA: Diagnosis not present

## 2020-03-23 DIAGNOSIS — M79675 Pain in left toe(s): Secondary | ICD-10-CM | POA: Diagnosis not present

## 2020-03-23 DIAGNOSIS — M79674 Pain in right toe(s): Secondary | ICD-10-CM | POA: Diagnosis not present

## 2020-03-23 DIAGNOSIS — L851 Acquired keratosis [keratoderma] palmaris et plantaris: Secondary | ICD-10-CM | POA: Diagnosis not present

## 2020-03-23 DIAGNOSIS — B351 Tinea unguium: Secondary | ICD-10-CM | POA: Diagnosis not present

## 2020-04-15 ENCOUNTER — Other Ambulatory Visit: Payer: Self-pay

## 2020-04-15 ENCOUNTER — Encounter: Payer: Self-pay | Admitting: Orthopedic Surgery

## 2020-04-15 ENCOUNTER — Ambulatory Visit (INDEPENDENT_AMBULATORY_CARE_PROVIDER_SITE_OTHER): Payer: Medicare Other | Admitting: Orthopedic Surgery

## 2020-04-15 VITALS — BP 158/83 | HR 72 | Ht 65.0 in | Wt 140.0 lb

## 2020-04-15 DIAGNOSIS — G8929 Other chronic pain: Secondary | ICD-10-CM

## 2020-04-15 DIAGNOSIS — M25562 Pain in left knee: Secondary | ICD-10-CM

## 2020-04-15 NOTE — Progress Notes (Signed)
Chief Complaint  Patient presents with  . Knee Pain    left knee injection    85 nonoperative osteoarthritis gets an injection every 6 months request with knee injection  Procedure note left knee injection   verbal consent was obtained to inject left knee joint  Timeout was completed to confirm the site of injection  The medications used were 40 mg of Depo-Medrol and 1% lidocaine 3 cc  Anesthesia was provided by ethyl chloride and the skin was prepped with alcohol.  After cleaning the skin with alcohol a 20-gauge needle was used to inject the left knee joint. There were no complications. A sterile bandage was applied.  Encounter Diagnosis  Name Primary?  . Chronic pain of left knee Yes   Fu 6 mos repeat injection

## 2020-06-04 DIAGNOSIS — I10 Essential (primary) hypertension: Secondary | ICD-10-CM | POA: Diagnosis not present

## 2020-06-04 DIAGNOSIS — E559 Vitamin D deficiency, unspecified: Secondary | ICD-10-CM | POA: Diagnosis not present

## 2020-06-04 DIAGNOSIS — R7989 Other specified abnormal findings of blood chemistry: Secondary | ICD-10-CM | POA: Diagnosis not present

## 2020-06-05 LAB — CMP14+EGFR
ALT: 27 IU/L (ref 0–32)
AST: 30 IU/L (ref 0–40)
Albumin/Globulin Ratio: 2 (ref 1.2–2.2)
Albumin: 4.5 g/dL (ref 3.6–4.6)
Alkaline Phosphatase: 120 IU/L (ref 44–121)
BUN/Creatinine Ratio: 16 (ref 12–28)
BUN: 12 mg/dL (ref 8–27)
Bilirubin Total: 0.5 mg/dL (ref 0.0–1.2)
CO2: 26 mmol/L (ref 20–29)
Calcium: 10 mg/dL (ref 8.7–10.3)
Chloride: 100 mmol/L (ref 96–106)
Creatinine, Ser: 0.74 mg/dL (ref 0.57–1.00)
GFR calc Af Amer: 87 mL/min/{1.73_m2} (ref >59)
GFR calc non Af Amer: 75 mL/min/{1.73_m2} (ref >59)
Globulin, Total: 2.2 g/dL (ref 1.5–4.5)
Glucose: 93 mg/dL (ref 65–99)
Potassium: 4.6 mmol/L (ref 3.5–5.2)
Sodium: 139 mmol/L (ref 134–144)
Total Protein: 6.7 g/dL (ref 6.0–8.5)

## 2020-06-05 LAB — CBC
Hematocrit: 43 % (ref 34.0–46.6)
Hemoglobin: 14.3 g/dL (ref 11.1–15.9)
MCH: 29.7 pg (ref 26.6–33.0)
MCHC: 33.3 g/dL (ref 31.5–35.7)
MCV: 89 fL (ref 79–97)
Platelets: 226 10*3/uL (ref 150–450)
RBC: 4.82 x10E6/uL (ref 3.77–5.28)
RDW: 12.4 % (ref 11.7–15.4)
WBC: 3.9 10*3/uL (ref 3.4–10.8)

## 2020-06-05 LAB — LIPID PANEL
Chol/HDL Ratio: 2.4 ratio (ref 0.0–4.4)
Cholesterol, Total: 229 mg/dL — ABNORMAL HIGH (ref 100–199)
HDL: 96 mg/dL (ref >39)
LDL Chol Calc (NIH): 115 mg/dL — ABNORMAL HIGH (ref 0–99)
Triglycerides: 108 mg/dL (ref 0–149)
VLDL Cholesterol Cal: 18 mg/dL (ref 5–40)

## 2020-06-05 LAB — TSH: TSH: 8.18 u[IU]/mL — ABNORMAL HIGH (ref 0.450–4.500)

## 2020-06-05 LAB — VITAMIN D 25 HYDROXY (VIT D DEFICIENCY, FRACTURES): Vit D, 25-Hydroxy: 32.1 ng/mL (ref 30.0–100.0)

## 2020-06-10 ENCOUNTER — Other Ambulatory Visit: Payer: Self-pay

## 2020-06-10 ENCOUNTER — Encounter: Payer: Self-pay | Admitting: Family Medicine

## 2020-06-10 ENCOUNTER — Telehealth (INDEPENDENT_AMBULATORY_CARE_PROVIDER_SITE_OTHER): Payer: Medicare Other | Admitting: Family Medicine

## 2020-06-10 VITALS — BP 131/63 | HR 71 | Temp 98.2°F | Ht 65.0 in | Wt 138.0 lb

## 2020-06-10 DIAGNOSIS — M810 Age-related osteoporosis without current pathological fracture: Secondary | ICD-10-CM | POA: Diagnosis not present

## 2020-06-10 DIAGNOSIS — G301 Alzheimer's disease with late onset: Secondary | ICD-10-CM

## 2020-06-10 DIAGNOSIS — E039 Hypothyroidism, unspecified: Secondary | ICD-10-CM | POA: Diagnosis not present

## 2020-06-10 DIAGNOSIS — F0281 Dementia in other diseases classified elsewhere with behavioral disturbance: Secondary | ICD-10-CM | POA: Diagnosis not present

## 2020-06-10 DIAGNOSIS — F02818 Dementia in other diseases classified elsewhere, unspecified severity, with other behavioral disturbance: Secondary | ICD-10-CM

## 2020-06-10 DIAGNOSIS — R7989 Other specified abnormal findings of blood chemistry: Secondary | ICD-10-CM | POA: Diagnosis not present

## 2020-06-10 DIAGNOSIS — F5104 Psychophysiologic insomnia: Secondary | ICD-10-CM

## 2020-06-10 MED ORDER — TEMAZEPAM 7.5 MG PO CAPS
7.5000 mg | ORAL_CAPSULE | Freq: Every evening | ORAL | 3 refills | Status: DC | PRN
Start: 2020-06-10 — End: 2020-10-22

## 2020-06-10 NOTE — Patient Instructions (Addendum)
F/U in 6 months, call if you need me sooner  Nurse please add free T3 and free T4 to recent lab, dx is hypothyroid   Labs are excellent  New additional medication to help with sleep  Be caareful not to fall  Please provide or let us know when you need an FL2 form for placementt, I recommend visiting the facility mentioned here in [place]  Thanks for choosing [facility], we consider it a privelige to serve you.

## 2020-06-10 NOTE — Progress Notes (Signed)
Virtual Visit via Telephone Note  I connected with Rose [name] on 06/10/20 at 10:40 AM EST by telephone and verified that I am speaking with the correct person using two identifiers.  Location: Patient: home Provider: work   I discussed the limitations, risks, security and privacy concerns of performing an evaluation and management service by telephone and the availability of in person appointments. I also discussed with the patient that there may be a patient responsible charge related to this service. The patient expressed understanding and agreed to proceed.   History of Present Illness: Review recent lab data, routine health maintainace needs which she will allow are addressed Patient reports she is fine with no concerns History from sister is concerning for poor sleep and increased wandering, states she is now intent on placing her ina home and will actively visit the facility in [place] which I have recommended and contact us once she has made the final decision re necessary placement Denies recent fever or chills. Denies sinus pressure, nasal congestion, ear pain or sore throat. Denies chest congestion, productive cough or wheezing. Denies chest pains, palpitations and leg swelling Denies abdominal pain, nausea, vomiting,diarrhea or constipation.   Denies dysuria, frequency, hesitancy or incontinence. Denies uncontrolled  joint pain, swelling and limitation in mobility. Denies headaches, seizures, numbness, or tingling. Denies depression Denies skin break down or rash.       Observations/Objective: BP 131/63   Pulse 71   Temp 98.2 F (36.8 C)   Ht 5\' 5"  (1.651 m)   Wt 138 lb (62.6 kg)   BMI 22.96 kg/m  Good communication with no confusion and intact memory. Alert and oriented x 3 No signs of respiratory distress during speech    Assessment and Plan:  Dementia with behavioral disturbance (HCC) Worsening symptoms, placement indicated, report of wandering,  sister will contact us once she determines where she would like pt placed  Insomnia Sleep hygiene reviewed . Prescription sent for  medication needed.   Osteoporosis Continue calcium and fosamax, weight bearing exercise/ activity encouraged   Follow Up Instructions:    I discussed the assessment and treatment plan with the patient. The patient was provided an opportunity to ask questions and all were answered. The patient agreed with the plan and demonstrated an understanding of the instructions.   The patient was advised to call back or seek an in-person evaluation if the symptoms worsen or if the condition fails to improve as anticipated.  I provided 22 minutes of non-face-to-face time during this encounter.   [name], MD

## 2020-06-12 ENCOUNTER — Other Ambulatory Visit: Payer: Self-pay

## 2020-06-12 ENCOUNTER — Encounter: Payer: Self-pay | Admitting: Family Medicine

## 2020-06-12 ENCOUNTER — Ambulatory Visit (INDEPENDENT_AMBULATORY_CARE_PROVIDER_SITE_OTHER): Payer: Medicare Other

## 2020-06-12 VITALS — Ht 65.0 in | Wt 141.0 lb

## 2020-06-12 DIAGNOSIS — Z0000 Encounter for general adult medical examination without abnormal findings: Secondary | ICD-10-CM | POA: Diagnosis not present

## 2020-06-12 NOTE — Assessment & Plan Note (Signed)
Worsening symptoms, placement indicated, report of wandering, sister will contact us once she determines where she would like pt placed

## 2020-06-12 NOTE — Patient Instructions (Addendum)
Rose [name] , Thank you for taking time to come for your Medicare Wellness Visit. I appreciate your ongoing commitment to your health goals. Please review the following plan we discussed and let me know if I can assist you in the future.   Screening recommendations/referrals: Colonoscopy: Complete Mammogram: Complete Bone Density: Complete Recommended yearly ophthalmology/optometry visit for glaucoma screening and checkup Recommended yearly dental visit for hygiene and checkup  Vaccinations: Influenza vaccine: Fall 2022 Pneumococcal vaccine: Complete Tdap vaccine: 06/10/22 Shingles vaccine: Declined   Advanced directives: POA and Living Will  Conditions/risks identified: None  Next appointment: 12/08/20 @ 11 am   Preventive Care 65 Years and Older, Female Preventive care refers to lifestyle choices and visits with your health care provider that can promote health and wellness. What does preventive care include?  A yearly physical exam. This is also called an annual well check.  Dental exams once or twice a year.  Routine eye exams. Ask your health care provider how often you should have your eyes checked.  Personal lifestyle choices, including:  Daily care of your teeth and gums.  Regular physical activity.  Eating a healthy diet.  Avoiding tobacco and drug use.  Limiting alcohol use.  Practicing safe sex.  Taking low-dose aspirin every day.  Taking vitamin and mineral supplements as recommended by your health care provider. What happens during an annual well check? The services and screenings done by your health care provider during your annual well check will depend on your age, overall health, lifestyle risk factors, and family history of disease. Counseling  Your health care provider may ask you questions about your:  Alcohol use.  Tobacco use.  Drug use.  Emotional well-being.  Home and relationship well-being.  Sexual activity.  Eating  habits.  History of falls.  Memory and ability to understand (cognition).  Work and work Statistician.  Reproductive health. Screening  You may have the following tests or measurements:  Height, weight, and BMI.  Blood pressure.  Lipid and cholesterol levels. These may be checked every 5 years, or more frequently if you are over 23 years old.  Skin check.  Lung cancer screening. You may have this screening every year starting at age 77 if you have a 30-pack-year history of smoking and currently smoke or have quit within the past 15 years.  Fecal occult blood test (FOBT) of the stool. You may have this test every year starting at age 84.  Flexible sigmoidoscopy or colonoscopy. You may have a sigmoidoscopy every 5 years or a colonoscopy every 10 years starting at age 68.  Hepatitis C blood test.  Hepatitis B blood test.  Sexually transmitted disease (STD) testing.  Diabetes screening. This is done by checking your blood sugar (glucose) after you have not eaten for a while (fasting). You may have this done every 1-3 years.  Bone density scan. This is done to screen for osteoporosis. You may have this done starting at age 105.  Mammogram. This may be done every 1-2 years. Talk to your health care provider about how often you should have regular mammograms. Talk with your health care provider about your test results, treatment options, and if necessary, the need for more tests. Vaccines  Your health care provider may recommend certain vaccines, such as:  Influenza vaccine. This is recommended every year.  Tetanus, diphtheria, and acellular pertussis (Tdap, Td) vaccine. You may need a Td booster every 10 years.  Zoster vaccine. You may need this after age 31.  Pneumococcal 13-valent conjugate (PCV13) vaccine. One dose is recommended after age 21.  Pneumococcal polysaccharide (PPSV23) vaccine. One dose is recommended after age 71. Talk to your health care provider about which  screenings and vaccines you need and how often you need them. This information is not intended to replace advice given to you by your health care provider. Make sure you discuss any questions you have with your health care provider. Document Released: 05/29/2015 Document Revised: 01/20/2016 Document Reviewed: 03/03/2015 Elsevier Interactive Patient Education  2017 [employer] Prevention in the Home Falls can cause injuries. They can happen to people of all ages. There are many things you can do to make your home safe and to help prevent falls. What can I do on the outside of my home?  Regularly fix the edges of walkways and driveways and fix any cracks.  Remove anything that might make you trip as you walk through a door, such as a raised step or threshold.  Trim any bushes or trees on the path to your home.  Use bright outdoor lighting.  Clear any walking paths of anything that might make someone trip, such as rocks or tools.  Regularly check to see if handrails are loose or broken. Make sure that both sides of any steps have handrails.  Any raised decks and porches should have guardrails on the edges.  Have any leaves, snow, or ice cleared regularly.  Use sand or salt on walking paths during winter.  Clean up any spills in your garage right away. This includes oil or grease spills. What can I do in the bathroom?  Use night lights.  Install grab bars by the toilet and in the tub and shower. Do not use towel bars as grab bars.  Use non-skid mats or decals in the tub or shower.  If you need to sit down in the shower, use a plastic, non-slip stool.  Keep the floor dry. Clean up any water that spills on the floor as soon as it happens.  Remove soap buildup in the tub or shower regularly.  Attach bath mats securely with double-sided non-slip rug tape.  Do not have throw rugs and other things on the floor that can make you trip. What can I do in the bedroom?  Use  night lights.  Make sure that you have a light by your bed that is easy to reach.  Do not use any sheets or blankets that are too big for your bed. They should not hang down onto the floor.  Have a firm chair that has side arms. You can use this for support while you get dressed.  Do not have throw rugs and other things on the floor that can make you trip. What can I do in the kitchen?  Clean up any spills right away.  Avoid walking on wet floors.  Keep items that you use a lot in easy-to-reach places.  If you need to reach something above you, use a strong step stool that has a grab bar.  Keep electrical cords out of the way.  Do not use floor polish or wax that makes floors slippery. If you must use wax, use non-skid floor wax.  Do not have throw rugs and other things on the floor that can make you trip. What can I do with my stairs?  Do not leave any items on the stairs.  Make sure that there are handrails on both sides of the stairs and use them.  Fix handrails that are broken or loose. Make sure that handrails are as long as the stairways.  Check any carpeting to make sure that it is firmly attached to the stairs. Fix any carpet that is loose or worn.  Avoid having throw rugs at the top or bottom of the stairs. If you do have throw rugs, attach them to the floor with carpet tape.  Make sure that you have a light switch at the top of the stairs and the bottom of the stairs. If you do not have them, ask someone to add them for you. What else can I do to help prevent falls?  Wear shoes that:  Do not have high heels.  Have rubber bottoms.  Are comfortable and fit you well.  Are closed at the toe. Do not wear sandals.  If you use a stepladder:  Make sure that it is fully opened. Do not climb a closed stepladder.  Make sure that both sides of the stepladder are locked into place.  Ask someone to hold it for you, if possible.  Clearly mark and make sure that you  can see:  Any grab bars or handrails.  First and last steps.  Where the edge of each step is.  Use tools that help you move around (mobility aids) if they are needed. These include:  Canes.  Walkers.  Scooters.  Crutches.  Turn on the lights when you go into a dark area. Replace any light bulbs as soon as they burn out.  Set up your furniture so you have a clear path. Avoid moving your furniture around.  If any of your floors are uneven, fix them.  If there are any pets around you, be aware of where they are.  Review your medicines with your doctor. Some medicines can make you feel dizzy. This can increase your chance of falling. Ask your doctor what other things that you can do to help prevent falls. This information is not intended to replace advice given to you by your health care provider. Make sure you discuss any questions you have with your health care provider. Document Released: 02/26/2009 Document Revised: 10/08/2015 Document Reviewed: 06/06/2014 Elsevier Interactive Patient Education  2017 [employer].

## 2020-06-12 NOTE — Assessment & Plan Note (Signed)
Sleep hygiene reviewed  Prescription sent for  medication needed.  

## 2020-06-12 NOTE — Assessment & Plan Note (Signed)
Continue calcium and fosamax, weight bearing exercise/ activity encouraged

## 2020-06-12 NOTE — Progress Notes (Signed)
Subjective:   Rose [name] is a 86 y.o. female who presents for Medicare Annual (Subsequent) preventive examination.  Review of Systems       Objective:    There were no vitals filed for this visit. There is no height or weight on file to calculate BMI.  Advanced Directives 05/28/2018  Does Patient Have a Medical Advance Directive? Yes  Type of Advance Directive Living will  Does patient want to make changes to medical advance directive? No - Patient declined    Current Medications (verified) Outpatient Encounter Medications as of 06/12/2020  Medication Sig  . alendronate (FOSAMAX) 70 MG tablet Take 1 tablet (70 mg total) by mouth every 7 (seven) days. Take with a full glass of water on an empty stomach.  . Calcium 500-125 MG-UNIT TABS Take by mouth 3 (three) times daily.  . diphenhydramine-acetaminophen (TYLENOL PM) 25-500 MG TABS tablet Take 1 tablet by mouth at bedtime as needed.  . donepezil (ARICEPT) 10 MG tablet Take 1 tablet (10 mg total) by mouth at bedtime.  . temazepam (RESTORIL) 7.5 MG capsule Take 1 capsule (7.5 mg total) by mouth at bedtime as needed for sleep.   No facility-administered encounter medications on file as of 06/12/2020.    Allergies (verified) Penicillins   History: Past Medical History:  Diagnosis Date  . Chronic anxiety   . Dyslipidemia   . Hypothyroidism   . Palpitations    Frequent PACs by Holter monitor June 2011   Past Surgical History:  Procedure Laterality Date  . ABDOMINAL HYSTERECTOMY    . APPENDECTOMY    . CHOLECYSTECTOMY    . COLONOSCOPY  09/08/2011   Procedure: COLONOSCOPY;  Surgeon: Malissa HippoNajeeb U Rehman, MD;  Location: [facility];  Service: Endoscopy;  Laterality: N/A;  2:10  . PARTIAL HYSTERECTOMY     Family History  Problem Relation Age of Onset  . Colon cancer Mother   . Colon cancer Father   . Heart disease Father   . Cancer Brother 265       prostate   . Hypertension Sister   . Hypertension Sister    Social  History   Socioeconomic History  . Marital status: Widowed    Spouse name: Not on file  . Number of children: 0  . Years of education: 12+  . Highest education level: Associate degree: occupational, Scientist, product/process developmenttechnical, or vocational program  Occupational History  . Occupation: retired   Tobacco Use  . Smoking status: Never Smoker  . Smokeless tobacco: Never Used  Substance and Sexual Activity  . Alcohol use: No  . Drug use: No  . Sexual activity: Not Currently  Other Topics Concern  . Not on file  Social History Narrative   [place] living [place] her    Social Determinants of Health   Financial Resource Strain: Not on file  Food Insecurity: Not on file  Transportation Needs: Not on file  Physical Activity: Not on file  Stress: Not on file  Social Connections: Not on file    Tobacco Counseling Counseling given: Not Answered   Clinical Intake:                 Diabetic? no         Activities of Daily Living No flowsheet data found.  Patient Care Team: Kerri PerchesSimpson, Margaret E, MD as PCP - Assunta GamblesGeneral Nesi, Marc, MD (Inactive) as Attending Physician (Urology) Jethro BolusShapiro, Mark, MD as Attending Physician (Ophthalmology) Jonelle SidleMcDowell, Samuel G,  MD as Consulting Physician (Cardiology) Carole Civil, MD as Consulting Physician (Orthopedic Surgery)  Indicate any recent Medical Services you may have received from other than [facility] providers in the past year (date may be approximate).     Assessment:   This is a routine wellness examination for Rose [name].  Hearing/Vision screen No exam data present  Dietary issues and exercise activities discussed:    Goals    . DIET - EAT MORE FRUITS AND VEGETABLES    . Increase physical activity      Depression Screen PHQ 2/9 Scores 06/10/2020 12/09/2019 07/03/2019 06/12/2019 06/10/2019 12/31/2018 05/28/2018  PHQ - 2 Score 0 0 0 0 0 0 1  PHQ- 9 Score - - - - - - -    Fall Risk Fall Risk  06/10/2020 12/09/2019  07/03/2019 06/12/2019 06/10/2019  Falls in the past year? 0 0 1 0 0  Comment - - - - -  Number falls in past yr: 0 0 0 0 0  Injury with Fall? 0 0 1 0 0  Risk for fall due to : No Fall Risks History of fall(s);Impaired balance/gait - - -  Follow up Falls evaluation completed Falls evaluation completed;Education provided;Falls prevention discussed - - -    FALL RISK PREVENTION PERTAINING TO THE HOME:  Any stairs in or around the home? No  If so, are there any without handrails? No  Home free of loose throw rugs in walkways, pet beds, electrical cords, etc? Yes  Adequate lighting in your home to reduce risk of falls? Yes   ASSISTIVE DEVICES UTILIZED TO PREVENT FALLS:  Life alert? No  Use of a cane, walker or w/c? No  Grab bars in the bathroom? Yes  Shower chair or bench in shower? Yes  Elevated toilet seat or a handicapped toilet? No   TIMED UP AND GO:  Was the test performed? No .  Length of time to ambulate n/a   Cognitive Function: MMSE - Mini Mental State Exam 06/10/2019 05/28/2018 10/19/2016  Not completed: - Refused -  Orientation to time 0 0 5  Orientation to Place 5 1 3   Registration 0 0 2  Attention/ Calculation 3 0 1  Recall 0 0 2  Language- name 2 objects 2 0 1  Language- repeat 0 1 1  Language- follow 3 step command 1 1 3   Language- read & follow direction 0 0 1  Write a sentence 1 0 1  Copy design 1 0 0  Total score 13 3 20      6CIT Screen 06/12/2019 05/28/2018  What Year? 4 points 4 points  What month? 3 points 3 points  What time? 0 points 0 points  Count back from 20 4 points 4 points  Months in reverse 4 points 4 points  Repeat phrase 10 points 10 points  Total Score 25 25    Immunizations Immunization History  Administered Date(s) Administered  . Fluad Quad(high Dose 65+) 01/02/2019, 01/31/2020  . Influenza Split 01/08/2014  . Influenza Whole 02/14/2007, 01/18/2009, 02/11/2010  . Influenza,inj,Quad PF,6+ Mos 01/07/2015, 02/12/2018  .  Influenza-Unspecified 02/15/2017  . Moderna Sars-Covid-2 Vaccination 05/29/2019, 06/26/2019, 03/09/2020  . Pneumococcal Conjugate-13 11/25/2013  . Pneumococcal Polysaccharide-23 03/30/2004  . Td 01/21/2004  . Zoster 07/12/2006    TDAP status: Up to date  Flu Vaccine status: Up to date  Pneumococcal vaccine status: Up to date  Covid-19 vaccine status: Completed vaccines  Qualifies for Shingles Vaccine? Yes   Zostavax completed No   Shingrix  Completed?: No.    Education has been provided regarding the importance of this vaccine. Patient has been advised to call insurance company to determine out of pocket expense if they have not yet received this vaccine. Advised may also receive vaccine at local pharmacy or Health Dept. Verbalized acceptance and understanding.  Screening Tests Health Maintenance  Topic Date Due  . COLONOSCOPY (Pts 45-61yrs Insurance coverage will need to be confirmed)  09/07/2016  . TETANUS/TDAP  06/10/2022 (Originally 01/20/2014)  . INFLUENZA VACCINE  Completed  . DEXA SCAN  Completed  . COVID-19 Vaccine  Completed  . PNA vac Low Risk Adult  Completed    Health Maintenance  Health Maintenance Due  Topic Date Due  . COLONOSCOPY (Pts 45-65yrs Insurance coverage will need to be confirmed)  09/07/2016    Colorectal cancer screening: Type of screening: Colonoscopy. Completed 09/08/11. Repeat every 10 years  Mammogram status: No longer required due to over age 47.  Bone Density status: Completed 11/26/14. Results reflect: Bone density results: NORMAL. Repeat every 5 years.  Lung Cancer Screening: (Low Dose CT Chest recommended if Age 6-80 years, 30 pack-year currently smoking OR have quit w/in 15years.) does not qualify.   Lung Cancer Screening Referral: n/a  Additional Screening:  Hepatitis C Screening: does not qualify; Completed  Vision Screening: Recommended annual ophthalmology exams for early detection of glaucoma and other disorders of the eye. Is  the patient up to date with their annual eye exam?  Yes  Who is the provider or what is the name of the office in which the patient attends annual eye exams? [place] If pt is not established with a provider, would they like to be referred to a provider to establish care? No .   Dental Screening: Recommended annual dental exams for proper oral hygiene  Community Resource Referral / Chronic Care Management: CRR required this visit?  No   CCM required this visit?  No      Plan:     I have personally reviewed and noted the following in the patient's chart:   . Medical and social history . Use of alcohol, tobacco or illicit drugs  . Current medications and supplements . Functional ability and status . Nutritional status . Physical activity . Advanced directives . List of other physicians . Hospitalizations, surgeries, and ER visits in previous 12 months . Vitals . Screenings to include cognitive, depression, and falls . Referrals and appointments  In addition, I have reviewed and discussed with patient certain preventive protocols, quality metrics, and best practice recommendations. A written personalized care plan for preventive services as well as general preventive health recommendations were provided to patient.     Laretta Bolster, [place]   07/21/6576   Nurse Notes: AWV conducted by nurse in office by phone. Patient daughter gave consent to telehealth visit via audio, patient is non verbal. Patient daughter and patient both at their home at the time of this visit. Provider in the office at the time of this visit. Visit took 30 minutes to complete.

## 2020-07-16 ENCOUNTER — Ambulatory Visit (INDEPENDENT_AMBULATORY_CARE_PROVIDER_SITE_OTHER): Payer: Medicare Other | Admitting: Internal Medicine

## 2020-07-16 ENCOUNTER — Encounter: Payer: Self-pay | Admitting: Internal Medicine

## 2020-07-16 ENCOUNTER — Other Ambulatory Visit: Payer: Self-pay

## 2020-07-16 VITALS — BP 135/86 | HR 63 | Resp 18 | Ht 65.0 in | Wt 142.1 lb

## 2020-07-16 DIAGNOSIS — G301 Alzheimer's disease with late onset: Secondary | ICD-10-CM

## 2020-07-16 DIAGNOSIS — F5104 Psychophysiologic insomnia: Secondary | ICD-10-CM

## 2020-07-16 DIAGNOSIS — M1712 Unilateral primary osteoarthritis, left knee: Secondary | ICD-10-CM | POA: Diagnosis not present

## 2020-07-16 DIAGNOSIS — F0281 Dementia in other diseases classified elsewhere with behavioral disturbance: Secondary | ICD-10-CM

## 2020-07-16 DIAGNOSIS — F02818 Dementia in other diseases classified elsewhere, unspecified severity, with other behavioral disturbance: Secondary | ICD-10-CM

## 2020-07-16 MED ORDER — DICLOFENAC SODIUM 1 % EX GEL: 4.0000 g | Freq: Four times a day (QID) | CUTANEOUS | 0 refills | Status: DC

## 2020-07-16 NOTE — Assessment & Plan Note (Addendum)
Voltaren gel to prevent potential GI issues (PUD) Tylenol PRN

## 2020-07-16 NOTE — Assessment & Plan Note (Signed)
On Restoril 7.5 mg QHS PRN

## 2020-07-16 NOTE — Patient Instructions (Signed)
Please apply Voltaren gel to the knee for pain relief. Okay to take Tylenol for pain/swelling, up to 3 times in a day.  Please continue to take other medications as prescribed in assisted living facility.

## 2020-07-16 NOTE — Assessment & Plan Note (Signed)
On Donepezil Progressive dementia Dependent for ADLs Discussed about Assisted living facility placement, patient and family in agreement. Paperwork filled and provided.

## 2020-07-16 NOTE — Progress Notes (Addendum)
Established Patient Office Visit  Subjective:  Patient ID: Rose [name], female    DOB: [birth year]  Age: 86 y.o. MRN: [id number]  CC:  Chief Complaint  Patient presents with  . Follow-up    Follow up FL2 to be filled out for [place] left knee has been hurting pt for a little bit     HPI Rose [name] presents for follow up of her chronic medical conditions and concern for memory problem. Her sister is present during the visit.  Sister mentions that the patient has been more confused lately and has not been taking her medications and threw them recently. She has been forgetting family members and it has become difficult for her other sister to keep an eye on her. She has tendency for getting lost in the neighborhood. She gets upset when she is told to take medicines. They have decided for the patient to live in assisted living facility for her safety, which was advised to them in previous visits as well.  She c/o left knee pain, which is chronic, sharp and constant. Not on any medications for it currently.  Past Medical History:  Diagnosis Date  . Chronic anxiety   . Dyslipidemia   . Hypothyroidism   . Palpitations    Frequent PACs by Holter monitor June 2011    Past Surgical History:  Procedure Laterality Date  . ABDOMINAL HYSTERECTOMY    . APPENDECTOMY    . CHOLECYSTECTOMY    . COLONOSCOPY  09/08/2011   Procedure: COLONOSCOPY;  Surgeon: Rogene Houston, MD;  Location: [facility];  Service: Endoscopy;  Laterality: N/A;  2:10  . PARTIAL HYSTERECTOMY      Family History  Problem Relation Age of Onset  . Colon cancer Mother   . Colon cancer Father   . Heart disease Father   . Cancer Brother 109       prostate   . Hypertension Sister   . Hypertension Sister     Social History   Socioeconomic History  . Marital status: Widowed    Spouse name: Not on file  . Number of children: 0  . Years of education: 12+  . Highest education level: Associate degree:  occupational, Hotel manager, or vocational program  Occupational History  . Occupation: retired   Tobacco Use  . Smoking status: Never Smoker  . Smokeless tobacco: Never Used  Substance and Sexual Activity  . Alcohol use: No  . Drug use: No  . Sexual activity: Not Currently  Other Topics Concern  . Not on file  Social History Narrative   Sister from up [place] is currently living with her    Social Determinants of Health   Financial Resource Strain: Low Risk   . Difficulty of Paying Living Expenses: Not hard at all  Food Insecurity: No Food Insecurity  . Worried About Charity fundraiser in the Last Year: Never true  . Ran Out of Food in the Last Year: Never true  Transportation Needs: No Transportation Needs  . Lack of Transportation (Medical): No  . Lack of Transportation (Non-Medical): No  Physical Activity: Sufficiently Active  . Days of Exercise per Week: 7 days  . Minutes of Exercise per Session: 60 min  Stress: No Stress Concern Present  . Feeling of Stress : Not at all  Social Connections: Socially Isolated  . Frequency of Communication with Friends and Family: Never  . Frequency of Social Gatherings with Friends and Family: More than three times a  week  . Attends Religious Services: Never  . Active Member of Clubs or Organizations: No  . Attends Archivist Meetings: Never  . Marital Status: Widowed  Intimate Partner Violence: Not At Risk  . Fear of Current or Ex-Partner: No  . Emotionally Abused: No  . Physically Abused: No  . Sexually Abused: No    Outpatient Medications Prior to Visit  Medication Sig Dispense Refill  . alendronate (FOSAMAX) 70 MG tablet Take 1 tablet (70 mg total) by mouth every 7 (seven) days. Take with a full glass of water on an empty stomach. 4 tablet 11  . Calcium 500-125 MG-UNIT TABS Take by mouth 3 (three) times daily.    . diphenhydramine-acetaminophen (TYLENOL PM) 25-500 MG TABS tablet Take 1 tablet by mouth at bedtime as  needed. 30 tablet 3  . donepezil (ARICEPT) 10 MG tablet Take 1 tablet (10 mg total) by mouth at bedtime. 90 tablet 3  . temazepam (RESTORIL) 7.5 MG capsule Take 1 capsule (7.5 mg total) by mouth at bedtime as needed for sleep. 30 capsule 3   No facility-administered medications prior to visit.    Allergies  Allergen Reactions  . Penicillins     ROS Review of Systems  Constitutional: Negative for chills and fever.  HENT: Negative for congestion, sinus pressure, sinus pain and sore throat.   Eyes: Negative for pain and discharge.  Respiratory: Negative for cough and shortness of breath.   Cardiovascular: Negative for chest pain and palpitations.  Gastrointestinal: Negative for abdominal pain, constipation, diarrhea, nausea and vomiting.  Endocrine: Negative for polydipsia and polyuria.  Genitourinary: Negative for dysuria and hematuria.  Musculoskeletal: Positive for arthralgias. Negative for neck pain and neck stiffness.  Skin: Negative for rash.  Neurological: Negative for dizziness and weakness.  Psychiatric/Behavioral: Positive for confusion, decreased concentration and sleep disturbance. Negative for agitation and behavioral problems.      Objective:    Physical Exam Vitals reviewed.  Constitutional:      General: She is not in acute distress.    Appearance: She is not diaphoretic.  HENT:     Head: Normocephalic and atraumatic.     Nose: Nose normal. No congestion.     Mouth/Throat:     Mouth: Mucous membranes are moist.     Pharynx: No posterior oropharyngeal erythema.  Eyes:     General: No scleral icterus.    Extraocular Movements: Extraocular movements intact.     Pupils: Pupils are equal, round, and reactive to light.  Cardiovascular:     Rate and Rhythm: Normal rate and regular rhythm.     Pulses: Normal pulses.     Heart sounds: Normal heart sounds. No murmur heard.   Pulmonary:     Breath sounds: Normal breath sounds. No wheezing or rales.  Abdominal:      Palpations: Abdomen is soft.     Tenderness: There is no abdominal tenderness.  Musculoskeletal:     Cervical back: Neck supple. No tenderness.     Right lower leg: No edema.     Left lower leg: No edema.  Skin:    General: Skin is warm.     Findings: No rash.  Neurological:     General: No focal deficit present.     Mental Status: She is alert. Mental status is at baseline.     Comments: A&O X 1  Psychiatric:        Mood and Affect: Mood normal.  Behavior: Behavior normal.      BP 135/86 (BP Location: Right Arm, Patient Position: Sitting, Cuff Size: Normal)   Pulse 63   Resp 18   Ht 5\' 5"  (1.651 m)   Wt 142 lb 1.9 oz (64.5 kg)   SpO2 96%   BMI 23.65 kg/m  Wt Readings from Last 3 Encounters:  07/16/20 142 lb 1.9 oz (64.5 kg)  06/12/20 141 lb (64 kg)  06/10/20 138 lb (62.6 kg)     Health Maintenance Due  Topic Date Due  . COLONOSCOPY (Pts 45-39yrs Insurance coverage will need to be confirmed)  09/07/2016    There are no preventive care reminders to display for this patient.  Lab Results  Component Value Date   TSH 8.180 (H) 06/04/2020   Lab Results  Component Value Date   WBC 3.9 06/04/2020   HGB 14.3 06/04/2020   HCT 43.0 06/04/2020   MCV 89 06/04/2020   PLT 226 06/04/2020   Lab Results  Component Value Date   NA 139 06/04/2020   K 4.6 06/04/2020   CO2 26 06/04/2020   GLUCOSE 93 06/04/2020   BUN 12 06/04/2020   CREATININE 0.74 06/04/2020   BILITOT 0.5 06/04/2020   ALKPHOS 120 06/04/2020   AST 30 06/04/2020   ALT 27 06/04/2020   PROT 6.7 06/04/2020   ALBUMIN 4.5 06/04/2020   CALCIUM 10.0 06/04/2020   Lab Results  Component Value Date   CHOL 229 (H) 06/04/2020   Lab Results  Component Value Date   HDL 96 06/04/2020   Lab Results  Component Value Date   LDLCALC 115 (H) 06/04/2020   Lab Results  Component Value Date   TRIG 108 06/04/2020   Lab Results  Component Value Date   CHOLHDL 2.4 06/04/2020   No results found for:  HGBA1C    Assessment & Plan:   Problem List Items Addressed This Visit      Nervous and Auditory   Dementia with behavioral disturbance ([place]) - Primary    On Donepezil Progressive dementia Dependent for ADLs Discussed about Assisted living facility placement, patient and family in agreement. Paperwork filled and provided.        Musculoskeletal and Integument   Primary osteoarthritis of left knee    Voltaren gel to prevent potential GI issues (PUD) Tylenol PRN      Relevant Medications   diclofenac Sodium (VOLTAREN) 1 % GEL     Other   Insomnia    On Restoril 7.5 mg QHS PRN         Meds ordered this encounter  Medications  . diclofenac Sodium (VOLTAREN) 1 % GEL    Sig: Apply 4 g topically 4 (four) times daily.    Dispense:  4 g    Refill:  0    Follow-up: Return in about 3 months (around 10/16/2020).    Lindell Spar, MD

## 2020-07-23 ENCOUNTER — Ambulatory Visit: Payer: Medicare Other | Admitting: Internal Medicine

## 2020-07-24 ENCOUNTER — Other Ambulatory Visit: Payer: Self-pay | Admitting: Internal Medicine

## 2020-07-24 ENCOUNTER — Telehealth: Payer: Self-pay | Admitting: *Deleted

## 2020-07-24 DIAGNOSIS — F0281 Dementia in other diseases classified elsewhere with behavioral disturbance: Secondary | ICD-10-CM

## 2020-07-24 DIAGNOSIS — F02818 Dementia in other diseases classified elsewhere, unspecified severity, with other behavioral disturbance: Secondary | ICD-10-CM

## 2020-07-24 MED ORDER — QUETIAPINE FUMARATE 25 MG PO TABS: 25.0000 mg | ORAL_TABLET | Freq: Every day | ORAL | 0 refills | Status: DC | PRN

## 2020-07-24 NOTE — Telephone Encounter (Signed)
Pt was saw by dr patel last she goes into assisted living facility on Monday but sister Rose [name] says she is so agitated and and needs something to calm her down until she gets into the facility on Monday. Please advise and call Arch[facility] Lomeli if this is sent in

## 2020-07-24 NOTE — Telephone Encounter (Signed)
Sister notified.

## 2020-07-24 NOTE — Telephone Encounter (Signed)
Sent Seroquel PRN for agitation.

## 2020-07-28 DIAGNOSIS — F419 Anxiety disorder, unspecified: Secondary | ICD-10-CM | POA: Diagnosis not present

## 2020-07-28 DIAGNOSIS — E039 Hypothyroidism, unspecified: Secondary | ICD-10-CM | POA: Diagnosis not present

## 2020-07-28 DIAGNOSIS — W19XXXA Unspecified fall, initial encounter: Secondary | ICD-10-CM | POA: Diagnosis not present

## 2020-07-28 DIAGNOSIS — F0281 Dementia in other diseases classified elsewhere with behavioral disturbance: Secondary | ICD-10-CM | POA: Diagnosis not present

## 2020-07-28 DIAGNOSIS — G301 Alzheimer's disease with late onset: Secondary | ICD-10-CM | POA: Diagnosis not present

## 2020-07-28 DIAGNOSIS — M1712 Unilateral primary osteoarthritis, left knee: Secondary | ICD-10-CM | POA: Diagnosis not present

## 2020-07-28 DIAGNOSIS — M810 Age-related osteoporosis without current pathological fracture: Secondary | ICD-10-CM | POA: Diagnosis not present

## 2020-07-29 DIAGNOSIS — E559 Vitamin D deficiency, unspecified: Secondary | ICD-10-CM | POA: Diagnosis not present

## 2020-07-29 DIAGNOSIS — M170 Bilateral primary osteoarthritis of knee: Secondary | ICD-10-CM | POA: Diagnosis not present

## 2020-07-29 DIAGNOSIS — M810 Age-related osteoporosis without current pathological fracture: Secondary | ICD-10-CM | POA: Diagnosis not present

## 2020-07-29 DIAGNOSIS — G4700 Insomnia, unspecified: Secondary | ICD-10-CM | POA: Diagnosis not present

## 2020-07-29 DIAGNOSIS — L988 Other specified disorders of the skin and subcutaneous tissue: Secondary | ICD-10-CM | POA: Diagnosis not present

## 2020-07-29 DIAGNOSIS — F0391 Unspecified dementia with behavioral disturbance: Secondary | ICD-10-CM | POA: Diagnosis not present

## 2020-07-29 DIAGNOSIS — K219 Gastro-esophageal reflux disease without esophagitis: Secondary | ICD-10-CM | POA: Diagnosis not present

## 2020-07-30 DIAGNOSIS — G301 Alzheimer's disease with late onset: Secondary | ICD-10-CM | POA: Diagnosis not present

## 2020-07-30 DIAGNOSIS — M1712 Unilateral primary osteoarthritis, left knee: Secondary | ICD-10-CM | POA: Diagnosis not present

## 2020-07-30 DIAGNOSIS — F0281 Dementia in other diseases classified elsewhere with behavioral disturbance: Secondary | ICD-10-CM | POA: Diagnosis not present

## 2020-07-30 DIAGNOSIS — M810 Age-related osteoporosis without current pathological fracture: Secondary | ICD-10-CM | POA: Diagnosis not present

## 2020-07-30 DIAGNOSIS — F419 Anxiety disorder, unspecified: Secondary | ICD-10-CM | POA: Diagnosis not present

## 2020-07-30 DIAGNOSIS — E039 Hypothyroidism, unspecified: Secondary | ICD-10-CM | POA: Diagnosis not present

## 2020-07-31 DIAGNOSIS — E039 Hypothyroidism, unspecified: Secondary | ICD-10-CM | POA: Diagnosis not present

## 2020-07-31 DIAGNOSIS — F0281 Dementia in other diseases classified elsewhere with behavioral disturbance: Secondary | ICD-10-CM | POA: Diagnosis not present

## 2020-07-31 DIAGNOSIS — M1712 Unilateral primary osteoarthritis, left knee: Secondary | ICD-10-CM | POA: Diagnosis not present

## 2020-07-31 DIAGNOSIS — F419 Anxiety disorder, unspecified: Secondary | ICD-10-CM | POA: Diagnosis not present

## 2020-07-31 DIAGNOSIS — G301 Alzheimer's disease with late onset: Secondary | ICD-10-CM | POA: Diagnosis not present

## 2020-07-31 DIAGNOSIS — M810 Age-related osteoporosis without current pathological fracture: Secondary | ICD-10-CM | POA: Diagnosis not present

## 2020-08-03 DIAGNOSIS — M810 Age-related osteoporosis without current pathological fracture: Secondary | ICD-10-CM | POA: Diagnosis not present

## 2020-08-03 DIAGNOSIS — M1712 Unilateral primary osteoarthritis, left knee: Secondary | ICD-10-CM | POA: Diagnosis not present

## 2020-08-03 DIAGNOSIS — G301 Alzheimer's disease with late onset: Secondary | ICD-10-CM | POA: Diagnosis not present

## 2020-08-03 DIAGNOSIS — E039 Hypothyroidism, unspecified: Secondary | ICD-10-CM | POA: Diagnosis not present

## 2020-08-03 DIAGNOSIS — F0281 Dementia in other diseases classified elsewhere with behavioral disturbance: Secondary | ICD-10-CM | POA: Diagnosis not present

## 2020-08-03 DIAGNOSIS — F419 Anxiety disorder, unspecified: Secondary | ICD-10-CM | POA: Diagnosis not present

## 2020-08-04 DIAGNOSIS — M810 Age-related osteoporosis without current pathological fracture: Secondary | ICD-10-CM | POA: Diagnosis not present

## 2020-08-04 DIAGNOSIS — F419 Anxiety disorder, unspecified: Secondary | ICD-10-CM | POA: Diagnosis not present

## 2020-08-04 DIAGNOSIS — M1712 Unilateral primary osteoarthritis, left knee: Secondary | ICD-10-CM | POA: Diagnosis not present

## 2020-08-04 DIAGNOSIS — G301 Alzheimer's disease with late onset: Secondary | ICD-10-CM | POA: Diagnosis not present

## 2020-08-04 DIAGNOSIS — E039 Hypothyroidism, unspecified: Secondary | ICD-10-CM | POA: Diagnosis not present

## 2020-08-04 DIAGNOSIS — F0281 Dementia in other diseases classified elsewhere with behavioral disturbance: Secondary | ICD-10-CM | POA: Diagnosis not present

## 2020-08-05 DIAGNOSIS — E039 Hypothyroidism, unspecified: Secondary | ICD-10-CM | POA: Diagnosis not present

## 2020-08-05 DIAGNOSIS — F0281 Dementia in other diseases classified elsewhere with behavioral disturbance: Secondary | ICD-10-CM | POA: Diagnosis not present

## 2020-08-05 DIAGNOSIS — F419 Anxiety disorder, unspecified: Secondary | ICD-10-CM | POA: Diagnosis not present

## 2020-08-05 DIAGNOSIS — M1712 Unilateral primary osteoarthritis, left knee: Secondary | ICD-10-CM | POA: Diagnosis not present

## 2020-08-05 DIAGNOSIS — G301 Alzheimer's disease with late onset: Secondary | ICD-10-CM | POA: Diagnosis not present

## 2020-08-05 DIAGNOSIS — M810 Age-related osteoporosis without current pathological fracture: Secondary | ICD-10-CM | POA: Diagnosis not present

## 2020-08-06 DIAGNOSIS — G301 Alzheimer's disease with late onset: Secondary | ICD-10-CM | POA: Diagnosis not present

## 2020-08-06 DIAGNOSIS — E039 Hypothyroidism, unspecified: Secondary | ICD-10-CM | POA: Diagnosis not present

## 2020-08-06 DIAGNOSIS — M810 Age-related osteoporosis without current pathological fracture: Secondary | ICD-10-CM | POA: Diagnosis not present

## 2020-08-06 DIAGNOSIS — M1712 Unilateral primary osteoarthritis, left knee: Secondary | ICD-10-CM | POA: Diagnosis not present

## 2020-08-06 DIAGNOSIS — F0281 Dementia in other diseases classified elsewhere with behavioral disturbance: Secondary | ICD-10-CM | POA: Diagnosis not present

## 2020-08-06 DIAGNOSIS — F419 Anxiety disorder, unspecified: Secondary | ICD-10-CM | POA: Diagnosis not present

## 2020-08-07 DIAGNOSIS — E538 Deficiency of other specified B group vitamins: Secondary | ICD-10-CM | POA: Diagnosis not present

## 2020-08-07 DIAGNOSIS — M810 Age-related osteoporosis without current pathological fracture: Secondary | ICD-10-CM | POA: Diagnosis not present

## 2020-08-07 DIAGNOSIS — E039 Hypothyroidism, unspecified: Secondary | ICD-10-CM | POA: Diagnosis not present

## 2020-08-07 DIAGNOSIS — F0391 Unspecified dementia with behavioral disturbance: Secondary | ICD-10-CM | POA: Diagnosis not present

## 2020-08-09 DIAGNOSIS — E039 Hypothyroidism, unspecified: Secondary | ICD-10-CM | POA: Diagnosis not present

## 2020-08-09 DIAGNOSIS — F419 Anxiety disorder, unspecified: Secondary | ICD-10-CM | POA: Diagnosis not present

## 2020-08-09 DIAGNOSIS — F0281 Dementia in other diseases classified elsewhere with behavioral disturbance: Secondary | ICD-10-CM | POA: Diagnosis not present

## 2020-08-09 DIAGNOSIS — G301 Alzheimer's disease with late onset: Secondary | ICD-10-CM | POA: Diagnosis not present

## 2020-08-09 DIAGNOSIS — M810 Age-related osteoporosis without current pathological fracture: Secondary | ICD-10-CM | POA: Diagnosis not present

## 2020-08-09 DIAGNOSIS — M1712 Unilateral primary osteoarthritis, left knee: Secondary | ICD-10-CM | POA: Diagnosis not present

## 2020-08-10 DIAGNOSIS — E039 Hypothyroidism, unspecified: Secondary | ICD-10-CM | POA: Diagnosis not present

## 2020-08-10 DIAGNOSIS — F0281 Dementia in other diseases classified elsewhere with behavioral disturbance: Secondary | ICD-10-CM | POA: Diagnosis not present

## 2020-08-10 DIAGNOSIS — M810 Age-related osteoporosis without current pathological fracture: Secondary | ICD-10-CM | POA: Diagnosis not present

## 2020-08-10 DIAGNOSIS — F419 Anxiety disorder, unspecified: Secondary | ICD-10-CM | POA: Diagnosis not present

## 2020-08-10 DIAGNOSIS — M1712 Unilateral primary osteoarthritis, left knee: Secondary | ICD-10-CM | POA: Diagnosis not present

## 2020-08-10 DIAGNOSIS — G301 Alzheimer's disease with late onset: Secondary | ICD-10-CM | POA: Diagnosis not present

## 2020-08-11 DIAGNOSIS — G309 Alzheimer's disease, unspecified: Secondary | ICD-10-CM | POA: Diagnosis not present

## 2020-08-11 DIAGNOSIS — G4701 Insomnia due to medical condition: Secondary | ICD-10-CM | POA: Diagnosis not present

## 2020-08-11 DIAGNOSIS — F0280 Dementia in other diseases classified elsewhere without behavioral disturbance: Secondary | ICD-10-CM | POA: Diagnosis not present

## 2020-08-12 DIAGNOSIS — F419 Anxiety disorder, unspecified: Secondary | ICD-10-CM | POA: Diagnosis not present

## 2020-08-12 DIAGNOSIS — F0281 Dementia in other diseases classified elsewhere with behavioral disturbance: Secondary | ICD-10-CM | POA: Diagnosis not present

## 2020-08-12 DIAGNOSIS — G301 Alzheimer's disease with late onset: Secondary | ICD-10-CM | POA: Diagnosis not present

## 2020-08-12 DIAGNOSIS — M810 Age-related osteoporosis without current pathological fracture: Secondary | ICD-10-CM | POA: Diagnosis not present

## 2020-08-12 DIAGNOSIS — M1712 Unilateral primary osteoarthritis, left knee: Secondary | ICD-10-CM | POA: Diagnosis not present

## 2020-08-12 DIAGNOSIS — E039 Hypothyroidism, unspecified: Secondary | ICD-10-CM | POA: Diagnosis not present

## 2020-08-13 DIAGNOSIS — F0281 Dementia in other diseases classified elsewhere with behavioral disturbance: Secondary | ICD-10-CM | POA: Diagnosis not present

## 2020-08-13 DIAGNOSIS — G301 Alzheimer's disease with late onset: Secondary | ICD-10-CM | POA: Diagnosis not present

## 2020-08-13 DIAGNOSIS — E039 Hypothyroidism, unspecified: Secondary | ICD-10-CM | POA: Diagnosis not present

## 2020-08-13 DIAGNOSIS — M810 Age-related osteoporosis without current pathological fracture: Secondary | ICD-10-CM | POA: Diagnosis not present

## 2020-08-13 DIAGNOSIS — F419 Anxiety disorder, unspecified: Secondary | ICD-10-CM | POA: Diagnosis not present

## 2020-08-13 DIAGNOSIS — M1712 Unilateral primary osteoarthritis, left knee: Secondary | ICD-10-CM | POA: Diagnosis not present

## 2020-08-15 ENCOUNTER — Other Ambulatory Visit: Payer: Self-pay | Admitting: Internal Medicine

## 2020-08-15 DIAGNOSIS — F0281 Dementia in other diseases classified elsewhere with behavioral disturbance: Secondary | ICD-10-CM

## 2020-08-15 DIAGNOSIS — G301 Alzheimer's disease with late onset: Secondary | ICD-10-CM

## 2020-08-17 DIAGNOSIS — M810 Age-related osteoporosis without current pathological fracture: Secondary | ICD-10-CM | POA: Diagnosis not present

## 2020-08-17 DIAGNOSIS — M1712 Unilateral primary osteoarthritis, left knee: Secondary | ICD-10-CM | POA: Diagnosis not present

## 2020-08-17 DIAGNOSIS — F4322 Adjustment disorder with anxiety: Secondary | ICD-10-CM | POA: Diagnosis not present

## 2020-08-17 DIAGNOSIS — F0281 Dementia in other diseases classified elsewhere with behavioral disturbance: Secondary | ICD-10-CM | POA: Diagnosis not present

## 2020-08-17 DIAGNOSIS — E039 Hypothyroidism, unspecified: Secondary | ICD-10-CM | POA: Diagnosis not present

## 2020-08-17 DIAGNOSIS — F419 Anxiety disorder, unspecified: Secondary | ICD-10-CM | POA: Diagnosis not present

## 2020-08-17 DIAGNOSIS — G301 Alzheimer's disease with late onset: Secondary | ICD-10-CM | POA: Diagnosis not present

## 2020-08-19 DIAGNOSIS — G301 Alzheimer's disease with late onset: Secondary | ICD-10-CM | POA: Diagnosis not present

## 2020-08-19 DIAGNOSIS — M810 Age-related osteoporosis without current pathological fracture: Secondary | ICD-10-CM | POA: Diagnosis not present

## 2020-08-19 DIAGNOSIS — F419 Anxiety disorder, unspecified: Secondary | ICD-10-CM | POA: Diagnosis not present

## 2020-08-19 DIAGNOSIS — M1712 Unilateral primary osteoarthritis, left knee: Secondary | ICD-10-CM | POA: Diagnosis not present

## 2020-08-19 DIAGNOSIS — E039 Hypothyroidism, unspecified: Secondary | ICD-10-CM | POA: Diagnosis not present

## 2020-08-19 DIAGNOSIS — F0281 Dementia in other diseases classified elsewhere with behavioral disturbance: Secondary | ICD-10-CM | POA: Diagnosis not present

## 2020-08-24 DIAGNOSIS — M810 Age-related osteoporosis without current pathological fracture: Secondary | ICD-10-CM | POA: Diagnosis not present

## 2020-08-24 DIAGNOSIS — E039 Hypothyroidism, unspecified: Secondary | ICD-10-CM | POA: Diagnosis not present

## 2020-08-24 DIAGNOSIS — F419 Anxiety disorder, unspecified: Secondary | ICD-10-CM | POA: Diagnosis not present

## 2020-08-24 DIAGNOSIS — F0281 Dementia in other diseases classified elsewhere with behavioral disturbance: Secondary | ICD-10-CM | POA: Diagnosis not present

## 2020-08-24 DIAGNOSIS — M1712 Unilateral primary osteoarthritis, left knee: Secondary | ICD-10-CM | POA: Diagnosis not present

## 2020-08-24 DIAGNOSIS — G301 Alzheimer's disease with late onset: Secondary | ICD-10-CM | POA: Diagnosis not present

## 2020-08-25 DIAGNOSIS — M1712 Unilateral primary osteoarthritis, left knee: Secondary | ICD-10-CM | POA: Diagnosis not present

## 2020-08-25 DIAGNOSIS — F0281 Dementia in other diseases classified elsewhere with behavioral disturbance: Secondary | ICD-10-CM | POA: Diagnosis not present

## 2020-08-25 DIAGNOSIS — G301 Alzheimer's disease with late onset: Secondary | ICD-10-CM | POA: Diagnosis not present

## 2020-08-26 DIAGNOSIS — M1712 Unilateral primary osteoarthritis, left knee: Secondary | ICD-10-CM | POA: Diagnosis not present

## 2020-08-26 DIAGNOSIS — M810 Age-related osteoporosis without current pathological fracture: Secondary | ICD-10-CM | POA: Diagnosis not present

## 2020-08-26 DIAGNOSIS — G301 Alzheimer's disease with late onset: Secondary | ICD-10-CM | POA: Diagnosis not present

## 2020-08-26 DIAGNOSIS — G4700 Insomnia, unspecified: Secondary | ICD-10-CM | POA: Diagnosis not present

## 2020-08-26 DIAGNOSIS — K219 Gastro-esophageal reflux disease without esophagitis: Secondary | ICD-10-CM | POA: Diagnosis not present

## 2020-08-26 DIAGNOSIS — L988 Other specified disorders of the skin and subcutaneous tissue: Secondary | ICD-10-CM | POA: Diagnosis not present

## 2020-08-26 DIAGNOSIS — E559 Vitamin D deficiency, unspecified: Secondary | ICD-10-CM | POA: Diagnosis not present

## 2020-08-26 DIAGNOSIS — F0281 Dementia in other diseases classified elsewhere with behavioral disturbance: Secondary | ICD-10-CM | POA: Diagnosis not present

## 2020-08-27 DIAGNOSIS — M1712 Unilateral primary osteoarthritis, left knee: Secondary | ICD-10-CM | POA: Diagnosis not present

## 2020-08-27 DIAGNOSIS — W19XXXA Unspecified fall, initial encounter: Secondary | ICD-10-CM | POA: Diagnosis not present

## 2020-08-27 DIAGNOSIS — G301 Alzheimer's disease with late onset: Secondary | ICD-10-CM | POA: Diagnosis not present

## 2020-08-27 DIAGNOSIS — F419 Anxiety disorder, unspecified: Secondary | ICD-10-CM | POA: Diagnosis not present

## 2020-08-27 DIAGNOSIS — M810 Age-related osteoporosis without current pathological fracture: Secondary | ICD-10-CM | POA: Diagnosis not present

## 2020-08-27 DIAGNOSIS — E039 Hypothyroidism, unspecified: Secondary | ICD-10-CM | POA: Diagnosis not present

## 2020-08-27 DIAGNOSIS — F0281 Dementia in other diseases classified elsewhere with behavioral disturbance: Secondary | ICD-10-CM | POA: Diagnosis not present

## 2020-08-31 DIAGNOSIS — F419 Anxiety disorder, unspecified: Secondary | ICD-10-CM | POA: Diagnosis not present

## 2020-08-31 DIAGNOSIS — E039 Hypothyroidism, unspecified: Secondary | ICD-10-CM | POA: Diagnosis not present

## 2020-08-31 DIAGNOSIS — M1712 Unilateral primary osteoarthritis, left knee: Secondary | ICD-10-CM | POA: Diagnosis not present

## 2020-08-31 DIAGNOSIS — M810 Age-related osteoporosis without current pathological fracture: Secondary | ICD-10-CM | POA: Diagnosis not present

## 2020-08-31 DIAGNOSIS — F0281 Dementia in other diseases classified elsewhere with behavioral disturbance: Secondary | ICD-10-CM | POA: Diagnosis not present

## 2020-08-31 DIAGNOSIS — G301 Alzheimer's disease with late onset: Secondary | ICD-10-CM | POA: Diagnosis not present

## 2020-09-08 DIAGNOSIS — G4700 Insomnia, unspecified: Secondary | ICD-10-CM | POA: Diagnosis not present

## 2020-09-08 DIAGNOSIS — F0281 Dementia in other diseases classified elsewhere with behavioral disturbance: Secondary | ICD-10-CM | POA: Diagnosis not present

## 2020-09-08 DIAGNOSIS — F4322 Adjustment disorder with anxiety: Secondary | ICD-10-CM | POA: Diagnosis not present

## 2020-09-08 DIAGNOSIS — G301 Alzheimer's disease with late onset: Secondary | ICD-10-CM | POA: Diagnosis not present

## 2020-09-14 DIAGNOSIS — B351 Tinea unguium: Secondary | ICD-10-CM | POA: Diagnosis not present

## 2020-09-14 DIAGNOSIS — L851 Acquired keratosis [keratoderma] palmaris et plantaris: Secondary | ICD-10-CM | POA: Diagnosis not present

## 2020-09-14 DIAGNOSIS — M79675 Pain in left toe(s): Secondary | ICD-10-CM | POA: Diagnosis not present

## 2020-09-14 DIAGNOSIS — M79674 Pain in right toe(s): Secondary | ICD-10-CM | POA: Diagnosis not present

## 2020-09-16 DIAGNOSIS — M1712 Unilateral primary osteoarthritis, left knee: Secondary | ICD-10-CM | POA: Diagnosis not present

## 2020-09-16 DIAGNOSIS — E559 Vitamin D deficiency, unspecified: Secondary | ICD-10-CM | POA: Diagnosis not present

## 2020-09-16 DIAGNOSIS — F0281 Dementia in other diseases classified elsewhere with behavioral disturbance: Secondary | ICD-10-CM | POA: Diagnosis not present

## 2020-09-16 DIAGNOSIS — K219 Gastro-esophageal reflux disease without esophagitis: Secondary | ICD-10-CM | POA: Diagnosis not present

## 2020-09-16 DIAGNOSIS — L988 Other specified disorders of the skin and subcutaneous tissue: Secondary | ICD-10-CM | POA: Diagnosis not present

## 2020-09-16 DIAGNOSIS — M810 Age-related osteoporosis without current pathological fracture: Secondary | ICD-10-CM | POA: Diagnosis not present

## 2020-09-16 DIAGNOSIS — G301 Alzheimer's disease with late onset: Secondary | ICD-10-CM | POA: Diagnosis not present

## 2020-09-23 DIAGNOSIS — Z20822 Contact with and (suspected) exposure to covid-19: Secondary | ICD-10-CM | POA: Diagnosis not present

## 2020-09-23 DIAGNOSIS — R895 Abnormal microbiological findings in specimens from other organs, systems and tissues: Secondary | ICD-10-CM | POA: Diagnosis not present

## 2020-10-06 DIAGNOSIS — G4701 Insomnia due to medical condition: Secondary | ICD-10-CM | POA: Diagnosis not present

## 2020-10-06 DIAGNOSIS — G301 Alzheimer's disease with late onset: Secondary | ICD-10-CM | POA: Diagnosis not present

## 2020-10-06 DIAGNOSIS — F0281 Dementia in other diseases classified elsewhere with behavioral disturbance: Secondary | ICD-10-CM | POA: Diagnosis not present

## 2020-10-12 DIAGNOSIS — F419 Anxiety disorder, unspecified: Secondary | ICD-10-CM

## 2020-10-12 DIAGNOSIS — W19XXXA Unspecified fall, initial encounter: Secondary | ICD-10-CM | POA: Diagnosis not present

## 2020-10-12 DIAGNOSIS — M810 Age-related osteoporosis without current pathological fracture: Secondary | ICD-10-CM

## 2020-10-12 DIAGNOSIS — E039 Hypothyroidism, unspecified: Secondary | ICD-10-CM | POA: Diagnosis not present

## 2020-10-12 DIAGNOSIS — F0281 Dementia in other diseases classified elsewhere with behavioral disturbance: Secondary | ICD-10-CM

## 2020-10-12 DIAGNOSIS — M1712 Unilateral primary osteoarthritis, left knee: Secondary | ICD-10-CM

## 2020-10-12 DIAGNOSIS — G301 Alzheimer's disease with late onset: Secondary | ICD-10-CM

## 2020-10-13 DIAGNOSIS — F4322 Adjustment disorder with anxiety: Secondary | ICD-10-CM | POA: Diagnosis not present

## 2020-10-14 ENCOUNTER — Ambulatory Visit: Payer: Medicare Other | Admitting: Orthopedic Surgery

## 2020-10-14 DIAGNOSIS — M170 Bilateral primary osteoarthritis of knee: Secondary | ICD-10-CM | POA: Diagnosis not present

## 2020-10-14 DIAGNOSIS — M810 Age-related osteoporosis without current pathological fracture: Secondary | ICD-10-CM | POA: Diagnosis not present

## 2020-10-14 DIAGNOSIS — L988 Other specified disorders of the skin and subcutaneous tissue: Secondary | ICD-10-CM | POA: Diagnosis not present

## 2020-10-14 DIAGNOSIS — E559 Vitamin D deficiency, unspecified: Secondary | ICD-10-CM | POA: Diagnosis not present

## 2020-10-14 DIAGNOSIS — K219 Gastro-esophageal reflux disease without esophagitis: Secondary | ICD-10-CM | POA: Diagnosis not present

## 2020-10-14 DIAGNOSIS — Z20822 Contact with and (suspected) exposure to covid-19: Secondary | ICD-10-CM | POA: Diagnosis not present

## 2020-10-14 DIAGNOSIS — F0281 Dementia in other diseases classified elsewhere with behavioral disturbance: Secondary | ICD-10-CM | POA: Diagnosis not present

## 2020-10-20 DIAGNOSIS — F4322 Adjustment disorder with anxiety: Secondary | ICD-10-CM | POA: Diagnosis not present

## 2020-10-22 ENCOUNTER — Ambulatory Visit (INDEPENDENT_AMBULATORY_CARE_PROVIDER_SITE_OTHER): Payer: Medicare Other | Admitting: Orthopedic Surgery

## 2020-10-22 ENCOUNTER — Encounter: Payer: Self-pay | Admitting: Orthopedic Surgery

## 2020-10-22 ENCOUNTER — Other Ambulatory Visit: Payer: Self-pay

## 2020-10-22 ENCOUNTER — Ambulatory Visit: Payer: Medicare Other | Admitting: Family Medicine

## 2020-10-22 DIAGNOSIS — M1712 Unilateral primary osteoarthritis, left knee: Secondary | ICD-10-CM

## 2020-10-22 NOTE — Progress Notes (Signed)
Chief Complaint  Patient presents with   Knee Pain    Left/ wants injection   A steroid injection was performed at left knee  using 1% plain Lidocaine and 40 mg of depomedrol. This was well tolerated.   Encounter Diagnosis  Name Primary?   Primary osteoarthritis of left knee Yes

## 2020-10-22 NOTE — Patient Instructions (Signed)

## 2020-10-23 DIAGNOSIS — E559 Vitamin D deficiency, unspecified: Secondary | ICD-10-CM | POA: Diagnosis not present

## 2020-11-11 DIAGNOSIS — F4322 Adjustment disorder with anxiety: Secondary | ICD-10-CM | POA: Diagnosis not present

## 2020-11-11 DIAGNOSIS — L988 Other specified disorders of the skin and subcutaneous tissue: Secondary | ICD-10-CM | POA: Diagnosis not present

## 2020-11-11 DIAGNOSIS — K219 Gastro-esophageal reflux disease without esophagitis: Secondary | ICD-10-CM | POA: Diagnosis not present

## 2020-11-11 DIAGNOSIS — G309 Alzheimer's disease, unspecified: Secondary | ICD-10-CM | POA: Diagnosis not present

## 2020-11-11 DIAGNOSIS — Z0000 Encounter for general adult medical examination without abnormal findings: Secondary | ICD-10-CM | POA: Diagnosis not present

## 2020-11-11 DIAGNOSIS — M810 Age-related osteoporosis without current pathological fracture: Secondary | ICD-10-CM | POA: Diagnosis not present

## 2020-11-11 DIAGNOSIS — E559 Vitamin D deficiency, unspecified: Secondary | ICD-10-CM | POA: Diagnosis not present

## 2020-11-11 DIAGNOSIS — M170 Bilateral primary osteoarthritis of knee: Secondary | ICD-10-CM | POA: Diagnosis not present

## 2020-11-12 DIAGNOSIS — F0281 Dementia in other diseases classified elsewhere with behavioral disturbance: Secondary | ICD-10-CM | POA: Diagnosis not present

## 2020-11-12 DIAGNOSIS — G301 Alzheimer's disease with late onset: Secondary | ICD-10-CM | POA: Diagnosis not present

## 2020-11-12 DIAGNOSIS — G4701 Insomnia due to medical condition: Secondary | ICD-10-CM | POA: Diagnosis not present

## 2020-11-23 DIAGNOSIS — M79675 Pain in left toe(s): Secondary | ICD-10-CM | POA: Diagnosis not present

## 2020-11-23 DIAGNOSIS — L851 Acquired keratosis [keratoderma] palmaris et plantaris: Secondary | ICD-10-CM | POA: Diagnosis not present

## 2020-11-23 DIAGNOSIS — B351 Tinea unguium: Secondary | ICD-10-CM | POA: Diagnosis not present

## 2020-11-23 DIAGNOSIS — M79674 Pain in right toe(s): Secondary | ICD-10-CM | POA: Diagnosis not present

## 2020-11-27 DIAGNOSIS — F4322 Adjustment disorder with anxiety: Secondary | ICD-10-CM | POA: Diagnosis not present

## 2020-12-01 DIAGNOSIS — G301 Alzheimer's disease with late onset: Secondary | ICD-10-CM | POA: Diagnosis not present

## 2020-12-01 DIAGNOSIS — F0281 Dementia in other diseases classified elsewhere with behavioral disturbance: Secondary | ICD-10-CM | POA: Diagnosis not present

## 2020-12-01 DIAGNOSIS — G4701 Insomnia due to medical condition: Secondary | ICD-10-CM | POA: Diagnosis not present

## 2020-12-08 ENCOUNTER — Ambulatory Visit: Payer: Medicare Other | Admitting: Family Medicine

## 2020-12-09 DIAGNOSIS — K219 Gastro-esophageal reflux disease without esophagitis: Secondary | ICD-10-CM | POA: Diagnosis not present

## 2020-12-09 DIAGNOSIS — G301 Alzheimer's disease with late onset: Secondary | ICD-10-CM | POA: Diagnosis not present

## 2020-12-09 DIAGNOSIS — F0281 Dementia in other diseases classified elsewhere with behavioral disturbance: Secondary | ICD-10-CM | POA: Diagnosis not present

## 2020-12-09 DIAGNOSIS — M810 Age-related osteoporosis without current pathological fracture: Secondary | ICD-10-CM | POA: Diagnosis not present

## 2020-12-09 DIAGNOSIS — E559 Vitamin D deficiency, unspecified: Secondary | ICD-10-CM | POA: Diagnosis not present

## 2020-12-09 DIAGNOSIS — M170 Bilateral primary osteoarthritis of knee: Secondary | ICD-10-CM | POA: Diagnosis not present

## 2020-12-09 DIAGNOSIS — L988 Other specified disorders of the skin and subcutaneous tissue: Secondary | ICD-10-CM | POA: Diagnosis not present

## 2020-12-15 DIAGNOSIS — F4322 Adjustment disorder with anxiety: Secondary | ICD-10-CM | POA: Diagnosis not present

## 2020-12-23 DIAGNOSIS — Z23 Encounter for immunization: Secondary | ICD-10-CM | POA: Diagnosis not present

## 2021-01-05 DIAGNOSIS — G301 Alzheimer's disease with late onset: Secondary | ICD-10-CM | POA: Diagnosis not present

## 2021-01-05 DIAGNOSIS — F0281 Dementia in other diseases classified elsewhere with behavioral disturbance: Secondary | ICD-10-CM | POA: Diagnosis not present

## 2021-01-05 DIAGNOSIS — G4701 Insomnia due to medical condition: Secondary | ICD-10-CM | POA: Diagnosis not present

## 2021-01-06 DIAGNOSIS — F0281 Dementia in other diseases classified elsewhere with behavioral disturbance: Secondary | ICD-10-CM | POA: Diagnosis not present

## 2021-01-06 DIAGNOSIS — G301 Alzheimer's disease with late onset: Secondary | ICD-10-CM | POA: Diagnosis not present

## 2021-01-06 DIAGNOSIS — K219 Gastro-esophageal reflux disease without esophagitis: Secondary | ICD-10-CM | POA: Diagnosis not present

## 2021-01-06 DIAGNOSIS — M170 Bilateral primary osteoarthritis of knee: Secondary | ICD-10-CM | POA: Diagnosis not present

## 2021-01-06 DIAGNOSIS — M810 Age-related osteoporosis without current pathological fracture: Secondary | ICD-10-CM | POA: Diagnosis not present

## 2021-01-06 DIAGNOSIS — E559 Vitamin D deficiency, unspecified: Secondary | ICD-10-CM | POA: Diagnosis not present

## 2021-01-15 DIAGNOSIS — E559 Vitamin D deficiency, unspecified: Secondary | ICD-10-CM | POA: Diagnosis not present

## 2021-01-20 DIAGNOSIS — M170 Bilateral primary osteoarthritis of knee: Secondary | ICD-10-CM | POA: Diagnosis not present

## 2021-01-20 DIAGNOSIS — F0280 Dementia in other diseases classified elsewhere without behavioral disturbance: Secondary | ICD-10-CM | POA: Diagnosis not present

## 2021-01-20 DIAGNOSIS — E559 Vitamin D deficiency, unspecified: Secondary | ICD-10-CM | POA: Diagnosis not present

## 2021-01-20 DIAGNOSIS — K219 Gastro-esophageal reflux disease without esophagitis: Secondary | ICD-10-CM | POA: Diagnosis not present

## 2021-01-20 DIAGNOSIS — M810 Age-related osteoporosis without current pathological fracture: Secondary | ICD-10-CM | POA: Diagnosis not present

## 2021-01-20 DIAGNOSIS — G301 Alzheimer's disease with late onset: Secondary | ICD-10-CM | POA: Diagnosis not present

## 2021-02-01 DIAGNOSIS — L851 Acquired keratosis [keratoderma] palmaris et plantaris: Secondary | ICD-10-CM | POA: Diagnosis not present

## 2021-02-01 DIAGNOSIS — M79675 Pain in left toe(s): Secondary | ICD-10-CM | POA: Diagnosis not present

## 2021-02-01 DIAGNOSIS — M79674 Pain in right toe(s): Secondary | ICD-10-CM | POA: Diagnosis not present

## 2021-02-01 DIAGNOSIS — B351 Tinea unguium: Secondary | ICD-10-CM | POA: Diagnosis not present

## 2021-02-02 DIAGNOSIS — F0281 Dementia in other diseases classified elsewhere with behavioral disturbance: Secondary | ICD-10-CM | POA: Diagnosis not present

## 2021-02-02 DIAGNOSIS — G4701 Insomnia due to medical condition: Secondary | ICD-10-CM | POA: Diagnosis not present

## 2021-02-02 DIAGNOSIS — G301 Alzheimer's disease with late onset: Secondary | ICD-10-CM | POA: Diagnosis not present

## 2021-02-08 DIAGNOSIS — F4322 Adjustment disorder with anxiety: Secondary | ICD-10-CM | POA: Diagnosis not present

## 2021-02-09 DIAGNOSIS — Z23 Encounter for immunization: Secondary | ICD-10-CM | POA: Diagnosis not present

## 2021-02-10 DIAGNOSIS — M810 Age-related osteoporosis without current pathological fracture: Secondary | ICD-10-CM | POA: Diagnosis not present

## 2021-02-10 DIAGNOSIS — K219 Gastro-esophageal reflux disease without esophagitis: Secondary | ICD-10-CM | POA: Diagnosis not present

## 2021-02-10 DIAGNOSIS — F4322 Adjustment disorder with anxiety: Secondary | ICD-10-CM | POA: Diagnosis not present

## 2021-02-10 DIAGNOSIS — F0280 Dementia in other diseases classified elsewhere without behavioral disturbance: Secondary | ICD-10-CM | POA: Diagnosis not present

## 2021-02-10 DIAGNOSIS — G301 Alzheimer's disease with late onset: Secondary | ICD-10-CM | POA: Diagnosis not present

## 2021-02-10 DIAGNOSIS — E559 Vitamin D deficiency, unspecified: Secondary | ICD-10-CM | POA: Diagnosis not present

## 2021-02-10 DIAGNOSIS — M170 Bilateral primary osteoarthritis of knee: Secondary | ICD-10-CM | POA: Diagnosis not present

## 2021-02-10 DIAGNOSIS — L988 Other specified disorders of the skin and subcutaneous tissue: Secondary | ICD-10-CM | POA: Diagnosis not present

## 2021-02-26 DIAGNOSIS — Z23 Encounter for immunization: Secondary | ICD-10-CM | POA: Diagnosis not present

## 2021-03-04 DIAGNOSIS — F0280 Dementia in other diseases classified elsewhere without behavioral disturbance: Secondary | ICD-10-CM | POA: Diagnosis not present

## 2021-03-04 DIAGNOSIS — G301 Alzheimer's disease with late onset: Secondary | ICD-10-CM | POA: Diagnosis not present

## 2021-03-04 DIAGNOSIS — G4701 Insomnia due to medical condition: Secondary | ICD-10-CM | POA: Diagnosis not present

## 2021-03-10 DIAGNOSIS — G301 Alzheimer's disease with late onset: Secondary | ICD-10-CM | POA: Diagnosis not present

## 2021-03-10 DIAGNOSIS — K219 Gastro-esophageal reflux disease without esophagitis: Secondary | ICD-10-CM | POA: Diagnosis not present

## 2021-03-10 DIAGNOSIS — E559 Vitamin D deficiency, unspecified: Secondary | ICD-10-CM | POA: Diagnosis not present

## 2021-03-10 DIAGNOSIS — M810 Age-related osteoporosis without current pathological fracture: Secondary | ICD-10-CM | POA: Diagnosis not present

## 2021-03-10 DIAGNOSIS — M170 Bilateral primary osteoarthritis of knee: Secondary | ICD-10-CM | POA: Diagnosis not present

## 2021-03-10 DIAGNOSIS — G4701 Insomnia due to medical condition: Secondary | ICD-10-CM | POA: Diagnosis not present

## 2021-03-23 DIAGNOSIS — L600 Ingrowing nail: Secondary | ICD-10-CM | POA: Diagnosis not present

## 2021-03-23 DIAGNOSIS — M79675 Pain in left toe(s): Secondary | ICD-10-CM | POA: Diagnosis not present

## 2021-03-23 DIAGNOSIS — M79674 Pain in right toe(s): Secondary | ICD-10-CM | POA: Diagnosis not present

## 2021-03-26 DIAGNOSIS — F4322 Adjustment disorder with anxiety: Secondary | ICD-10-CM | POA: Diagnosis not present

## 2021-04-13 DIAGNOSIS — G301 Alzheimer's disease with late onset: Secondary | ICD-10-CM | POA: Diagnosis not present

## 2021-04-13 DIAGNOSIS — G4701 Insomnia due to medical condition: Secondary | ICD-10-CM | POA: Diagnosis not present

## 2021-04-13 DIAGNOSIS — F4322 Adjustment disorder with anxiety: Secondary | ICD-10-CM | POA: Diagnosis not present

## 2021-05-06 DIAGNOSIS — E559 Vitamin D deficiency, unspecified: Secondary | ICD-10-CM | POA: Diagnosis not present

## 2021-05-06 DIAGNOSIS — M810 Age-related osteoporosis without current pathological fracture: Secondary | ICD-10-CM | POA: Diagnosis not present

## 2021-05-06 DIAGNOSIS — G301 Alzheimer's disease with late onset: Secondary | ICD-10-CM | POA: Diagnosis not present

## 2021-05-06 DIAGNOSIS — K219 Gastro-esophageal reflux disease without esophagitis: Secondary | ICD-10-CM | POA: Diagnosis not present

## 2021-05-06 DIAGNOSIS — M170 Bilateral primary osteoarthritis of knee: Secondary | ICD-10-CM | POA: Diagnosis not present

## 2021-05-06 DIAGNOSIS — F0280 Dementia in other diseases classified elsewhere without behavioral disturbance: Secondary | ICD-10-CM | POA: Diagnosis not present

## 2021-05-13 DIAGNOSIS — G301 Alzheimer's disease with late onset: Secondary | ICD-10-CM | POA: Diagnosis not present

## 2021-05-13 DIAGNOSIS — F0280 Dementia in other diseases classified elsewhere without behavioral disturbance: Secondary | ICD-10-CM | POA: Diagnosis not present

## 2021-05-13 DIAGNOSIS — G4701 Insomnia due to medical condition: Secondary | ICD-10-CM | POA: Diagnosis not present

## 2021-05-14 DIAGNOSIS — Z79899 Other long term (current) drug therapy: Secondary | ICD-10-CM | POA: Diagnosis not present

## 2022-02-09 NOTE — Progress Notes (Signed)
No chief complaint on file.

## 2023-02-02 ENCOUNTER — Encounter (HOSPITAL_COMMUNITY): Payer: Self-pay | Admitting: Emergency Medicine

## 2023-02-02 ENCOUNTER — Emergency Department (HOSPITAL_COMMUNITY)
Admission: EM | Admit: 2023-02-02 | Discharge: 2023-02-02 | Disposition: A | Discharge status: Home / Self Care | Payer: Medicare Other

## 2023-02-02 ENCOUNTER — Emergency Department (HOSPITAL_COMMUNITY): Payer: Medicare Other

## 2023-02-02 ENCOUNTER — Other Ambulatory Visit: Payer: Self-pay

## 2023-02-02 DIAGNOSIS — F0390 Unspecified dementia without behavioral disturbance: Secondary | ICD-10-CM | POA: Insufficient documentation

## 2023-02-02 DIAGNOSIS — F0280 Dementia in other diseases classified elsewhere without behavioral disturbance: Secondary | ICD-10-CM | POA: Diagnosis not present

## 2023-02-02 DIAGNOSIS — R531 Weakness: Secondary | ICD-10-CM | POA: Diagnosis not present

## 2023-02-02 DIAGNOSIS — G309 Alzheimer's disease, unspecified: Secondary | ICD-10-CM | POA: Insufficient documentation

## 2023-02-02 DIAGNOSIS — Z79899 Other long term (current) drug therapy: Secondary | ICD-10-CM | POA: Insufficient documentation

## 2023-02-02 DIAGNOSIS — M7989 Other specified soft tissue disorders: Secondary | ICD-10-CM | POA: Diagnosis not present

## 2023-02-02 DIAGNOSIS — F03C11 Unspecified dementia, severe, with agitation: Secondary | ICD-10-CM

## 2023-02-02 DIAGNOSIS — R5383 Other fatigue: Secondary | ICD-10-CM | POA: Diagnosis present

## 2023-02-02 LAB — CBC WITH DIFFERENTIAL/PLATELET
Abs Immature Granulocytes: 0.01 10*3/uL (ref 0.00–0.07)
Basophils Absolute: 0.1 10*3/uL (ref 0.0–0.1)
Basophils Relative: 1 %
Eosinophils Absolute: 0.2 10*3/uL (ref 0.0–0.5)
Eosinophils Relative: 4 %
HCT: 43.7 % (ref 36.0–46.0)
Hemoglobin: 14.5 g/dL (ref 12.0–15.0)
Immature Granulocytes: 0 %
Lymphocytes Relative: 39 %
Lymphs Abs: 2.3 10*3/uL (ref 0.7–4.0)
MCH: 30.1 pg (ref 26.0–34.0)
MCHC: 33.2 g/dL (ref 30.0–36.0)
MCV: 90.9 fL (ref 80.0–100.0)
Monocytes Absolute: 0.5 10*3/uL (ref 0.1–1.0)
Monocytes Relative: 9 %
Neutro Abs: 2.8 10*3/uL (ref 1.7–7.7)
Neutrophils Relative %: 47 %
Platelets: 242 10*3/uL (ref 150–400)
RBC: 4.81 MIL/uL (ref 3.87–5.11)
RDW: 13.4 % (ref 11.5–15.5)
WBC: 5.9 10*3/uL (ref 4.0–10.5)
nRBC: 0 % (ref 0.0–0.2)

## 2023-02-02 LAB — URINALYSIS, ROUTINE W REFLEX MICROSCOPIC
Bilirubin Urine: NEGATIVE
Glucose, UA: NEGATIVE mg/dL
Ketones, ur: NEGATIVE mg/dL
Leukocytes,Ua: NEGATIVE
Nitrite: POSITIVE — AB
Protein, ur: NEGATIVE mg/dL
Specific Gravity, Urine: 1.014 (ref 1.005–1.030)
pH: 6 (ref 5.0–8.0)

## 2023-02-02 LAB — BRAIN NATRIURETIC PEPTIDE: B Natriuretic Peptide: 21 pg/mL (ref 0.0–100.0)

## 2023-02-02 LAB — TROPONIN I (HIGH SENSITIVITY)
Troponin I (High Sensitivity): 16 ng/L (ref <18)
Troponin I (High Sensitivity): 15 ng/L (ref <18)

## 2023-02-02 LAB — COMPREHENSIVE METABOLIC PANEL
ALT: 21 U/L (ref 0–44)
AST: 30 U/L (ref 15–41)
Albumin: 4.2 g/dL (ref 3.5–5.0)
Alkaline Phosphatase: 108 U/L (ref 38–126)
Anion gap: 10 (ref 5–15)
BUN: 13 mg/dL (ref 8–23)
CO2: 30 mmol/L (ref 22–32)
Calcium: 10.8 mg/dL — ABNORMAL HIGH (ref 8.9–10.3)
Chloride: 101 mmol/L (ref 98–111)
Creatinine, Ser: 0.81 mg/dL (ref 0.44–1.00)
GFR, Estimated: >60 mL/min (ref >60)
Glucose, Bld: 102 mg/dL — ABNORMAL HIGH (ref 70–99)
Potassium: 3.4 mmol/L — ABNORMAL LOW (ref 3.5–5.1)
Sodium: 141 mmol/L (ref 135–145)
Total Bilirubin: 0.9 mg/dL (ref 0.3–1.2)
Total Protein: 7.4 g/dL (ref 6.5–8.1)

## 2023-02-02 MED ORDER — LORAZEPAM 2 MG/ML IJ SOLN
1.0000 mg | Freq: Once | INTRAMUSCULAR | Status: AC
  Administered 2023-02-02: 1 mg via INTRAMUSCULAR
  Filled 2023-02-02: qty 1

## 2023-02-02 NOTE — ED Notes (Signed)
Family called as was updated on patient status.

## 2023-02-02 NOTE — ED Triage Notes (Signed)
Facility called out due to patient being excessively sleepy. Facility reported pt was unable to ambulate today and normally can. Facility reported bilateral lower extremity swelling. Pt was combative with EMS staff.

## 2023-02-02 NOTE — ED Provider Notes (Cosign Needed Addendum)
[place] EMERGENCY DEPARTMENT AT [facility] Provider Note   CSN: [id number] Arrival date & time: 02/02/23  1253     History  Chief Complaint  Patient presents with   Fatigue   Leg Swelling    Rose [name] is a 88 y.o. female.  Patient was sent to the emergency department from [facility] facility.  Patient was reported to be sleepier than usual.  EMS reports that staff told them patient normally ambulates but that she was not ambulating today.  Patient has not had any fever or chills she has not had any vomiting or diarrhea.  Patient has been eating normally.  EMS reports patient has been started on a new medication to help her sleep.  Patient has a past medical history of Alzheimer's.  She is unable to provide any history.  The history is provided by the patient. No language interpreter was used.       Home Medications Prior to Admission medications   Medication Sig Start Date End Date Taking? Authorizing Provider  acetaminophen (TYLENOL) 500 MG tablet Take 500 mg by mouth every 8 (eight) hours as needed.    [provider]  alendronate (FOSAMAX) 70 MG tablet Take 1 tablet (70 mg total) by mouth every 7 (seven) days. Take with a full glass of water on an empty stomach. 12/31/18   Kerri Perches, MD  Calcium 500-125 MG-UNIT TABS Take by mouth 3 (three) times daily.    [provider]  Cholecalciferol (VITAMIN D) 50 MCG (2000 UT) CAPS Take by mouth.    [provider]  diclofenac Sodium (VOLTAREN) 1 % GEL Apply 4 g topically 4 (four) times daily. 07/16/20   Anabel Halon, MD  diphenhydramine-acetaminophen (TYLENOL PM) 25-500 MG TABS tablet Take 1 tablet by mouth at bedtime as needed. 01/24/19   Kerri Perches, MD  donepezil (ARICEPT) 10 MG tablet Take 1 tablet (10 mg total) by mouth at bedtime. 12/31/18   Kerri Perches, MD  MELATONIN MAXIMUM STRENGTH 5 MG TABS Take 5 mg by mouth at bedtime. 07/30/20   [provider]  Omega-3 1000 MG CAPS Take 1 capsule by mouth.    [provider]      Allergies    Penicillins    Review of Systems   Review of Systems  Unable to perform ROS: Dementia    Physical Exam Updated Vital Signs BP (!) 156/75 (BP Location: Left Arm)   Pulse (!) 56   Temp (!) 97.5 F (36.4 C) (Axillary)   Resp 15   Ht 5\' 5"  (1.651 m)   Wt 64 kg   SpO2 98%   BMI 23.48 kg/m  Physical Exam Vitals and nursing note reviewed.  Constitutional:      Appearance: She is well-developed.  HENT:     Head: Normocephalic.     Mouth/Throat:     Mouth: Mucous membranes are moist.  Cardiovascular:     Rate and Rhythm: Normal rate.  Pulmonary:     Effort: Pulmonary effort is normal.  Abdominal:     General: There is no distension.  Musculoskeletal:        General: Normal range of motion.     Cervical back: Normal range of motion.  Skin:    General: Skin is warm.  Neurological:     General: No focal deficit present.     Mental Status: She is alert and oriented to person, place, and time.  Comments: Patient got out of bed and ambulated without assistance.  Patient required redirection by staff to get her to return to the bed. Patient trying to leave the facility,  Psychiatric:        Mood and Affect: Mood normal.     ED Results / Procedures / Treatments   Labs (all labs ordered are listed, but only abnormal results are displayed) Labs Reviewed  CBC WITH DIFFERENTIAL/PLATELET  COMPREHENSIVE METABOLIC PANEL  URINALYSIS, ROUTINE W REFLEX MICROSCOPIC  BRAIN NATRIURETIC PEPTIDE  TROPONIN I (HIGH SENSITIVITY)    EKG None  Radiology No results found.  Procedures Procedures    Medications Ordered in ED Medications  LORazepam (ATIVAN) injection 1 mg (1 mg Intramuscular Given 02/02/23 1459)    ED Course/ Medical Decision Making/ A&P                                 Medical Decision Making Patient reported to have been sleepy and not ambulating  today  Amount and/or Complexity of Data Reviewed Independent Historian: EMS    Details: Patient's family member here to visit with patient she reports patient is at her baseline Labs: ordered. Decision-making details documented in ED Course.    Details: Labs ordered reviewed and interpreted.  Hemoglobin is normal no elevation in white blood cell count chemistries are normal.  UA negative. Radiology: ordered.  Risk Prescription drug management.   Patient agitated and staff is unable to redirect her.  Patient given Ativan 1 mg IM.  A sitter is placed at bedside for patient's safety.    BNP ordered to assess pt for heart failure and volume overload as a cause of her weakness    Final Clinical Impression(s) / ED Diagnoses Final diagnoses:  Weakness    Rx / DC Orders ED Discharge Orders     None     An After Visit Summary was printed and given to the patient.     Osie Cheeks 02/02/23 1805    Bethann Berkshire, MD 02/04/23 [address], PA-C 02/15/23 1527    Bethann Berkshire, MD 02/16/23 [phone]

## 2023-02-02 NOTE — ED Notes (Signed)
Pt refusing and combative when attempting vitals.

## 2023-02-02 NOTE — Discharge Instructions (Addendum)
Return if any problems.

## 2023-02-02 NOTE — ED Notes (Signed)
Pt is not redirectable. Pt refuses to return to her bed and is unable to communicate effectively. She cannot follow commands. Multiple attempts for other measures have been exhausted. RN and staff are unable to maintain pt safety without the assistance of a 1:1 live sitter.

## 2023-02-02 NOTE — ED Notes (Signed)
[facility] came to [facility] and took patient home.

## 2023-02-03 ENCOUNTER — Emergency Department (HOSPITAL_COMMUNITY): Payer: Medicare Other

## 2023-02-03 ENCOUNTER — Encounter (HOSPITAL_COMMUNITY): Payer: Self-pay | Admitting: *Deleted

## 2023-02-03 ENCOUNTER — Other Ambulatory Visit: Payer: Self-pay

## 2023-02-03 ENCOUNTER — Emergency Department (HOSPITAL_COMMUNITY)
Admission: EM | Admit: 2023-02-03 | Discharge: 2023-02-04 | Disposition: A | Discharge status: Home / Self Care | Payer: Medicare Other | Attending: Emergency Medicine | Admitting: Emergency Medicine

## 2023-02-03 DIAGNOSIS — Z1152 Encounter for screening for COVID-19: Secondary | ICD-10-CM | POA: Diagnosis not present

## 2023-02-03 DIAGNOSIS — I700 Atherosclerosis of aorta: Secondary | ICD-10-CM | POA: Diagnosis not present

## 2023-02-03 DIAGNOSIS — F03C0 Unspecified dementia, severe, without behavioral disturbance, psychotic disturbance, mood disturbance, and anxiety: Secondary | ICD-10-CM | POA: Insufficient documentation

## 2023-02-03 DIAGNOSIS — K5900 Constipation, unspecified: Secondary | ICD-10-CM | POA: Insufficient documentation

## 2023-02-03 DIAGNOSIS — W19XXXA Unspecified fall, initial encounter: Secondary | ICD-10-CM | POA: Diagnosis not present

## 2023-02-03 DIAGNOSIS — R1110 Vomiting, unspecified: Secondary | ICD-10-CM | POA: Diagnosis present

## 2023-02-03 DIAGNOSIS — K5730 Diverticulosis of large intestine without perforation or abscess without bleeding: Secondary | ICD-10-CM | POA: Insufficient documentation

## 2023-02-03 DIAGNOSIS — I6782 Cerebral ischemia: Secondary | ICD-10-CM | POA: Diagnosis not present

## 2023-02-03 HISTORY — DX: Unspecified dementia, unspecified severity, without behavioral disturbance, psychotic disturbance, mood disturbance, and anxiety: F03.90

## 2023-02-03 LAB — CBC WITH DIFFERENTIAL/PLATELET
Abs Immature Granulocytes: 0.05 10*3/uL (ref 0.00–0.07)
Basophils Absolute: 0 10*3/uL (ref 0.0–0.1)
Basophils Relative: 1 %
Eosinophils Absolute: 0 10*3/uL (ref 0.0–0.5)
Eosinophils Relative: 0 %
HCT: 42.5 % (ref 36.0–46.0)
Hemoglobin: 14.1 g/dL (ref 12.0–15.0)
Immature Granulocytes: 1 %
Lymphocytes Relative: 13 %
Lymphs Abs: 0.9 10*3/uL (ref 0.7–4.0)
MCH: 30 pg (ref 26.0–34.0)
MCHC: 33.2 g/dL (ref 30.0–36.0)
MCV: 90.4 fL (ref 80.0–100.0)
Monocytes Absolute: 0.2 10*3/uL (ref 0.1–1.0)
Monocytes Relative: 3 %
Neutro Abs: 6 10*3/uL (ref 1.7–7.7)
Neutrophils Relative %: 82 %
Platelets: 218 10*3/uL (ref 150–400)
RBC: 4.7 MIL/uL (ref 3.87–5.11)
RDW: 13.2 % (ref 11.5–15.5)
WBC: 7.2 10*3/uL (ref 4.0–10.5)
nRBC: 0 % (ref 0.0–0.2)

## 2023-02-03 LAB — COMPREHENSIVE METABOLIC PANEL
ALT: 20 U/L (ref 0–44)
AST: 31 U/L (ref 15–41)
Albumin: 4.1 g/dL (ref 3.5–5.0)
Alkaline Phosphatase: 100 U/L (ref 38–126)
Anion gap: 14 (ref 5–15)
BUN: 11 mg/dL (ref 8–23)
CO2: 27 mmol/L (ref 22–32)
Calcium: 9.7 mg/dL (ref 8.9–10.3)
Chloride: 100 mmol/L (ref 98–111)
Creatinine, Ser: 0.76 mg/dL (ref 0.44–1.00)
GFR, Estimated: >60 mL/min (ref >60)
Glucose, Bld: 125 mg/dL — ABNORMAL HIGH (ref 70–99)
Potassium: 4.1 mmol/L (ref 3.5–5.1)
Sodium: 141 mmol/L (ref 135–145)
Total Bilirubin: 0.9 mg/dL (ref 0.3–1.2)
Total Protein: 7.4 g/dL (ref 6.5–8.1)

## 2023-02-03 LAB — LACTIC ACID, PLASMA: Lactic Acid, Venous: 1.3 mmol/L (ref 0.5–1.9)

## 2023-02-03 LAB — SARS CORONAVIRUS 2 BY RT PCR: SARS Coronavirus 2 by RT PCR: NEGATIVE

## 2023-02-03 LAB — LIPASE, BLOOD: Lipase: 23 U/L (ref 11–51)

## 2023-02-03 MED ORDER — PANTOPRAZOLE SODIUM 40 MG IV SOLR: 40.0000 mg | Freq: Once | INTRAVENOUS | Status: DC

## 2023-02-03 MED ORDER — POLYETHYLENE GLYCOL 3350 17 G PO PACK: 17.0000 g | PACK | Freq: Every day | ORAL | 0 refills | Status: AC

## 2023-02-03 MED ORDER — IOHEXOL 300 MG/ML  SOLN
100.0000 mL | Freq: Once | INTRAMUSCULAR | Status: AC | PRN
  Administered 2023-02-03: 100 mL via INTRAVENOUS

## 2023-02-03 MED ORDER — FLEET ENEMA RE ENEM: 1.0000 | ENEMA | Freq: Once | RECTAL | Status: DC

## 2023-02-03 NOTE — ED Triage Notes (Signed)
Pt BIB [facility] for emesis, reported pt seen last night and discharged back to facility [facility].  pt with severe dementia.

## 2023-02-03 NOTE — Discharge Instructions (Signed)
It was a pleasure taking care of you today.  You were seen for vomiting.  You are not vomiting anymore, your blood work was reassuring.  Your CAT scan showed constipation.  We are prescribing MiraLAX daily.  Follow-up closely with your regular doctor, come back to the ER for new or worsening symptoms.

## 2023-02-03 NOTE — ED Notes (Signed)
Patient cleaned up with new gown and linens. Bed alarm activated.

## 2023-02-03 NOTE — ED Provider Notes (Signed)
Patient handed from [place], [place]. Patient had been stable for discharge but appears to have fallen in room while unsupervised. Imaging ordered for evaluation given age. No acute or apparent injury noted on reexamination.  Physical Exam  BP (!) 130/92   Pulse 71   Temp 98 F (36.7 C)   Resp 18   SpO2 94%   Physical Exam  Procedures  Procedures  ED Course / MDM   Clinical Course as of 02/03/23 2303  Fri Feb 03, 2023  1932 Was up for discharge, I was called to the room to assess her as she had curled over the railing on her bed and was found on the floor.  There is no signs of trauma on exam.  No lacerations or hematomas her head, no tenderness to her neck, no injuries to extremities.  She is at her baseline mental status which is unfortunately disoriented due to her dementia.  Will obtain head CT, C-spine CT and pelvis x-ray to rule out any traumatic injuries from her fall. [CB]    Clinical Course User Index [CB] Ma Rings, PA-C   Medical Decision Making Amount and/or Complexity of Data Reviewed Labs: ordered. Radiology: ordered.  Risk OTC drugs. Prescription drug management.   Patient is a handoff from [place], [place]. Imaging ordered following fall while patient was waiting for discharge. No acute injury noted. Imaging including pelvic xray, CT head and CT cervical spine ordered for evaluation. Imaging negative. Will plan on discharging patient back home.       Smitty Knudsen, PA-C 02/03/23 2305    Bethann Berkshire, MD 02/04/23 [phone]

## 2023-02-03 NOTE — ED Provider Notes (Cosign Needed Addendum)
This patient's care was assumed by me at shift change. She is here for vomiting, found to be constipated. Pending enema and PO challenge.    Patient was re-evaluated by me as well. I discussed their result with them and plan is for discharge with your Lasix daily.  Nurses had a very difficult time getting EKG, patient having to be restrained, I did speak with patient's sister and plan for enema but on further evaluation, and discussion with Dr. Estell Harpin as well, I do not feel safe or appropriate at this time to have to restrain patient, we can give her MiraLAX to go back to the nursing home.  She tolerated water and applesauce in the ED today.  Before patient's transport got here she crawled over bed railing and fell on the ground.  Imaging ordered, signed out to oncoming PA Ariel.  No external signs of trauma.  Patient moving all 4 extremities, no chest or abdominal tenderness, no neck or back tenderness.  Given her age will obtain imaging of her head, neck and pelvis   Ma Rings, PA-C 02/03/23 1919    Josem Kaufmann 02/03/23 Randel Pigg, MD 02/04/23 [phone]

## 2023-02-03 NOTE — ED Provider Notes (Signed)
[place] EMERGENCY DEPARTMENT AT [facility] Provider Note   CSN: [id number] Arrival date & time: 02/03/23  1053     History {Add pertinent medical, surgical, social history, OB history to HPI:1} Chief Complaint  Patient presents with   Emesis    Rose [name] is a 88 y.o. female.  HPI    88 year old female comes in with chief complaint of vomiting. Patient has history of severe dementia.  She comes to the emergency room with chief complaint of vomiting.  Patient unable to provide any meaningful history.  I called the nursing home, but there was no response from [place].  Home Medications Prior to Admission medications   Medication Sig Start Date End Date Taking? Authorizing Provider  acetaminophen (TYLENOL) 500 MG tablet Take 500 mg by mouth 3 (three) times daily.   Yes [provider]  alendronate (FOSAMAX) 70 MG tablet Take 1 tablet (70 mg total) by mouth every 7 (seven) days. Take with a full glass of water on an empty stomach. 12/31/18  Yes Kerri Perches, MD  Calcium 500-125 MG-UNIT TABS Take 1 tablet by mouth 3 (three) times daily.   Yes [provider]  donepezil (ARICEPT) 10 MG tablet Take 1 tablet (10 mg total) by mouth at bedtime. 12/31/18  Yes Kerri Perches, MD  famotidine (PEPCID) 20 MG tablet Take 20 mg by mouth daily. 01/19/23  Yes [provider]  LORazepam (ATIVAN) 0.5 MG tablet Take 0.25 mg by mouth in the morning. Take 0.25 mg (1/2 tablet) by mouth in the mornings and 0.25 mg (1/2 tablet) as needed once daily for agitation. 01/19/23  Yes [provider]  Melatonin 10 MG TABS Take 10 mg by mouth at bedtime. 07/30/20  Yes [provider]  mirtazapine (REMERON) 7.5 MG tablet Take 7.5 mg by mouth at bedtime. 01/31/23  Yes [provider]  Multiple Vitamin (MULTIVITAMIN PO) Take 1 tablet by mouth daily.   Yes [provider]  Omega-3 1000 MG CAPS Take 1,000 mg by mouth at bedtime.    Yes [provider]  ondansetron (ZOFRAN) 4 MG tablet Take 4 mg by mouth every 6 (six) hours as needed for nausea or vomiting.   Yes [provider]  VITAMIN D PO Take 5,000 Units by mouth daily.   Yes [provider]      Allergies    Penicillins    Review of Systems   Review of Systems  All other systems reviewed and are negative.   Physical Exam Updated Vital Signs BP (!) 164/70   Pulse 62   Temp 98 F (36.7 C) (Oral)   Resp 16   SpO2 97%  Physical Exam Vitals and nursing note reviewed.  Constitutional:      Appearance: She is well-developed.  HENT:     Head: Atraumatic.  Eyes:     Pupils: Pupils are equal, round, and reactive to light.  Cardiovascular:     Rate and Rhythm: Normal rate.  Pulmonary:     Effort: Pulmonary effort is normal.  Abdominal:     Palpations: Abdomen is soft.     Tenderness: There is abdominal tenderness. There is guarding. There is no rebound.  Musculoskeletal:     Cervical back: Normal range of motion and neck supple.  Skin:    General: Skin is warm and dry.  Neurological:     Mental Status: She is alert and oriented to person, place, and time.     ED  Results / Procedures / Treatments   Labs (all labs ordered are listed, but only abnormal results are displayed) Labs Reviewed  COMPREHENSIVE METABOLIC PANEL - Abnormal; Notable for the following components:      Result Value   Glucose, Bld 125 (*)    All other components within normal limits  SARS CORONAVIRUS 2 BY RT PCR  LIPASE, BLOOD  CBC WITH DIFFERENTIAL/PLATELET  LACTIC ACID, PLASMA    EKG None  Radiology CT ABDOMEN PELVIS W CONTRAST  Result Date: 02/03/2023 CLINICAL DATA:  Abdomen pain emesis EXAM: CT ABDOMEN AND PELVIS WITH CONTRAST TECHNIQUE: Multidetector CT imaging of the abdomen and pelvis was performed using the standard protocol following bolus administration of intravenous contrast. RADIATION DOSE REDUCTION: This exam was performed  according to the departmental dose-optimization program which includes automated exposure control, adjustment of the mA and/or kV according to patient size and/or use of iterative reconstruction technique. CONTRAST:  OMNIPAQUE IOHEXOL 300 MG/ML  SOLN COMPARISON:  None Available. FINDINGS: Lower chest: Lung bases demonstrate no consolidation or pleural effusion. Hepatobiliary: No focal liver abnormality is seen. Status post cholecystectomy. No biliary dilatation. Pancreas: Unremarkable. No pancreatic ductal dilatation or surrounding inflammatory changes. Spleen: Normal in size without focal abnormality. Adrenals/Urinary Tract: Adrenal glands are within normal limits. 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 Bladder unremarkable. No hydronephrosis Stomach/Bowel: Stomach nonenlarged. No dilated small bowel. No acute bowel wall thickening. Diverticular disease of the left colon without inflammation. Moderate rectal distension by feces. Vascular/Lymphatic: Mild aortic atherosclerosis. No aneurysm. No suspicious lymph nodes. Reproductive: Status post hysterectomy. No adnexal masses. Other: Negative for pelvic effusion or free air Musculoskeletal: Scoliosis and degenerative changes. No acute osseous abnormality. IMPRESSION: 1. No CT evidence for acute intra-abdominal or pelvic abnormality. 2. Diverticular disease of the left colon without inflammation. Moderate rectal distension by feces. 3. Aortic atherosclerosis. Aortic Atherosclerosis (ICD10-I70.0). Electronically Signed   By: Jasmine Pang M.D.   On: 02/03/2023 15:37   DG Chest Port 1 View  Result Date: 02/02/2023 CLINICAL DATA:  Weakness.  Excessive somnolence. EXAM: PORTABLE CHEST 1 VIEW COMPARISON:  05/12/2010 FINDINGS: Poor inspiration. Normal-sized heart. Interval small amount of linear density at the left lung base with minimal left pleural thickening or fluid. Otherwise, clear lungs. Unremarkable bones.  Cholecystectomy clips. IMPRESSION: Poor inspiration with mild left basilar subsegmental atelectasis or scarring and minimal left pleural thickening or fluid. Electronically Signed   By: Beckie Salts M.D.   On: 02/02/2023 17:04    Procedures Procedures  {Document cardiac monitor, telemetry assessment procedure when appropriate:1}  Medications Ordered in ED Medications  sodium phosphate (FLEET) enema 1 enema (has no administration in time range)  iohexol (OMNIPAQUE) 300 MG/ML solution 100 mL (100 mLs Intravenous Contrast Given 02/03/23 1421)    ED Course/ Medical Decision Making/ A&P   {   Click here for ABCD2, HEART and other calculatorsREFRESH Note before signing :1}                              Medical Decision Making Amount and/or Complexity of Data Reviewed Labs: ordered. Radiology: ordered.  Risk OTC drugs. Prescription drug management.  88 year old female comes in with chief complaint of vomiting.  Yesterday she was seen for weakness and leg pain.  She had cardiac enzymes, basic labs which were reassuring.  On exam today, patient noted to have lower quadrant abdominal tenderness.  Differential diagnosis considered for this visit  includes COVID-19, worsening dehydration due to poor p.o. intake, UTI.  We will get basic labs.  Will also get CT abdomen pelvis as patient did have abdominal tenderness on exam.  Final Clinical Impression(s) / ED Diagnoses Final diagnoses:  None    Rx / DC Orders ED Discharge Orders     None

## 2023-02-03 NOTE — ED Notes (Signed)
Patient transported to CT 

## 2023-02-03 NOTE — ED Notes (Signed)
Pt had one sip of water and two bites of applesauce. Pt refuses anymore.

## 2023-02-03 NOTE — ED Notes (Signed)
RN to room at 1925 to greet and assess patient. Patient found in the floor due to unwitnessed fall. EDP called to see patient and patient assessed.  Patient assisted back into the bed and vital signs updated. Patient denies any pain.

## 2023-02-06 ENCOUNTER — Emergency Department (HOSPITAL_COMMUNITY): Payer: Medicare Other

## 2023-02-06 ENCOUNTER — Emergency Department (HOSPITAL_COMMUNITY)
Admission: EM | Admit: 2023-02-06 | Discharge: 2023-02-07 | Disposition: A | Discharge status: Home / Self Care | Payer: Medicare Other | Attending: Emergency Medicine | Admitting: Emergency Medicine

## 2023-02-06 ENCOUNTER — Other Ambulatory Visit: Payer: Self-pay

## 2023-02-06 DIAGNOSIS — W19XXXA Unspecified fall, initial encounter: Secondary | ICD-10-CM | POA: Insufficient documentation

## 2023-02-06 DIAGNOSIS — R911 Solitary pulmonary nodule: Secondary | ICD-10-CM | POA: Diagnosis not present

## 2023-02-06 DIAGNOSIS — M791 Myalgia, unspecified site: Secondary | ICD-10-CM | POA: Insufficient documentation

## 2023-02-06 DIAGNOSIS — F0390 Unspecified dementia without behavioral disturbance: Secondary | ICD-10-CM | POA: Diagnosis not present

## 2023-02-06 NOTE — ED Triage Notes (Addendum)
Pt had unwitnessed fall at [place]. Pt not able to tell what hurts d/t dementia. Pt groans in pain when touch any part of body. No blood thinners.

## 2023-02-06 NOTE — Discharge Instructions (Addendum)
The lung nodule seen on the CT scan of the neck today in her upper lung.  She will need repeat CT imaging in about 3-6 months.

## 2023-02-06 NOTE — ED Provider Notes (Signed)
[place] EMERGENCY DEPARTMENT AT [facility] Provider Note   CSN: [id number] Arrival date & time: 02/06/23  2039     History  Chief Complaint  Patient presents with   Rose [name]    Rose [name] is a 88 y.o. female.  HPI 88 year old female with a history of dementia presents after a fall.  According to the nurse it was an unwitnessed fall.  Hard to tell if she injured anything in particular.  Sister at the bedside notes that she fell a few days ago and was in the ER.  Otherwise, patient seemed to be at her mental status baseline according to the sister.  She does not take blood thinners. No recent illness.  Home Medications Prior to Admission medications   Medication Sig Start Date End Date Taking? Authorizing Provider  acetaminophen (TYLENOL) 500 MG tablet Take 500 mg by mouth 3 (three) times daily.    [provider]  alendronate (FOSAMAX) 70 MG tablet Take 1 tablet (70 mg total) by mouth every 7 (seven) days. Take with a full glass of water on an empty stomach. 12/31/18   Kerri Perches, MD  Calcium 500-125 MG-UNIT TABS Take 1 tablet by mouth 3 (three) times daily.    [provider]  donepezil (ARICEPT) 10 MG tablet Take 1 tablet (10 mg total) by mouth at bedtime. 12/31/18   Kerri Perches, MD  famotidine (PEPCID) 20 MG tablet Take 20 mg by mouth daily. 01/19/23   [provider]  LORazepam (ATIVAN) 0.5 MG tablet Take 0.25 mg by mouth in the morning. Take 0.25 mg (1/2 tablet) by mouth in the mornings and 0.25 mg (1/2 tablet) as needed once daily for agitation. 01/19/23   [provider]  Melatonin 10 MG TABS Take 10 mg by mouth at bedtime. 07/30/20   [provider]  mirtazapine (REMERON) 7.5 MG tablet Take 7.5 mg by mouth at bedtime. 01/31/23   [provider]  Multiple Vitamin (MULTIVITAMIN PO) Take 1 tablet by mouth daily.    [provider]  Omega-3 1000 MG CAPS Take 1,000 mg by mouth at bedtime.     [provider]  ondansetron (ZOFRAN) 4 MG tablet Take 4 mg by mouth every 6 (six) hours as needed for nausea or vomiting.    [provider]  polyethylene glycol (MIRALAX) 17 g packet Take 17 g by mouth daily. 02/03/23   Carmel Sacramento A, PA-C  VITAMIN D PO Take 5,000 Units by mouth daily.    [provider]      Allergies    Penicillins    Review of Systems   Review of Systems  Unable to perform ROS: Dementia    Physical Exam Updated Vital Signs BP 129/69   Pulse 67   Temp 98.2 F (36.8 C)   Resp 19   SpO2 97%  Physical Exam Vitals and nursing note reviewed.  Constitutional:      Appearance: She is well-developed.  HENT:     Head: Normocephalic and atraumatic.  Cardiovascular:     Rate and Rhythm: Normal rate and regular rhythm.     Pulses:          Posterior tibial pulses are 2+ on the left side.     Heart sounds: Normal heart sounds.  Pulmonary:     Effort: Pulmonary effort is normal.     Breath sounds: Normal breath sounds.  Abdominal:     Palpations: Abdomen is soft.  Tenderness: There is no abdominal tenderness.  Musculoskeletal:     Comments: Patient remarks and pain when I try to move her left leg.  Seems to be coming from her knee though it is hard to significantly localize.  Later when asked her to move her leg she is able to flex her knee and her hip and does not seem to have any pain. Right leg has no issues with movement or tenderness No pain with moving her upper extremities or upper extremity trauma  Skin:    General: Skin is warm and dry.  Neurological:     Mental Status: She is alert. She is disoriented.     ED Results / Procedures / Treatments   Labs (all labs ordered are listed, but only abnormal results are displayed) Labs Reviewed - No data to display  EKG None  Radiology DG Knee Complete 4 Views Left  Result Date: 02/06/2023 CLINICAL DATA:  Fall EXAM: LEFT KNEE - COMPLETE 4+ VIEW COMPARISON:  09/04/2019  FINDINGS: No acute fracture or malalignment. Severe tricompartment arthritis of the knee, worst involving the lateral tibiofemoral joint space. No sizable knee effusion IMPRESSION: No acute osseous abnormality. Severe tricompartment arthritis. Electronically Signed   By: Jasmine Pang M.D.   On: 02/06/2023 23:21   CT Head Wo Contrast  Result Date: 02/06/2023 CLINICAL DATA:  Head trauma unwitnessed fall EXAM: CT HEAD WITHOUT CONTRAST CT CERVICAL SPINE WITHOUT CONTRAST TECHNIQUE: Multidetector CT imaging of the head and cervical spine was performed following the standard protocol without intravenous contrast. Multiplanar CT image reconstructions of the cervical spine were also generated. RADIATION DOSE REDUCTION: This exam was performed according to the departmental dose-optimization program which includes automated exposure control, adjustment of the mA and/or kV according to patient size and/or use of iterative reconstruction technique. COMPARISON:  CT brain and cervical spine 02/03/2023, cervical CT 06/12/2022 FINDINGS: CT HEAD FINDINGS Brain: No acute territorial infarction, hemorrhage or intracranial mass. Moderate atrophy. Moderate white matter hypodensity consistent with chronic small vessel ischemic change. More focal hypodensity within the left parietal white matter at the vertex probably a chronic white matter infarct. Stable ventricle size Vascular: No hyperdense vessels.  Carotid vascular calcification Skull: Normal. Negative for fracture or focal lesion. Sinuses/Orbits: No acute finding. Other: None CT CERVICAL SPINE FINDINGS Alignment: Straightening of the cervical spine. Trace retrolisthesis C4 on C5. Facet alignment is within normal limits Skull base and vertebrae: No acute fracture. No primary bone lesion or focal pathologic process. Soft tissues and spinal canal: No prevertebral fluid or swelling. No visible canal hematoma. Disc levels: Multilevel degenerative change. Advanced disc space  narrowing C4-C5 and C5-C6. Facet degenerative changes at multiple levels with foraminal narrowing. Upper chest: Irregular right apical opacity anteriorly with nodular component measuring 9 x 7 mm on series 4, image 87, appears slightly increased in size compared to CT from 2014 Other: None IMPRESSION: 1. No CT evidence for acute intracranial abnormality. Atrophy and chronic small vessel ischemic changes of the white matter. 2. Straightening of the cervical spine with degenerative changes. No acute osseous abnormality. 3. Irregular right apical opacity with nodular component measuring 9 x 7 mm, appears slightly increased in size compared to CT from 2014. While this could represent an area of pulmonary scarring, lung nodule is also possible. Chest CT follow-up in 3-6 months is recommended Electronically Signed   By: Jasmine Pang M.D.   On: 02/06/2023 23:20   CT Cervical Spine Wo Contrast  Result Date: 02/06/2023 CLINICAL DATA:  Head  trauma unwitnessed fall EXAM: CT HEAD WITHOUT CONTRAST CT CERVICAL SPINE WITHOUT CONTRAST TECHNIQUE: Multidetector CT imaging of the head and cervical spine was performed following the standard protocol without intravenous contrast. Multiplanar CT image reconstructions of the cervical spine were also generated. RADIATION DOSE REDUCTION: This exam was performed according to the departmental dose-optimization program which includes automated exposure control, adjustment of the mA and/or kV according to patient size and/or use of iterative reconstruction technique. COMPARISON:  CT brain and cervical spine 02/03/2023, cervical CT 06/12/2022 FINDINGS: CT HEAD FINDINGS Brain: No acute territorial infarction, hemorrhage or intracranial mass. Moderate atrophy. Moderate white matter hypodensity consistent with chronic small vessel ischemic change. More focal hypodensity within the left parietal white matter at the vertex probably a chronic white matter infarct. Stable ventricle size Vascular:  No hyperdense vessels.  Carotid vascular calcification Skull: Normal. Negative for fracture or focal lesion. Sinuses/Orbits: No acute finding. Other: None CT CERVICAL SPINE FINDINGS Alignment: Straightening of the cervical spine. Trace retrolisthesis C4 on C5. Facet alignment is within normal limits Skull base and vertebrae: No acute fracture. No primary bone lesion or focal pathologic process. Soft tissues and spinal canal: No prevertebral fluid or swelling. No visible canal hematoma. Disc levels: Multilevel degenerative change. Advanced disc space narrowing C4-C5 and C5-C6. Facet degenerative changes at multiple levels with foraminal narrowing. Upper chest: Irregular right apical opacity anteriorly with nodular component measuring 9 x 7 mm on series 4, image 87, appears slightly increased in size compared to CT from 2014 Other: None IMPRESSION: 1. No CT evidence for acute intracranial abnormality. Atrophy and chronic small vessel ischemic changes of the white matter. 2. Straightening of the cervical spine with degenerative changes. No acute osseous abnormality. 3. Irregular right apical opacity with nodular component measuring 9 x 7 mm, appears slightly increased in size compared to CT from 2014. While this could represent an area of pulmonary scarring, lung nodule is also possible. Chest CT follow-up in 3-6 months is recommended Electronically Signed   By: Jasmine Pang M.D.   On: 02/06/2023 23:20   DG Hip Unilat W or Wo Pelvis 2-3 Views Left  Result Date: 02/06/2023 CLINICAL DATA:  Fall EXAM: DG HIP (WITH OR WITHOUT PELVIS) 2-3V LEFT COMPARISON:  02/03/2023 FINDINGS: SI joints are non widened. Pubic symphysis and rami appear intact. No definitive fracture or malalignment. Mild hip degenerative change IMPRESSION: No acute osseous abnormality. Cross-sectional imaging may be performed if continued suspicion for pelvic fracture Electronically Signed   By: Jasmine Pang M.D.   On: 02/06/2023 23:12     Procedures Procedures    Medications Ordered in ED Medications - No data to display  ED Course/ Medical Decision Making/ A&P                                 Medical Decision Making Amount and/or Complexity of Data Reviewed Radiology: ordered and independent interpretation performed.    Details: No hip fracture   Patient with a recurrent fall.  Vitals are normal.  She has had blood work twice in the last few days that have been pretty much unremarkable.  I do not think repeat labs in the setting would be helpful.  Unclear why she is falling but there has been no other clear cause on previous workup.  Discussed with sister, I do not think further labs are needed.  Imaging is overall unremarkable.  No clear signs of any fracture or  head bleed.  She does have a lung nodule that can be followed up as an outpatient.  I tried to call the daughter but could not get in touch with her as she has already left.  Patient will be ambulated, care transferred to Dr. Blinda Leatherwood.        Final Clinical Impression(s) / ED Diagnoses Final diagnoses:  Fall, initial encounter  Lung nodule    Rx / DC Orders ED Discharge Orders     None         Pricilla Loveless, MD 02/07/23 0001

## 2023-02-07 ENCOUNTER — Emergency Department (HOSPITAL_COMMUNITY): Payer: Medicare Other

## 2023-02-07 DIAGNOSIS — M791 Myalgia, unspecified site: Secondary | ICD-10-CM | POA: Diagnosis not present

## 2023-02-07 NOTE — ED Notes (Signed)
All belongings and paperwork provided to transport.
